# Patient Record
Sex: Female | Born: 1945 | Race: White | Hispanic: No | Smoking: Former smoker
Health system: Southern US, Community
[De-identification: ages and names within clinical notes are randomized; demographics above are authoritative.]

## PROBLEM LIST (undated history)

## (undated) DIAGNOSIS — N189 Chronic kidney disease, unspecified: Secondary | ICD-10-CM

## (undated) DIAGNOSIS — I10 Essential (primary) hypertension: Secondary | ICD-10-CM

## (undated) DIAGNOSIS — E785 Hyperlipidemia, unspecified: Secondary | ICD-10-CM

## (undated) DIAGNOSIS — F32A Depression, unspecified: Secondary | ICD-10-CM

## (undated) DIAGNOSIS — F329 Major depressive disorder, single episode, unspecified: Secondary | ICD-10-CM

## (undated) DIAGNOSIS — E119 Type 2 diabetes mellitus without complications: Secondary | ICD-10-CM

## (undated) DIAGNOSIS — I2510 Atherosclerotic heart disease of native coronary artery without angina pectoris: Secondary | ICD-10-CM

## (undated) DIAGNOSIS — M1990 Unspecified osteoarthritis, unspecified site: Secondary | ICD-10-CM

## (undated) DIAGNOSIS — I219 Acute myocardial infarction, unspecified: Secondary | ICD-10-CM

## (undated) HISTORY — PX: OVARIAN CYST REMOVAL: SHX89

## (undated) HISTORY — PX: TUBAL LIGATION: SHX77

## (undated) HISTORY — DX: Essential (primary) hypertension: I10

## (undated) HISTORY — PX: EYE SURGERY: SHX253

## (undated) HISTORY — PX: ABDOMINAL HYSTERECTOMY: SHX81

## (undated) HISTORY — PX: CHOLECYSTECTOMY: SHX55

---

## 2000-12-23 DIAGNOSIS — I219 Acute myocardial infarction, unspecified: Secondary | ICD-10-CM

## 2000-12-23 HISTORY — DX: Acute myocardial infarction, unspecified: I21.9

## 2000-12-23 HISTORY — PX: CORONARY ARTERY BYPASS GRAFT: SHX141

## 2001-05-17 ENCOUNTER — Encounter (INDEPENDENT_AMBULATORY_CARE_PROVIDER_SITE_OTHER): Payer: Self-pay | Admitting: Specialist

## 2001-05-17 ENCOUNTER — Inpatient Hospital Stay (HOSPITAL_COMMUNITY): Admission: EM | Admit: 2001-05-17 | Discharge: 2001-05-26 | Payer: Self-pay

## 2001-05-19 ENCOUNTER — Encounter: Payer: Self-pay | Admitting: Cardiothoracic Surgery

## 2001-05-20 ENCOUNTER — Encounter: Payer: Self-pay | Admitting: Cardiothoracic Surgery

## 2001-05-21 ENCOUNTER — Encounter: Payer: Self-pay | Admitting: Cardiothoracic Surgery

## 2001-05-21 HISTORY — PX: CAROTID ENDARTERECTOMY: SUR193

## 2001-05-22 ENCOUNTER — Encounter: Payer: Self-pay | Admitting: Cardiothoracic Surgery

## 2001-05-23 ENCOUNTER — Encounter: Payer: Self-pay | Admitting: Cardiothoracic Surgery

## 2001-05-24 ENCOUNTER — Encounter: Payer: Self-pay | Admitting: Cardiothoracic Surgery

## 2001-05-25 ENCOUNTER — Encounter: Payer: Self-pay | Admitting: Cardiothoracic Surgery

## 2001-06-30 ENCOUNTER — Encounter (HOSPITAL_COMMUNITY): Admission: RE | Admit: 2001-06-30 | Discharge: 2001-07-21 | Payer: Self-pay | Admitting: Interventional Cardiology

## 2001-07-22 ENCOUNTER — Encounter (HOSPITAL_COMMUNITY): Admission: RE | Admit: 2001-07-22 | Discharge: 2001-10-20 | Payer: Self-pay | Admitting: Interventional Cardiology

## 2001-10-13 ENCOUNTER — Other Ambulatory Visit: Admission: RE | Admit: 2001-10-13 | Discharge: 2001-10-13 | Payer: Self-pay | Admitting: Obstetrics and Gynecology

## 2001-12-23 HISTORY — PX: CORONARY ANGIOPLASTY WITH STENT PLACEMENT: SHX49

## 2004-03-27 ENCOUNTER — Emergency Department (HOSPITAL_COMMUNITY): Admission: AD | Admit: 2004-03-27 | Discharge: 2004-03-27 | Payer: Self-pay | Admitting: Emergency Medicine

## 2004-06-20 ENCOUNTER — Ambulatory Visit (HOSPITAL_COMMUNITY): Admission: RE | Admit: 2004-06-20 | Discharge: 2004-06-20 | Payer: Self-pay | Admitting: Gastroenterology

## 2004-07-02 ENCOUNTER — Observation Stay (HOSPITAL_COMMUNITY): Admission: EM | Admit: 2004-07-02 | Discharge: 2004-07-02 | Payer: Self-pay

## 2004-07-12 ENCOUNTER — Inpatient Hospital Stay (HOSPITAL_BASED_OUTPATIENT_CLINIC_OR_DEPARTMENT_OTHER): Admission: RE | Admit: 2004-07-12 | Discharge: 2004-07-12 | Payer: Self-pay | Admitting: Interventional Cardiology

## 2004-07-18 ENCOUNTER — Ambulatory Visit (HOSPITAL_COMMUNITY): Admission: RE | Admit: 2004-07-18 | Discharge: 2004-07-19 | Payer: Self-pay | Admitting: Interventional Cardiology

## 2008-06-23 ENCOUNTER — Emergency Department (HOSPITAL_COMMUNITY): Admission: EM | Admit: 2008-06-23 | Discharge: 2008-06-23 | Payer: Self-pay | Admitting: Emergency Medicine

## 2011-05-10 NOTE — Cardiovascular Report (Signed)
Hailey Levine, Hailey Levine                      ACCOUNT NO.:  [id number]   MEDICAL RECORD NO.:  [id number]                   PATIENT TYPE:  OIB   LOCATION:  2899                                 FACILITY:  [facility]   PHYSICIAN:  Lyn Records III, M.D.            DATE OF BIRTH:  [birth year]   DATE OF PROCEDURE:  07/18/2004  DATE OF DISCHARGE:                              CARDIAC CATHETERIZATION   INDICATIONS:  Significant midcircumflex disease, history of prior coronary  artery bypass grafting with an abnormal Cardiolite demonstrating  inferoapical ischemia in the distribution of the obstructed vessel.   PROCEDURE:  1. Left heart catheterization.  2. Cypher-drug coated stent implantation in the midcircumflex.   DESCRIPTION OF PROCEDURE:  After informed consent, a 6 French sheath was  placed in the right femoral artery using a modified Seldinger technique.  A  6 French Judkins' #3 left guide catheter was used.  We attempted to use a  CLS guide but even a CLS 3.0 was too large for the patient's aortic root.  We gave a bolus of Angiomax followed by an infusion.  The patient had been  pretreated for four days with Plavix.  We then performed percutaneous  intervention using a Asahi medium wire.  Predilatation was performed with a  9 mm long by 2.5 mm diameter Maverick balloon.  We then had difficulty  getting a 2.5 by 13 Cypher stent to properly position in the circumflex.  We  had to insert a Asahi wire (prowater) which allowed a good further entry  into the tortuous distal vessel.  With this buddy wire, we were then able to  insert the stent and deploy it to 11 atmospheres x 2 with a good final  angiographic result and maintenance of some TIMI grade III flow.   Angiomax was discontinued postprocedure.   CONCLUSIONS:  Successful stent of the midcircumflex from 75% to 0% with  maintenance of TIMI grade III flow.   PLAN:  Angiomax has been discontinued.  Plavix will be continued for a  year.  Discharge on July 19, 2004.                                               Lesleigh Noe, M.D.    HWS/MEDQ  D:  07/18/2004  T:  07/18/2004  Job:  [id number]   cc:   Molly Maduro L. Foy [place], M.D.  [address] [place]  [place] [address]  Fax: [phone]   Mikey Bussing, M.D.  [address]  [place]  [place] [address]   Meredith Staggers, M.D.  [address]  [place]  [place] [address]  Fax: [phone]

## 2011-05-10 NOTE — Discharge Summary (Signed)
[place]. [facility]  Patient:    Hailey Levine, Hailey Levine                     MRN: [id number] Adm. Date:  05/17/01 Disc. Date: 05/26/01 Attending:  Mikey Bussing, M.D. Dictator:   Lissa Merlin, P.A. CC:         Quita Skye. Hart Rochester, M.D.  Marinda Elk, M.D.  Darci Needle, M.D.   Discharge Summary  DATE OF BIRTH:  1946/10/04  ADMISSION DIAGNOSES: 1. Unstable angina, class IV. 2. Non-Q wave myocardial infarction.  DISCHARGE DIAGNOSES: 1. Unstable angina, class IV. 2. Non-Q wave myocardial infarction. 3. Severe three vessel coronary artery disease, ejection fraction at 35%. 4. Congestive heart failure. 5. 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 6. Hypotension, resolved. 7. Mild postoperative anemia. 8. Mild peripheral vascular disease per Doppler.  PAST MEDICAL HISTORY: 1. (No previous history of CAD). 2. Hypertension. 3. Hyperlipidemia. 4. Hyperglycemia. 5. 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  BRIEF HISTORY:  The patient is a 65 year old obese white female with no previous history of CAD, who originally presented with symptoms of unstable angina to a walk-in clinic on May 17, 2001.  At the clinic she was noted to have EKG changes.  She was set up to be admitted to the hospital, but before she was admitted, her symptoms became worse and she was sent to the emergency room.  HOSPITAL COURSE:  At the emergency room, she was stabilized by cardiology. She was ruled in for non-Q wave MI.  She was stabilized on Integrilin, heparin, nitroglycerin, and aspirin.  She underwent a cardiac catheterization on May 18, 2001, and this showed severe three vessel coronary artery disease. Ejection fraction was 35% with severe anterior and apical hypokinesis.  CVTS was consulted.  She had pre-CABG Dopplers which showed severe left ICA stenosis greater than 80%.  Vascular service was also alert.   Dr. Arbie Cookey initially evaluated Ms. Schlesinger.  After consulting with Dr. Donata Clay who had also reviewed the catheterization data, it was agreed to proceed with combined CABG and left carotid endarterectomy.  Prior to surgery she had a two-dimensional echocardiogram to check ventricular function and to check for any mitral regurgitation.  There was none seen.  Meanwhile Dr. Hart Rochester also reviewed carotid Duplex scan and agreed to proceed with surgery.  Risk, benefits, details, and alternatives of combined procedure were discussed with the patient by Dr. Donata Clay and Dr. Hart Rochester and it was agreed to proceed.  She underwent combined left carotid endarterectomy with Dacron patch angioplasty and CABG x 3 on May 21, 2001.  There were no complications and she was taken to SICU in stable condition.  Postoperatively she did well with routine care. She was deemed suitable for transfer to unit 2000 on postoperative day #2, however, there were no beds.  She was transferred to unit 2000 on postoperative day #4.  She had been working with cardiac rehabilitation.  She remained neurologically intact.  By postoperative day #5 on May 26, 2001, she was doing very well, walking about the room.  She had no complaints and said she felt good.  She was afebrile, vital signs were stable.  She was in normal sinus rhythm.  She was almost back to her preoperative weight.  Physical examination was satisfactory.  Wounds were healing well.  She was neurologically intact.  Bowel and bladder were functioning well.  Discharge  instructions were reviewed and she was subsequently discharged home.  PROCEDURE: 1. Cardiac catheterization on May 18, 2001. 2. Pre-CABG Dopplers on May 18, 2001, showing severe greater than 80% left    ICA stenosis and mild lower extremity peripheral vascular disease. 3. Two-dimensional echocardiogram on May 19, 2001. 4. Combined left carotid endarterectomy and CABG x 3 with saphenuos vein graft     to diagonal, saphenuos vein graft to RCA, saphenuos vein graft to OM on May 21, 2001.  DISCHARGE INSTRUCTIONS:  She was told no driving, no lifting more than 10 pounds, no strenuous activity.  Use her incentive spirometer daily.  Walk daily.  She can shower.  She is to maintain a low fat, cholesterol, low sodium, no-concentrated sweets, diabetic diet.  She is to use soap and water on her wounds and to observe daily for increasing redness, swelling, drainage, or fever.  She was to get a chest x-ray at Dr. Lonn Georgia office and to bring it with her to see Dr. Donata Clay.  DISCHARGE MEDICATIONS:  1. Lopressor 50 mg 1/2 tablet p.o. b.i.d.  2. Lasix 40 mg one p.o. q.d. x 7 days.  3. KCL 20 mEq one p.o. q.d. x 7 days.  4. Enteric-coated aspirin 325 mg one p.o. q.d.  5. Niferex 150 one p.o. q.d.  6. Folate 1 mg one p.o. q.d.  7. Darvocet-N 100 one to two p.o. q.4-6h. p.r.n. for pain.  8. Premarin 0.625 mg one p.o. q.d.  9. Lipitor 10 mg one p.o. q.d. 10. Altace 2.5 mg one p.o. q.d.  ALLERGIES:  CODEINE causes nausea.  CONDITION ON DISCHARGE:  Stable.  FOLLOW-UP: 1. Dr. Katrinka Blazing two weeks after discharge. 2. Dr. Donata Clay three weeks after discharge. 3. Dr. Hart Rochester three weeks after discharge.  Office was to call with    appointments. 4. Staples and chest tube sutures out one week after discharge. DD:  06/22/01 TD:  06/22/01 Job: [id number]

## 2011-05-10 NOTE — Op Note (Signed)
[place]. [facility]  Patient:    Hailey Levine, Hailey Levine                     MRN: [id number] Proc. Date: 05/21/01 Attending:  Quita Skye. Hart Rochester, M.D.                           Operative Report  PREOPERATIVE DIAGNOSIS:  Severe asymptomatic left internal carotid stenosis and three-vessel coronary artery disease with previous unstable angina.  POSTOPERATIVE DIAGNOSIS:  Severe asymptomatic left internal carotid stenosis and three-vessel coronary artery disease with previous unstable angina.  OPERATION PERFORMED:  Left carotid endarterectomy with Dacron patch angioplasty.  SURGEON:  Quita Skye. Hart Rochester, M.D.  ASSISTANT:  Lissa Hoard, P.A.  ANESTHESIA:  General endotracheal.  INDICATIONS FOR PROCEDURE:  This patient was admitted with unstable angina pectoris and was evaluated for coronary artery bypass grafting after stabilization.  She was also found to have an 80 to 90% left internal carotid artery stenosis which was asymptomatic and minimal bruits on the right side. She was scheduled for a combined left carotid endarterectomy and coronary artery bypass grafting procedure.  This is for the carotid portion.  DESCRIPTION OF PROCEDURE:  The patient was taken to the operating room and placed in supine position at which time satisfactory general endotracheal anesthesia was administered.  The neck, chest, abdomen and lower extremities were prepped with Betadine scrub and solution and draped in routine sterile manner.  Incision was made in the left neck along the anterior border of the sternocleidomastoid muscle and carried down through subcutaneous tissues and platysma using a Bovie.  The common facial vein and external jugular veins were ligated with 3-0 silk ties and divided, exposing the common, internal and external carotid arteries.  Care was not to enter the vagus or hypoglossal nerves, both of which were exposed.  There was calcified atherosclerotic plaque at the  carotid bifurcation extending up the internal carotid artery posteriorly about 4 to 5 cm with the distal vessel appearing normal.  A #10 shunt was prepared and the patient was heparinized.  The carotid vessels were occluded with the vascular clamps.  Longitudinal opening made in the common carotid with a 15 blade and extended up the internal carotid artery with the Potts scissors to a point distal to the disease.  The plaque was about 90% stenotic in severity and very ulcerative.  A #10 shunt was inserted without difficulty, re-establishing flow in about two minutes.  A standard endarterectomy was then performed using elevator and Potts scissors with an eversion endarterectomy of the external carotid artery.  The plaque  feathered off the distal internal carotid artery nicely not requiring any tacking sutures.  The lumen was thoroughly irrigated with heparin saline and all loose debris carefully removed.  arteriotomy was closed with patch using continuous 6-0 Prolene.  Prior to completion of the closure, the shunt was removed after about 30 minutes of shunt time and following antegrade flushing.  The closure was completed with re-establishment of flow initially up the external and up the internal branch.  Carotid occluded for less than two minutes for removal of the shunt.  Protamine was not given at this point in the procedure since Dr. Donata Clay was to then proceed with the coronary artery bypass grafting and at the conclusion of that Dr. Donata Clay closed the left neck in the usual fashion leaving a Jackson-Pratt drain in the depth of the wound. DD:  05/21/01 TD:  05/21/01 Job: [id number]

## 2011-05-10 NOTE — Op Note (Signed)
[place]. [facility]  Patient:    Hailey Levine, Hailey Levine                   MRN: [id number] Proc. Date: 05/21/01 Adm. Date:  [id number] Attending:  Dyanne Carrel CC:         Darci Needle, M.D.  CVTS office  Dyanne Carrel, M.D.   Operative Report  PREOPERATIVE DIAGNOSIS:  Postinfarction, unstable angina, severe three-vessel coronary disease, reduced left ventricular function, 80 to 90% left carotid stenosis.  POSTOPERATIVE DIAGNOSIS:  Postinfarction, unstable angina, severe three-vessel coronary disease, reduced left ventricular function, 80 to 90% left carotid stenosis.  OPERATION PERFORMED:  Coronary artery bypass grafting x 3 with simultaneous left carotid endarterectomy (saphenous vein graft to diagonal, saphenous vein graft to right coronary artery, saphenous vein graft to obtuse marginal).  SURGEON:  Mikey Bussing, M.D.  ASSISTANT: 1. Quita Skye. Hart Rochester, M.D. 2. Lissa Hoard, P.A.  ANESTHESIA:  General.  ANESTHESIOLOGIST:  Kaylyn Layer. Michelle Piper, M.D.  INDICATIONS FOR PROCEDURE:  This patient is a 65 year old obese white female who presented with symptoms of unstable angina, ruled in for myocardial infarction.  She developed signs of congestive heart failure following a non-Q-wave myocardial infarction and was treated with inotropic support following cardiac catheterization which documented severe three-vessel disease with reduced left ventricular function.  A 2-D echo was performed preoperatively which showed no significant mitral regurgitation and global hypokinesia.  A Swan-Ganz catheter was placed preoperatively and her pulmonary pressures were 40/20 with cardiac output of 5L per minute on low-dose dopamine and dobutamine.  Because of her three-vessel disease and reduced left ventricular function, she was referred for surgical coronary revascularization.  Preoperative evaluation also determined the left carotid artery to  have an 80 to 90% stenosis.  She was referred for coronary artery bypass surgery and left carotid endarterectomy.  The carotid endarterectomy procedure was performed simultaneously with the coronary bypass operation. Carotid endarterectomy operation was performed by Dr. Jerilee Field, who will dictate the procedure in a separate note.  After I assisted Dr. Hart Rochester with the carotid endarterectomy, the neck was packed and we proceeded with coronary artery bypass operation.  Prior to the operation, I examined the patient in the intensive care unit and reviewed results of the heart cath and echo with the patient and her husband. I discussed the indications and expected benefits of coronary artery bypass surgery.  I reviewed the major aspects of the operation including the choice of conduit, the location of the surgical incisions, use of general anesthesia and cardiopulmonary bypass, the expected postoperative recovery period.  I discussed the specific risks associated with this operation with the patient including the risks of myocardial infarction, cerebrovascular accident, bleeding, infection and death.  She understood that there were alternatives to surgical therapy for her coronary artery disease and left carotid stenosis and she understood the expected benefits and risks of those alternative therapies. She agreed to proceed with the operation as stated and under informed consent after reviewing these aspects of the procedure.  OPERATIVE FINDINGS:  The patients body habitus being short and obese with a very large heart made exposure of the cardiac vessels difficult. The aorta was cannulated underneath the innominate vein.  The aorta was short as well. The saphenous vein was of average quality and it was harvested from both lower legs.  The LAD was a small atretic and nongraftable vessel.  The mammary artery was exposed but not used due to  a small diameter, thready pulse, and sclerosis of  the vessel.  The patient would not be a candidate for redo bypass surgery based on the difficulty exposing the heart and the mediastinum.  DESCRIPTION OF PROCEDURE:  The patient was brought to the operating room and placed supine on the operating table where general anesthesia was induced under invasive hemodynamic monitoring.  The chest, abdomen and legs were prepped with Betadine and draped as a sterile field.  The left neck was prepped and draped for a carotid endarterectomy.  The endarterectomy procedure was then performed by Dr. Hart Rochester which will be dictated in a separate note. The neck wound was packed and left open.  A sternal incision was then made as the saphenous vein was harvested from both lower extremities below the knees. A sternal retractor was placed and heparin was administered.  The ACT was documented as being therapeutic.  Through pursestrings placed in the ascending aorta and right atrium, the patient was cannulated and placed on bypass and cooled to 32 degrees.  The coronaries were identified and the diagonal, OM, and right coronary artery were found to be adequate vessels for grafting.  The LAD was small and nongraftable.  The saphenous veins were prepared for the distal anastomoses and a cardioplegia cannula was placed in the ascending aorta.  The patient was cooled to 28 degrees and as the aortic crossclamp was applied, 650 cc of cold blood cardioplegia was delivered to the aortic root with immediate cardioplegic arrest and septal temperature dropping less than 12 degrees.  Topical iced saline slush was used to augment myocardial preservation and a pericardial insulator pad was used to protect the left phrenic nerve.  The distal coronary anastomoses were then performed.  The first distal anastomosis was to the distal right.  It was a 1.8 mm vessel with proximal 90% stenosis and a reversed saphenous vein was sewn end-to-side  with running 7-0  Prolene with good  flow through the graft.  The second distal anastomosis was to the diagonal.  This was a smaller 1.4 mm vessel with proximal 90 to 95% stenosis.  A saphenous vein was sewn end-to-side with running 7-0 Prolene with good flow through the graft.  The third distal anastomosis was to the obtuse marginal which was a 1.5 mm vessel with proximal 90% stenosis.  A reversed saphenous vein was sewn end-to-side with running 7-0 Prolene with good flow through the graft.  The patient was then rewarmed and a dose of warm blood cardioplegia was given through the vein grafts.  The crossclamp was then removed and the heart resumed a spontaneous rhythm. Using a partial occlusion clamp on the ascending aorta, three proximal vein anastomoses were performed using a 4.0 mm punch and running 6-0 Prolene.  The partial clamp was removed and the vein grafts were perfused.  Each had excellent flow and hemostasis was documented in the proximal and distal anastomoses.  The patient was rewarmed to  37 degrees and temporary pacing wires were applied.  The lungs were re-expanded and the ventilator was resumed.  The patient was weaned from cardiopulmonary bypass on low dose dopamine and milrinone with stable cardiac output and blood pressure. Protamine was administered and the cannulas were removed.  The mediastinum was irrigated with warm antibiotic irrigation.  The leg incision was irrigated and closed in a standard fashion.  The pericardium was loosely closed superiorly over the aorta and vein grafts.  Two mediastinal chest tubes were placed and brought out through separate incisions.  The sternum was reapproximated with interrupted steel wire.  The pectoralis fascia was closed with interrupted #1 Vicryl.  The subcutaneous and skin were closed with running Vicryl.  The neck incision was then  irrigated and closed over a Jackson-Pratt drain in two layers using Vicryl.  The patient then returned to the ICU in  stable condition.  Total bypass time was 120 minutes with aortic crossclamp time of 45 minutes. DD:  05/21/01 TD:  05/21/01 Job: [id number]

## 2011-05-10 NOTE — H&P (Signed)
Hailey Levine, Hailey Levine                      ACCOUNT NO.:  [id number]   MEDICAL RECORD NO.:  [id number]                   PATIENT TYPE:  OBV   LOCATION:  3737                                 FACILITY:  [facility]   PHYSICIAN:  Lyn Records, M.D.                DATE OF BIRTH:  1946/05/14   DATE OF ADMISSION:  07/02/2004  DATE OF DISCHARGE:                                HISTORY & PHYSICAL   HISTORY OF PRESENT ILLNESS:  Hailey Levine is a 65 year old white woman  who is admitted to [facility] for further evaluation of chest pain.   The patient has a history of cardiac disease which dates back to 2002.  At  that time, she presented with a non-Q-wave myocardial infarction.  She  subsequently underwent coronary artery bypass surgery, receiving a saphenous  vein graft to the diagonal, a saphenous vein graft to the right coronary  artery, and a saphenous vein graft to the obtuse marginal.  She underwent a  simultaneous left carotid endarterectomy.  She was noted to have an ischemic  cardiomyopathy with an ejection fraction of approximately 35%.   Her course has been uncomplicated since that time.  She has not experienced  episodes of congestive heart failure, nor has she experienced recurrent  chest pain.   The patient presented to the emergency room this evening after experiencing  chest discomfort which began mid-afternoon today.  This was actually  epigastric discomfort.  She experienced an epigastric burning which lasted  for most of the day.  There was also some element of burning located in the  left inframammary area.  She took a combination of Pepcid AC, aspirin, and  nitroglycerin which seemed to relieve her symptoms later this evening.  The  total duration of chest discomfort was approximately 6 hours.  She is free  of chest discomfort at this time.  There was no dyspnea, diaphoresis, or  nausea.  There were no exacerbating or ameliorating factors.  It appeared  not  to be related to position, activity, meals, or respirations.   The patient has a history of hypertension, dyslipidemia, and borderline  adult-onset diabetes mellitus.  There is no history of smoking.  There is a  strong family history of coronary artery disease; her father died of a  myocardial infarction in his 69s.   PAST MEDICAL HISTORY:  In addition to the aforementioned problems, the  patient has a history of gastroesophageal reflux and gout.   ALLERGIES:  She is not allergic to any medications.   CURRENT MEDICATIONS:  Her current medications include Metoprolol, Lipitor,  Lotrel, and aspirin.  It should be noted that the patient stopped taking her  Protonix 10 days ago.   PAST SURGICAL HISTORY:  In addition to the coronary artery bypass surgery  and carotid endarterectomy, she has also undergone hysterectomy,  cholecystectomy, multiple operations on her toes.  She underwent a  colonoscopy and endoscopy  last week; this reportedly demonstrated a small  hiatal hernia.   FAMILY HISTORY:  Family history is notable for early coronary artery disease  in her father.  Her mother is alive and well.  There are no problems in  multiple siblings.   SIGNIFICANT INJURIES:  None.   SOCIAL HISTORY:  The patient neither smokes nor drinks.  She lives with her  husband.  She does not work.   REVIEW OF SYSTEMS:  Review of systems reveals no new problems related to her  head, eyes, ears, nose, mouth, throat, lungs, gastrointestinal system,  genitourinary system, or extremities.  There is no history of neurologic or  psychiatric disorder.  There is no history of fever, chills, or weight loss.   PHYSICAL EXAMINATION:  VITAL SIGNS:  Blood pressure 157/58.  Pulse 77 and  regular.  Respirations 19.  Temperature 97.8.  GENERAL:  The patient was an obese, middle-aged white woman in no  discomfort.  She was alert, oriented, appropriate, and responsive.  HEENT:  Head, eyes, nose, and mouth were  normal.  NECK:  The neck was without thyromegaly or adenopathy.  Carotid pulses were  palpable bilaterally and without bruits.  CARDIAC:  Examination revealed a normal S1 and S2.  There was no S3, S4,  murmur, rub, or click.  Cardiac rhythm was regular.  No chest wall  tenderness was noted.  LUNGS:  The lungs were clear.  ABDOMEN:  The abdomen was soft and nontender.  There was no mass,  hepatosplenomegaly, bruit, distention, rebound, guarding, or rigidity.  Bowel sounds were normal.  BREAST, PELVIC AND RECTAL:  Examinations were not performed as they were not  pertinent to the reason for acute care hospitalization.  EXTREMITIES:  The extremities were without edema, deviation, or deformity.  Radial and dorsalis pedal pulses were palpable bilaterally.  NEUROLOGIC:  Brief screening neurologic survey was unremarkable.   LABORATORY AND ACCESSORY CLINICAL DATA:  The electrocardiogram revealed  normal sinus rhythm.  There was mild T wave inversion in leads aVL, V2 and  V3 with T wave flattening in the anterolateral leads.   The chest radiograph, according to the radiologist, demonstrated no evidence  of infiltrate or congestive heart failure; there was some subsegmental  atelectasis.   The initial set of cardiac markers revealed a myoglobin of 89.4, CK-MB 1.8,  and troponin less than 0.05.  A repeated set of biochemistry studies,  obtained because the first set was hemolyzed, revealed a potassium of 4.3,  BUN 16, and creatinine 1.1.  The remaining studies were pending at the time  of this dictation.   IMPRESSION:  1. Atypical chest pain, rule out cardiac ischemia.  The discomfort is     epigastric and inframammary in location and burning in quality.  2. Coronary artery disease.  Status post non-Q-wave myocardial infarction in     2002 resulting in coronary artery bypass surgery with a saphenous vein    graft to the diagonal, saphenous vein graft to the right coronary artery,     and  saphenous vein graft to the obtuse marginal.  3. Ischemic cardiomyopathy.  Ejection fraction 35%.  4. Status post left carotid endarterectomy at the same time as the     aforementioned coronary artery bypass surgery.  5. Hypertension.  6. Dyslipidemia.  7. Gastroesophageal reflux.  8. Gout.  9. Borderline adult-onset diabetes mellitus.   PLAN:  1. Telemetry.  2. Serial cardiac enzymes.  3. Aspirin.  4. Heparin.  5. Nitrol paste.  6. Restart Protonix.  7. Further measures per Dr. Lyn Records.      Quita Skye. Waldon Reining, MD                   Lyn Records, M.D.    MSC/MEDQ  D:  07/02/2004  T:  07/02/2004  Job:  [id number]   cc:   Lyn Records III, M.D.  [address]  [address]  [place]  [place] [address]  Fax: [phone]

## 2011-05-10 NOTE — Op Note (Signed)
Hailey Levine, Hailey Levine                      ACCOUNT NO.:  [id number]   MEDICAL RECORD NO.:  [id number]                   PATIENT TYPE:  AMB   LOCATION:  ENDO                                 FACILITY:  [facility]   PHYSICIAN:  John C. Madilyn Fireman, M.D.                 DATE OF BIRTH:  [birth year]   DATE OF PROCEDURE:  06/20/2004  DATE OF DISCHARGE:                                 OPERATIVE REPORT   PROCEDURE:  Esophagogastroduodenoscopy.   INDICATIONS FOR PROCEDURE:  Chronic reflux symptoms in a patient who is also  undergoing a screening colonoscopy today.   PROCEDURE:  The patient was placed in the left lateral decubitus position  and placed on the pulse monitor with continuous low flow oxygen delivered by  nasal cannula.  She was sedated with 50 mcg IV fentanyl and 5 mg IV Versed.  The Olympus video endoscope was advanced under direct vision into the  oropharynx and esophagus.  The esophagus was straight and of normal caliber  with the squamocolumnar line at 38 cm above a 1.5 cm sliding hiatal hernia.  There was no visible ring, stricture, or abnormality of the GE junction.  The stomach was entered, and a small amount of liquid secretions were  suctioned from the fundus.  Retroflexed view of the cardia was unremarkable.  The fundus, body, antrum, and pylorus all appeared normal.  The duodenum was  entered.  Both bulb and second portions were well inspected and appeared to  be within normal limits.  The scope was then withdrawn, and the patient was  prepared for colonoscopy.  She tolerated the procedure well, and there were  no immediate complications.   IMPRESSION:  Small hiatal hernia.   PLAN:  We will proceed with colonoscopy and continue medical treatment for  gastroesophageal reflux.                                               John C. Madilyn Fireman, M.D.    JCH/MEDQ  D:  06/20/2004  T:  06/20/2004  Job:  [id number]   cc:   Molly Maduro L. Foy Guadalajara, M.D.  [address] [place]  [place]  [address]  Fax: [phone]

## 2011-05-10 NOTE — Op Note (Signed)
Hailey Levine, GUYETTE                      ACCOUNT NO.:  [id number]   MEDICAL RECORD NO.:  [id number]                   PATIENT TYPE:  AMB   LOCATION:  ENDO                                 FACILITY:  [facility]   PHYSICIAN:  John C. Madilyn Fireman, M.D.                 DATE OF BIRTH:  [birth year]   DATE OF PROCEDURE:  06/20/2004  DATE OF DISCHARGE:                                 OPERATIVE REPORT   PROCEDURE:  Colonoscopy.   INDICATION FOR PROCEDURE:  Colon cancer screening in a 65 year old patient  with no recent screening.   PROCEDURE:  The patient was placed in the left lateral decubitus position  and placed on the pulse monitor with continuous low flow oxygen delivered by  nasal cannula.  She was sedated with 25 mcg IV fentanyl and 3 mg IV Versed  additionally with medicine given for the previous EGD.  Olympus video  colonoscope was inserted into the rectum and advanced to the cecum,  confirmed by transillumination of McBurney's point and visualization of the  ileocecal valve and appendiceal orifice.  The prep was excellent.  The  cecum, ascending, transverse, descending and sigmoid colon all appeared  normal with no masses, polyps, diverticula or other mucosal abnormalities.  The rectum likewise appeared normal and retroflexed view the anus revealed  only small internal hemorrhoids.  The scope was then withdrawn and the  patient returned to the recovery room in stable condition.  She tolerated  the procedure well and there were no immediate complications.   IMPRESSION:  Small internal hemorrhoids, otherwise normal colonoscopy.   PLAN:  Next colon screening by sigmoidoscopy in five years.                                               John C. Madilyn Fireman, M.D.    JCH/MEDQ  D:  06/20/2004  T:  06/20/2004  Job:  [id number]   cc:   Molly Maduro L. Foy Guadal[place]ara, M.D.  [address] [place]  [place] [address]  Fax: [phone]

## 2011-05-10 NOTE — H&P (Signed)
[place]. [facility]  Patient:    Hailey Levine, Hailey Levine                   MRN: [id number] Adm. Date:  [id number] Attending:  Dyanne Carrel CC:         Darci Needle, M.D.   History and Physical  DATE OF BIRTH:  1946-11-29. PRIMARY CARE PHYSICIAN:  Marinda Elk, M.D. CARDIOLOGIST:  Darci Needle, M.D.  HISTORY OF PRESENT ILLNESS:  A 65 year old married white female developed intermittent chest pain episodes beginning Tuesday, May 21.  These were left-sided, burning chest pain with radiation in the left arm and hand.  Each lasted only about five minutes and seemed to get better with Tums.  At approximately 4 p.m. on day of admission, patient had a stronger and more prolonged spell of chest pain and presented to [facility] walk-in clinic.  She had no associated shortness of breath or palpitations but did say she felt like she was breaking a sweat.  Pain resolved while at the walk-in clinic, but due to some nonspecific anterior T-wave changes, she was sent in for direct admit.  Prior to leaving walk-in clinic, she developed more pain and was therefore sent to ER for acute evaluation.  PAST MEDICAL HISTORY:  Risk factors:  No prior cardiac disease.  Has had hypertension for about 10 years.  Recent elevated glucose of about 140 fasting, with patient on diet to control this.  Cholesterol was borderline or high normal in office two months ago.  Father died of MI at 69.  Patient quit smoking December 16, 1999.  MEDICATIONS:  Ortho-Est 1.25 mg q.d., Micardis HCT 80/12.5 mg q.d., phentermine 37.5 mg q.d., took for 10 days but stopped at onset of pain.  PAST SURGICAL HISTORY:  Cholecystectomy in 1994, hysterectomy followed by tummy tuck same admission in 1982, tubal pregnancy in 1966, ovarian cystectomy in 1964.  GYNECOLOGIC HISTORY:  Gravida 5, AB 5.  Last menstrual period 1982.  SOCIAL HISTORY:  Housewife.  Smoked from age 5 to about 79.   Denies ETOH use other than rare strawberry daiquiri.  Does drink lots of diet coke.  FAMILY HISTORY:  Father was born with "leaky valve," died at 7 of MI.  Mother is living at 19 with diabetes and hypertension.  Patient has two sisters, who are alive and well.  No offspring.  REVIEW OF SYSTEMS:  Patient denies headache, ear pain, loss of hearing, blurred vision, double vision, or eye pain.  She denies any difficulty swallowing, has had some periodontal disease.  Denies cough, shortness of breath, or other pulmonary symptoms.  Denies abdominal pain, nausea, vomiting constipation, diarrhea, or GI bleed.  Has had consistently normal Hemoccult tests.  Denies dysuria.  Has intentional polydipsia and polyuria since she has been on Atkins diet.  Denies joint or muscle pains.  Denies skin problems, depression, or anxiety.  PHYSICAL EXAMINATION:  VITAL SIGNS:  Temperature 98.6, pulse 76, respirations 16, blood pressure 143/77.  GENERAL:  Patient is pleasant, comfortable, and pain-free at time of exam. Did complain of some burning in her tailbone from sitting on the uncomfortable stretcher.  HEENT:  ENT exam:  Normal.  Eyes:  EOMI, PERRL.  Lenses clear.  Fundi not well-seen.  NECK:  Without JVD, thyromegaly, or bruits.  CHEST:  No chest wall tenderness.  Lungs are clear to auscultation.  BREASTS:  No masses.  CARDIAC:  Heart rate regular.  No murmur, rub,  or gallop.  Normal S1, S2.  ABDOMEN:  Soft, nontender, with no mass.  There is a transverse lower abdominal scar around which there is some numbness.  There are laparoscopic scars from cholecystectomy.  PELVIC:  Deferred.  RECTAL:  Deferred.  PULSES:  2+ dorsalis pedis pulses.  Normal carotid and inguinal pulses.  SKIN:  Free of suspicious lesions.  LABORATORY DATA:  EKG at walk-in clinic had shown some T-wave inversion in V2 and V3.  EKG at ER showed some ST elevation in V2 and V3, but neither pattern was specific.  White  blood count 15,000, glucose 144.  Remainder of CBC and CMET normal.  Cardiac enzymes:  CPK 107, MB 5.5%, index 5.1%, troponin I at 0.63%.  ASSESSMENT: 1. Unstable angina with probable subendocardial myocardial infarction. 2. Treated hypertension. 3. Mild diabetes of recent onset. 4. Overweight condition.  PLAN:  Discussed case with Dr. Katrinka Blazing.  Integrilin, heparin, and nitroglycerin started.  Aspirin already given per EDP.  Further workup and care to be per cardiologist. DD:  05/17/01 TD:  05/18/01 Job: [id number]

## 2011-05-10 NOTE — Consult Note (Signed)
[place]. [facility]  Patient:    Hailey Levine, Hailey Levine                  MRN: [id number] Proc. Date: 05/17/01 Dictator:   Darci Needle, M.D. CC:         Marinda Elk, M.D.   Consultation Report  INDICATION FOR CONSULTATION:  Chest discomfort.  CONCLUSIONS: 1. Acute coronary syndrome with suspected anterior wall ischemia based on EKG. 2. Hypertension for greater than 10 years, currently on medical therapy. 3. Hyperglycemia, recently diagnosed.  RECOMMENDATIONS: 1. Aspirin and Plavix. 2. IV nitroglycerin. 3. Integrilin. 4. Beta-blocker therapy. 5. Serial enzymes and EKGs. 6. Coronary angiography on Monday or Tuesday depending upon hospital schedule. 7. We will take the patient on our service as this appears to be pretty much    straightforward cardiac disease.  COMMENTS:  The patient is 65 years of age and gives a six-day history of intermittent left substernal and precordial chest burning.  The episodes have occurred spontaneously.  They are associated with some numbness and burning in the left arm.  No shortness of breath.  Several episodes occurred today and the association with diaphoresis prompted a visit to the [facility] where EKG was done and she was felt to be having possible angina and was sent to the emergency room for admission.  In the [facility] she had anterior T-wave inversion.  In the emergency room with an episode of chest burning there was mild anterior ST segment elevation.  Chest discomfort resolved promptly upon sublingual nitroglycerin.  She has subsequently been started on a nitroglycerin drip, heparin and Integrilin.  She is currently pain-free.  ALLERGIES:  CODEINE causes nausea.  HABITS:  Negative EtOH.  Positive history of smoking, discontinued two years ago, smoked less than a pack per day for 35 years.  Positive caffeine.  FAMILY HISTORY:  Father died of heart disease at age 67, mother is alive  and well.  Two half sisters are alive and well.  MEDICATIONS: 1. Micardis/HCTZ 80/12.5 mg per day. 2. Ortho-Est. 3. Phentermine for diet/weight loss.  REVIEW OF SYSTEMS:  No dyspnea, neurological symptoms, burning with urination or history of parenchymal kidney disease and no GI disease or history of bleeding.  No recent surgery.  PHYSICAL EXAMINATION:  GENERAL:  On examination the patient is in no acute distress.  The skin is warm and dry, no nail bed cyanosis is noted.  VITAL SIGNS:  Blood pressure is 140/70, heart rate 88.  SKIN:  Clear.  HEENT EXAM:  Reveals pupils that are equal and reactive to light.  No xanthelasma is noted.  NECK EXAM:  Reveals no JVD, carotid bruits or thyromegaly.  CHEST:  Clear to auscultation and percussion.  CARDIAC EXAM:  Reveals no clicks, rubs, murmurs or gallops.  EXTREMITIES:  Reveal no edema.  Femoral pulses are 2+, posterior tibial pulses are 2+ bilaterally.  NEUROLOGICAL EXAM:  Reveals the patient is alert and oriented x 3, no focal cranial nerve deficits.  LABORATORY DATA:  EKG #1 reveals anterior T-wave inversions, #2 mild ST segment elevation in V1 through V3 done before the patient was totally pain-free.  Chest x-ray I have not yet seen. DD:  05/18/01 TD:  05/18/01 Job: [id number]

## 2011-05-10 NOTE — Discharge Summary (Signed)
[place]. [facility]  Patient:    Hailey Levine, Hailey Levine                   MRN: [id number] Adm. Date:  [id number] Attending:  Dyanne Carrel CC:         Marinda Elk, M.D., [facility] Medicine  CVTS   Discharge Summary  INDICATIONS FOR PROCEDURE:  Acute coronary syndrome with trace-positive cardiac enzymes and recurrent substernal chest discomfort.  PROCEDURES PERFORMED: 1. Left heart catheterization. 2. Selective coronary angiography. 3. Left ventriculography.  DESCRIPTION OF PROCEDURE:  After informed consent, a 6 French sheath was inserted into the right femoral artery using a modified Seldinger technique. A 6 French A2 multipurpose catheter was used for hemodynamic recordings, left ventriculography by hand injection, and selective  coronary angiography.  A total of 200 mcg of intracoronary nitroglycerin was administered into the left coronary. A #4 6 French right Judkins catheter was used for right coronary angiography.  The patient tolerated the procedure without significant complications.  RESULTS:  I:  HEMODYNAMIC DATA:     a. The aortic pressure was 147/84 mmHg.     b. Left ventricular pressure 145/31 mmHg.  II:  LEFT VENTRICULOGRAPHY:  The left ventricle is surprisingly severely impaired.  There is mid anterior wall, apical and inferior and apical akinesis.  The ejection fraction is estimated to be 35%.  No mitral regurgitation is appreciated.  III:  SELECTIVE CORONARY ANGIOGRAPHY:     a. Left main:  The left main coronary artery is free of any        significant obstruction.  It gives origin to a large circumflex        and a large left coronary distribution.     b. Left anterior descending coronary artery:  The left anterior        descending coronary artery is essentially totally occluded.  There        is a 99% stenosis with TIMI grade 2 flow in the LAD.  This occlusion        occurs beyond the large first diagonal and  second septal perforator.        The first diagonal is very large and bifurcates on the left lateral        wall.  It is severely diseased containing at least 80% stenosis        before the bifurcation but also diffuse disease more proximally.  The        LAD does not wrap around the left ventricular apex.     c. Circumflex artery:  The circumflex is a moderate to large artery        giving origin to two obtuse marginal branches.  Prior to the origin        of the first obtuse marginal in the mid vessel there is an eccentric        90% stenosis.  The entire circumflex system appears relatively        small and diffusely diseased.     d. Right coronary artery:  The right coronary artery is large in caliber.        There is a mild shepherds crook origin.  There is a hazy 90% mid        vessel stenosis after the first acute marginal branch.  The PDA is        large and wraps around the left ventricular apex.  Two left ventricular  branches arise distally.  The PDA contains areas of disease in the mid        vessel.  The continuation of the right contains areas of disease.        These two spots in the distal right coronary obstruct the vessel by        less than 30%.  CONCLUSIONS: 1. Severe left ventricular dysfunction.  Based upon the patients clinical    presentation, I believe that the inferior and apical wall motion    abnormality may represent stunned myocardium.  The anterior wall may be    frankly infarcted. 2. Severe coronary artery disease with 99% mid left anterior descending, 90%    first diagonal, 90% mid circumflex before the obtuse marginal branch    origins, and 90% mid right coronary artery.  The patients vessels are    small and diffusely diseased suggesting that she has had asymptomatic    diabetes for quite sometime.  RECOMMENDATION:  Consideration of coronary artery bypass grafting. Intensification of medical therapy including continuation of  Integrilin, resumption of IV nitroglycerin, resumption of IV heparin, IV hydration, watching for evidence of CHF. DD:  05/18/01 TD:  05/18/01 Job: [id number]

## 2011-05-10 NOTE — Cardiovascular Report (Signed)
NAMEJODINE, Hailey Levine                      ACCOUNT NO.:  [id number]   MEDICAL RECORD NO.:  [id number]                   PATIENT TYPE:  OIB   LOCATION:  6501                                 FACILITY:  [facility]   PHYSICIAN:  Lesleigh Noe, M.D.            DATE OF BIRTH:  [birth year]   DATE OF PROCEDURE:  07/12/2004  DATE OF DISCHARGE:                              CARDIAC CATHETERIZATION   INDICATION FOR PROCEDURE:  Recent Cardiolite study suggesting a focal area  of ischemia in the inferolateral wall of the apex.  Procedure is being done  to rule out bypass graft occlusion as the patient is status post saphenous  vein graft to the diagonal, saphenous vein graft to the PDA and saphenous  vein graft to an obtuse marginal in 2002.   PROCEDURE PERFORMED:  1. Left heart catheterization.  2. Selective coronary angiography.  3. Left ventriculography.  4. Saphenous vein graft angiography.   DESCRIPTION:  After informed consent, a 4-French sheath was placed in the  right femoral artery using modified Seldinger technique.  A 4-French A2  multipurpose catheter was used for hemodynamic recordings, left  ventriculography by hand injection and selective left and right coronary  angiography as well as saphenous vein graft angiography.  The patient  tolerated the procedure without complications.   RESULTS:   I. HEMODYNAMIC DATA:  A.  Aortic pressure 129/54.  B.  Left ventricular pressure 128/13.   II. LEFT VENTRICULOGRAPHY:  The left ventricle is normal size and  demonstrates normal overall contractility.   III. CORONARY ANGIOGRAPHY:  A.  Left main coronary:  Normal.  B.  Left anterior descending coronary:  Totally occluded after the first  septal perforator.  The large first diagonal is also totally occluded.  There is faint collateral flow to the LAD.  C.  Circumflex artery:  The circumflex coronary artery is a large vessel.  It contains a 50-80% proximal vessel stenosis.  The first  obtuse marginal is  totally occluded.  The second obtuse marginal is distal and supplies  inferoapical region.  D.  Right coronary:  Totally occluded in the mid vessel.   BYPASS ANGIOGRAPHY:  1. Saphenous vein graft to the diagonal:  Widely patent.  2. Saphenous vein graft to the obtuse marginal:  Widely patent.  3. Saphenous vein graft to the PDA:  Widely patent.   CONCLUSION:  1. Totally occluded LAD after the septal perforator.  There is also     occlusion of the first diagonal.  There is total occlusion of the first     obtuse marginal.  There is probably significant mid circumflex stenosis     that compromises flow into a large second obtuse marginal.  The right     coronary is totally occluded in the mid vessel.  2. Patent saphenous vein grafts as outlined above.  3. Normal left ventricular function.   COMMENTS:  Based on lateral and inferoapical ischemia, the  lesion in the mid  circumflex is potentially responsible for this and consideration to stenting  of the circumflex should  be given.                                               Lesleigh Noe, M.D.    HWS/MEDQ  D:  07/12/2004  T:  07/12/2004  Job:  [id number]   cc:   Mikey Bussing, M.D.  [address]  [place]  [place] [address]   Meredith Staggers, M.D.  [address]  [place]  [place] [address]  Fax: [phone]

## 2011-09-19 LAB — POCT I-STAT, CHEM 8
Creatinine, Ser: <0.2 — ABNORMAL LOW
Hemoglobin: 12.6
Sodium: 128 — ABNORMAL LOW
TCO2: 14

## 2011-09-19 LAB — CBC
Hemoglobin: 12.5
MCHC: 33.7
RDW: 14.9

## 2011-09-19 LAB — POCT CARDIAC MARKERS
CKMB, poc: 1.4
CKMB, poc: 2
Myoglobin, poc: 145
Myoglobin, poc: 163
Troponin i, poc: <0.05

## 2011-09-19 LAB — DIFFERENTIAL
Basophils Relative: 0
Eosinophils Absolute: 0
Lymphs Abs: 2
Monocytes Relative: 4
Neutro Abs: 9.9 — ABNORMAL HIGH
Neutrophils Relative %: 80 — ABNORMAL HIGH

## 2011-09-19 LAB — BASIC METABOLIC PANEL
BUN: 21
CO2: 27
GFR calc non Af Amer: 39 — ABNORMAL LOW
Glucose, Bld: 153 — ABNORMAL HIGH
Potassium: 4.8

## 2013-04-06 ENCOUNTER — Telehealth: Payer: Self-pay | Admitting: Nurse Practitioner

## 2013-04-06 NOTE — Telephone Encounter (Signed)
WE also sent in a pre-authorization form for this med in December.  She could try Estradiol 0.5 mg and take 1/2 tab daily. OK to send RX to pharmacy at 1 tab daily take as directed #90/3 RF.

## 2013-04-06 NOTE — Telephone Encounter (Signed)
Premarin/pts ins co not covering any longer/please advise/pt hasn't taken for 3 weeks/rx [facility]: [phone]/[place]

## 2013-04-06 NOTE — Telephone Encounter (Signed)
Routed to Ms.Patty- Jazz (Chart in your credenza to please advise)

## 2013-04-07 ENCOUNTER — Other Ambulatory Visit: Payer: Self-pay | Admitting: *Deleted

## 2013-04-07 MED ORDER — ESTRADIOL 0.5 MG PO TABS: 0.5000 mg | ORAL_TABLET | Freq: Every day | ORAL | Status: DC

## 2013-04-07 NOTE — Telephone Encounter (Signed)
Notified pt. of trying estradiol at 1/2 tablet daily; pt is agreeable. Estradiol #90/3rf's sent through [facility]; pt is aware. 

## 2013-04-07 NOTE — Telephone Encounter (Signed)
Notified pt. of trying estradiol at 1/2 tablet daily; pt is agreeable. Estradiol #90/3rf's sent [facility]; pt is aware.

## 2013-04-16 ENCOUNTER — Telehealth: Payer: Self-pay | Admitting: Nurse Practitioner

## 2013-04-16 NOTE — Telephone Encounter (Signed)
Pt says [facility] needs an authorization from [place] before they will send pt's premarin creme. Authorization number is [id number]

## 2013-04-19 ENCOUNTER — Telehealth: Payer: Self-pay | Admitting: *Deleted

## 2013-04-19 NOTE — Telephone Encounter (Signed)
Chart on your desk for form for [facility] prior authorization for estradiol tab 0.5mg / sue

## 2013-04-20 NOTE — Telephone Encounter (Signed)
Patient notified of forms sent to [facility] for prior authorization. Also talked to representative from [facility] with more information concerning request.

## 2013-05-07 ENCOUNTER — Telehealth: Payer: Self-pay | Admitting: *Deleted

## 2013-05-07 NOTE — Telephone Encounter (Signed)
Medication approved estradiol approved from [place]. [facility]

## 2013-05-20 ENCOUNTER — Telehealth: Payer: Self-pay | Admitting: *Deleted

## 2013-05-20 NOTE — Telephone Encounter (Signed)
Pt. Would like to be called concerning her Rx. For premarin . Insurance sent her a letter stating that the cost was changing from 125.0 for a 90 day supply. Now it has changed to 275.0 for a 90 day supply. [place] 9[place]00 or 9:30. Will return late Monday afternoon/rsb

## 2013-05-21 NOTE — Telephone Encounter (Signed)
LEFT MESSAGE ON CB# TO CALL OFFICE REGARDING MEDICATION PREMARIN/ ESTRADIOL. SEE NOTES IN CHART REVIEW OF APPROVAL FOR ESTRADIOL.

## 2013-05-27 NOTE — Telephone Encounter (Signed)
Left message of need to return call concerning medication .

## 2013-06-02 NOTE — Telephone Encounter (Deleted)
Left message on home CB# of to return call for answer of question swimming.

## 2013-06-07 NOTE — Telephone Encounter (Signed)
Spoke with pt. She will call [facility] to get the cost in order to decide if she wants to continue on  This medication.

## 2013-11-09 ENCOUNTER — Ambulatory Visit: Payer: Self-pay | Admitting: Nurse Practitioner

## 2013-12-01 ENCOUNTER — Ambulatory Visit: Payer: Self-pay | Admitting: Nurse Practitioner

## 2014-01-07 ENCOUNTER — Ambulatory Visit: Payer: Self-pay | Admitting: Nurse Practitioner

## 2014-01-18 ENCOUNTER — Ambulatory Visit: Payer: Self-pay | Admitting: Nurse Practitioner

## 2014-02-28 ENCOUNTER — Encounter: Payer: Self-pay | Admitting: Interventional Cardiology

## 2014-02-28 ENCOUNTER — Ambulatory Visit (INDEPENDENT_AMBULATORY_CARE_PROVIDER_SITE_OTHER): Payer: Medicare Other | Admitting: Interventional Cardiology

## 2014-02-28 VITALS — BP 152/74 | HR 63 | Ht <= 58 in | Wt 151.8 lb

## 2014-02-28 DIAGNOSIS — I25709 Atherosclerosis of coronary artery bypass graft(s), unspecified, with unspecified angina pectoris: Secondary | ICD-10-CM | POA: Insufficient documentation

## 2014-02-28 DIAGNOSIS — E785 Hyperlipidemia, unspecified: Secondary | ICD-10-CM

## 2014-02-28 DIAGNOSIS — I2510 Atherosclerotic heart disease of native coronary artery without angina pectoris: Secondary | ICD-10-CM | POA: Insufficient documentation

## 2014-02-28 DIAGNOSIS — I10 Essential (primary) hypertension: Secondary | ICD-10-CM | POA: Insufficient documentation

## 2014-02-28 NOTE — Patient Instructions (Signed)
Your physician recommends that you continue on your current medications as directed. Please refer to the Current Medication list given to you today.  Your physician wants you to follow-up in: 1 year. You will receive a reminder letter in the mail two months in advance. If you don't receive a letter, please call our office to schedule the follow-up appointment.  

## 2014-02-28 NOTE — Progress Notes (Signed)
Patient ID: Hailey Levine, female   DOB: 12/15/1946, 68 y.o.   MRN: [id number] Past Medical History  Coronary atherosclerotic heart disease with CABG 2002 with SVG to diagonal, SVG to RCA, SVG to OM. LAD not bypassed because of the small and atretic   Carotid disease    Hypertension   Hyperlipidemia   Diabetes      [address] [place]  [id number] Phone: [phone]) [phone] Fax:  [phone]) [phone]  Date:  02/28/2014   ID:  Hailey JonesMarilyn P Levine, DOB 01/27/1946, MRN [id number]  PCP:  Default, Provider, MD   ASSESSMENT:  1. Coronary artery disease, stable without angina 2. Hypertension, controlled 3. Hyperlipidemia  PLAN:  1. One-year followup 2. No change in therapy   SUBJECTIVE: Hailey Levine is a 68 y.o. female who is tearful because she lost her husband nearly one year ago. She has been on several transcontinental trips. She denies any cardiac complaints. She has not used nitroglycerin. There is no edema or claudication. Her husband died of pancreatic cancer   Wt Readings from Last 3 Encounters:  02/28/14 151 lb 12.8 oz (68.856 kg)     Past Medical History  Diagnosis Date  . Hypertension     Current Outpatient Prescriptions  Medication Sig Dispense Refill  . ALPRAZolam (XANAX) 0.25 MG tablet Take 0.25 mg by mouth as needed for anxiety.      Hailey Levine. amLODipine (NORVASC) 5 MG tablet Take 5 mg by mouth daily.      Hailey Levine. aspirin 81 MG tablet Take 81 mg by mouth daily.      Hailey Levine. atorvastatin (LIPITOR) 40 MG tablet Take 40 mg by mouth daily.      . carvedilol (COREG) 25 MG tablet Take 25 mg by mouth 2 (two) times daily with a meal.      . cycloSPORINE (RESTASIS) 0.05 % ophthalmic emulsion Place 1 drop into both eyes 2 (two) times daily.      . hydrochlorothiazide (HYDRODIURIL) 25 MG tablet Take 25 mg by mouth daily.      Hailey Levine. losartan (COZAAR) 100 MG tablet Take 100 mg by mouth daily.      . metFORMIN (GLUCOPHAGE-XR) 500 MG 24 hr tablet Take 500 mg by mouth 2 (two)  times daily.      . Multiple Vitamins-Minerals (CENTRUM SILVER PO) Take by mouth daily.      . Omega-3 Fatty Acids (OMEGA 3 PO) Take 2 tablets by mouth daily.      . probenecid (BENEMID) 500 MG tablet Take 500 mg by mouth 2 (two) times daily.       No current facility-administered medications for this visit.    Allergies:    Allergies  Allergen Reactions  . Metoprolol     "Throws up white foamy liquid"    Social History:  The patient  reports that she has quit smoking. She does not have any smokeless tobacco history on file. She reports that she does not drink alcohol or use illicit drugs.   ROS:  Please see the history of present illness.   Stable weight   All other systems reviewed and negative.   OBJECTIVE: VS:  BP 152/74  Pulse 63  Ht 4\' 10"  (1.473 m)  Wt 151 lb 12.8 oz (68.856 kg)  BMI 31.73 kg/m2 Well nourished, well developed, in no acute distress, mildly obese HEENT: normal Neck: JVD flat. Carotid bruit absent  Cardiac:  normal S1, S2; RRR; no murmur Lungs:  clear to auscultation  bilaterally, no wheezing, rhonchi or rales Abd: soft, nontender, no hepatomegaly Ext: Edema absent. Pulses 2+ and symmetric Skin: warm and dry Neuro:  CNs 2-12 intact, no focal abnormalities noted  EKG:  Sinus bradycardia with left axis deviation. Low voltage.       Signed, Hailey Needle III, MD 02/28/2014 11:49 AM

## 2014-03-26 ENCOUNTER — Encounter: Payer: Self-pay | Admitting: *Deleted

## 2014-03-30 ENCOUNTER — Encounter (HOSPITAL_BASED_OUTPATIENT_CLINIC_OR_DEPARTMENT_OTHER): Payer: Self-pay | Admitting: Emergency Medicine

## 2014-03-30 ENCOUNTER — Emergency Department (HOSPITAL_BASED_OUTPATIENT_CLINIC_OR_DEPARTMENT_OTHER): Payer: Medicare Other

## 2014-03-30 ENCOUNTER — Emergency Department (HOSPITAL_BASED_OUTPATIENT_CLINIC_OR_DEPARTMENT_OTHER)
Admission: EM | Admit: 2014-03-30 | Discharge: 2014-03-30 | Disposition: A | Discharge status: Home / Self Care | Payer: Medicare Other | Attending: Emergency Medicine | Admitting: Emergency Medicine

## 2014-03-30 DIAGNOSIS — I6350 Cerebral infarction due to unspecified occlusion or stenosis of unspecified cerebral artery: Secondary | ICD-10-CM | POA: Insufficient documentation

## 2014-03-30 DIAGNOSIS — Y93H2 Activity, gardening and landscaping: Secondary | ICD-10-CM | POA: Insufficient documentation

## 2014-03-30 DIAGNOSIS — S42293A Other displaced fracture of upper end of unspecified humerus, initial encounter for closed fracture: Secondary | ICD-10-CM | POA: Insufficient documentation

## 2014-03-30 DIAGNOSIS — Z9861 Coronary angioplasty status: Secondary | ICD-10-CM | POA: Insufficient documentation

## 2014-03-30 DIAGNOSIS — Z87891 Personal history of nicotine dependence: Secondary | ICD-10-CM | POA: Insufficient documentation

## 2014-03-30 DIAGNOSIS — S42292A Other displaced fracture of upper end of left humerus, initial encounter for closed fracture: Secondary | ICD-10-CM

## 2014-03-30 DIAGNOSIS — Z23 Encounter for immunization: Secondary | ICD-10-CM | POA: Insufficient documentation

## 2014-03-30 DIAGNOSIS — Z7982 Long term (current) use of aspirin: Secondary | ICD-10-CM | POA: Insufficient documentation

## 2014-03-30 DIAGNOSIS — Y9289 Other specified places as the place of occurrence of the external cause: Secondary | ICD-10-CM | POA: Insufficient documentation

## 2014-03-30 DIAGNOSIS — I10 Essential (primary) hypertension: Secondary | ICD-10-CM | POA: Insufficient documentation

## 2014-03-30 DIAGNOSIS — Z79899 Other long term (current) drug therapy: Secondary | ICD-10-CM | POA: Insufficient documentation

## 2014-03-30 MED ORDER — OXYCODONE-ACETAMINOPHEN 5-325 MG PO TABS
2.0000 | ORAL_TABLET | Freq: Once | ORAL | Status: AC
  Administered 2014-03-30: 2 via ORAL
  Filled 2014-03-30: qty 2

## 2014-03-30 MED ORDER — TETANUS-DIPHTH-ACELL PERTUSSIS 5-2.5-18.5 LF-MCG/0.5 IM SUSP
0.5000 mL | Freq: Once | INTRAMUSCULAR | Status: AC
  Administered 2014-03-30: 0.5 mL via INTRAMUSCULAR
  Filled 2014-03-30: qty 0.5

## 2014-03-30 MED ORDER — ONDANSETRON HCL 4 MG/2ML IJ SOLN
4.0000 mg | Freq: Once | INTRAMUSCULAR | Status: AC
  Administered 2014-03-30: 4 mg via INTRAVENOUS
  Filled 2014-03-30: qty 2

## 2014-03-30 MED ORDER — MORPHINE SULFATE 4 MG/ML IJ SOLN
4.0000 mg | Freq: Once | INTRAMUSCULAR | Status: AC
  Administered 2014-03-30: 4 mg via INTRAVENOUS
  Filled 2014-03-30: qty 1

## 2014-03-30 MED ORDER — OXYCODONE-ACETAMINOPHEN 5-325 MG PO TABS: 2.0000 | ORAL_TABLET | ORAL | Status: DC | PRN

## 2014-03-30 NOTE — Discharge Instructions (Signed)
Humerus Fracture, Treated with Immobilization Follow up with Dr. Despina HickAlusio this week. Return to the ED if you develop new or worsening symptoms. The humerus is the large bone in your upper arm. You have a broken (fractured) humerus. These fractures are easily diagnosed with X-rays. TREATMENT  Simple fractures which will heal without disability are treated with simple immobilization. Immobilization means you will wear a cast, splint, or sling. You have a fracture which will do well with immobilization. The fracture will heal well simply by being held in a good position until it is stable enough to begin range of motion exercises. Do not take part in activities which would further injure your arm.  HOME CARE INSTRUCTIONS   Put ice on the injured area.  Put ice in a plastic bag.  Place a towel between your skin and the bag.  Leave the ice on for 15-20 minutes, 03-04 times a day.  If you have a cast:  Do not scratch the skin under the cast using sharp or pointed objects.  Check the skin around the cast every day. You may put lotion on any red or sore areas.  Keep your cast dry and clean.  If you have a splint:  Wear the splint as directed.  Keep your splint dry and clean.  You may loosen the elastic around the splint if your fingers become numb, tingle, or turn cold or blue.  If you have a sling:  Wear the sling as directed.  Do not put pressure on any part of your cast or splint until it is fully hardened.  Your cast or splint can be protected during bathing with a plastic bag. Do not lower the cast or splint into water.  Only take over-the-counter or prescription medicines for pain, discomfort, or fever as directed by your caregiver.  Do range of motion exercises as instructed by your caregiver.  Follow up as directed by your caregiver. This is very important in order to avoid permanent injury or disability and chronic pain. SEEK IMMEDIATE MEDICAL CARE IF:   Your skin or  nails in the injured arm turn blue or gray.  Your arm feels cold or numb.  You develop severe pain in the injured arm.  You are having problems with the medicines you were given. MAKE SURE YOU:   Understand these instructions.  Will watch your condition.  Will get help right away if you are not doing well or get worse. Document Released: 03/17/2001 Document Revised: 03/02/2012 Document Reviewed: 01/23/2011 [facility] Patient Information 2014 [employer] [place].

## 2014-03-30 NOTE — ED Notes (Signed)
Pt. Ambulated around dept, tolerated well, denies dizziness.
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
## 2014-03-30 NOTE — ED Notes (Signed)
Pt remains in radiology 

## 2014-03-30 NOTE — ED Notes (Signed)
MD at bedside. 

## 2014-04-01 ENCOUNTER — Encounter (HOSPITAL_COMMUNITY): Payer: Self-pay | Admitting: Pharmacy Technician

## 2014-04-04 ENCOUNTER — Encounter (HOSPITAL_COMMUNITY): Payer: Self-pay | Admitting: *Deleted

## 2014-04-04 MED ORDER — CEFAZOLIN SODIUM-DEXTROSE 2-3 GM-% IV SOLR
2.0000 g | INTRAVENOUS | Status: AC
  Administered 2014-04-05: 2 g via INTRAVENOUS
  Filled 2014-04-04: qty 50

## 2014-04-04 MED ORDER — CHLORHEXIDINE GLUCONATE 4 % EX LIQD
60.0000 mL | Freq: Once | CUTANEOUS | Status: DC
  Filled 2014-04-04: qty 60

## 2014-04-05 ENCOUNTER — Inpatient Hospital Stay (HOSPITAL_COMMUNITY)
Admission: RE | Admit: 2014-04-05 | Discharge: 2014-04-06 | DRG: 483 | Disposition: A | Discharge status: Home / Self Care | Payer: Medicare Other | Source: Ambulatory Visit | Attending: Orthopedic Surgery | Admitting: Orthopedic Surgery

## 2014-04-05 ENCOUNTER — Encounter (HOSPITAL_COMMUNITY)
Admission: RE | Disposition: A | Discharge status: Home / Self Care | Payer: Self-pay | Source: Ambulatory Visit | Attending: Orthopedic Surgery

## 2014-04-05 ENCOUNTER — Inpatient Hospital Stay (HOSPITAL_COMMUNITY): Payer: Medicare Other | Admitting: Anesthesiology

## 2014-04-05 ENCOUNTER — Encounter (HOSPITAL_COMMUNITY): Payer: Medicare Other | Admitting: Anesthesiology

## 2014-04-05 ENCOUNTER — Encounter (HOSPITAL_COMMUNITY): Payer: Self-pay | Admitting: Anesthesiology

## 2014-04-05 DIAGNOSIS — I252 Old myocardial infarction: Secondary | ICD-10-CM

## 2014-04-05 DIAGNOSIS — Z885 Allergy status to narcotic agent status: Secondary | ICD-10-CM

## 2014-04-05 DIAGNOSIS — S42293A Other displaced fracture of upper end of unspecified humerus, initial encounter for closed fracture: Secondary | ICD-10-CM | POA: Diagnosis present

## 2014-04-05 DIAGNOSIS — S42253A Displaced fracture of greater tuberosity of unspecified humerus, initial encounter for closed fracture: Principal | ICD-10-CM | POA: Diagnosis present

## 2014-04-05 DIAGNOSIS — F329 Major depressive disorder, single episode, unspecified: Secondary | ICD-10-CM | POA: Diagnosis present

## 2014-04-05 DIAGNOSIS — Z951 Presence of aortocoronary bypass graft: Secondary | ICD-10-CM

## 2014-04-05 DIAGNOSIS — F3289 Other specified depressive episodes: Secondary | ICD-10-CM | POA: Diagnosis present

## 2014-04-05 DIAGNOSIS — Z8249 Family history of ischemic heart disease and other diseases of the circulatory system: Secondary | ICD-10-CM

## 2014-04-05 DIAGNOSIS — I10 Essential (primary) hypertension: Secondary | ICD-10-CM | POA: Diagnosis present

## 2014-04-05 DIAGNOSIS — Z7982 Long term (current) use of aspirin: Secondary | ICD-10-CM

## 2014-04-05 DIAGNOSIS — M129 Arthropathy, unspecified: Secondary | ICD-10-CM | POA: Diagnosis present

## 2014-04-05 DIAGNOSIS — E119 Type 2 diabetes mellitus without complications: Secondary | ICD-10-CM | POA: Diagnosis present

## 2014-04-05 DIAGNOSIS — Z87891 Personal history of nicotine dependence: Secondary | ICD-10-CM

## 2014-04-05 DIAGNOSIS — Z96619 Presence of unspecified artificial shoulder joint: Secondary | ICD-10-CM

## 2014-04-05 HISTORY — DX: Unspecified osteoarthritis, unspecified site: M19.90

## 2014-04-05 HISTORY — DX: Atherosclerotic heart disease of native coronary artery without angina pectoris: I25.10

## 2014-04-05 HISTORY — DX: Acute myocardial infarction, unspecified: I21.9

## 2014-04-05 HISTORY — PX: REVERSE SHOULDER ARTHROPLASTY: SHX5054

## 2014-04-05 HISTORY — DX: Depression, unspecified: F32.A

## 2014-04-05 HISTORY — DX: Major depressive disorder, single episode, unspecified: F32.9

## 2014-04-05 HISTORY — DX: Type 2 diabetes mellitus without complications: E11.9

## 2014-04-05 LAB — COMPREHENSIVE METABOLIC PANEL
ALK PHOS: 78 U/L (ref 39–117)
ALT: 12 U/L (ref 0–35)
AST: 22 U/L (ref 0–37)
Albumin: 3.3 g/dL — ABNORMAL LOW (ref 3.5–5.2)
BUN: 40 mg/dL — AB (ref 6–23)
CHLORIDE: 95 meq/L — AB (ref 96–112)
CO2: 23 mEq/L (ref 19–32)
CREATININE: 1.4 mg/dL — AB (ref 0.50–1.10)
Calcium: 9.7 mg/dL (ref 8.4–10.5)
GFR, EST AFRICAN AMERICAN: 43 mL/min — AB (ref >90)
GFR, EST NON AFRICAN AMERICAN: 37 mL/min — AB (ref >90)
GLUCOSE: 186 mg/dL — AB (ref 70–99)
POTASSIUM: 4.5 meq/L (ref 3.7–5.3)
Sodium: 136 mEq/L — ABNORMAL LOW (ref 137–147)
Total Bilirubin: 0.5 mg/dL (ref 0.3–1.2)
Total Protein: 7.5 g/dL (ref 6.0–8.3)

## 2014-04-05 LAB — CBC WITH DIFFERENTIAL/PLATELET
Basophils Absolute: 0.1 10*3/uL (ref 0.0–0.1)
Basophils Relative: 1 % (ref 0–1)
Eosinophils Absolute: 0.4 10*3/uL (ref 0.0–0.7)
Eosinophils Relative: 3 % (ref 0–5)
HEMATOCRIT: 29.1 % — AB (ref 36.0–46.0)
HEMOGLOBIN: 10 g/dL — AB (ref 12.0–15.0)
LYMPHS ABS: 2 10*3/uL (ref 0.7–4.0)
Lymphocytes Relative: 13 % (ref 12–46)
MCH: 30.9 pg (ref 26.0–34.0)
MCHC: 34.4 g/dL (ref 30.0–36.0)
MCV: 89.8 fL (ref 78.0–100.0)
MONO ABS: 1.3 10*3/uL — AB (ref 0.1–1.0)
MONOS PCT: 8 % (ref 3–12)
NEUTROS PCT: 75 % (ref 43–77)
Neutro Abs: 11.6 10*3/uL — ABNORMAL HIGH (ref 1.7–7.7)
Platelets: 269 10*3/uL (ref 150–400)
RBC: 3.24 MIL/uL — AB (ref 3.87–5.11)
RDW: 15 % (ref 11.5–15.5)
WBC: 15.4 10*3/uL — ABNORMAL HIGH (ref 4.0–10.5)

## 2014-04-05 LAB — ABO/RH: ABO/RH(D): O NEG

## 2014-04-05 LAB — GLUCOSE, CAPILLARY
Glucose-Capillary: 176 mg/dL — ABNORMAL HIGH (ref 70–99)
Glucose-Capillary: 136 mg/dL — ABNORMAL HIGH (ref 70–99)

## 2014-04-05 LAB — PROTIME-INR
INR: 1.09 (ref 0.00–1.49)
PROTHROMBIN TIME: 13.9 s (ref 11.6–15.2)

## 2014-04-05 LAB — TYPE AND SCREEN
ABO/RH(D): O NEG
Antibody Screen: NEGATIVE

## 2014-04-05 LAB — APTT: aPTT: 34 seconds (ref 24–37)

## 2014-04-05 SURGERY — ARTHROPLASTY, SHOULDER, TOTAL, REVERSE
Anesthesia: General | Site: Shoulder | Laterality: Left

## 2014-04-05 MED ORDER — CYCLOSPORINE 0.05 % OP EMUL
1.0000 [drp] | Freq: Two times a day (BID) | OPHTHALMIC | Status: DC
  Administered 2014-04-05 – 2014-04-06 (×2): 1 [drp] via OPHTHALMIC
  Filled 2014-04-05 (×3): qty 1

## 2014-04-05 MED ORDER — HYDROMORPHONE HCL PF 1 MG/ML IJ SOLN: 0.2500 mg | INTRAMUSCULAR | Status: DC | PRN

## 2014-04-05 MED ORDER — ASPIRIN 81 MG PO CHEW
81.0000 mg | CHEWABLE_TABLET | Freq: Every day | ORAL | Status: DC
  Administered 2014-04-05 – 2014-04-06 (×2): 81 mg via ORAL
  Filled 2014-04-05 (×2): qty 1

## 2014-04-05 MED ORDER — KETOROLAC TROMETHAMINE 15 MG/ML IJ SOLN
15.0000 mg | Freq: Four times a day (QID) | INTRAMUSCULAR | Status: DC
  Administered 2014-04-05 – 2014-04-06 (×3): 15 mg via INTRAVENOUS
  Filled 2014-04-05 (×7): qty 1

## 2014-04-05 MED ORDER — ONDANSETRON HCL 4 MG/2ML IJ SOLN: 4.0000 mg | Freq: Once | INTRAMUSCULAR | Status: DC | PRN

## 2014-04-05 MED ORDER — HYDROCODONE-ACETAMINOPHEN 5-325 MG PO TABS
1.0000 | ORAL_TABLET | ORAL | Status: DC | PRN
  Administered 2014-04-06: 1 via ORAL
  Filled 2014-04-05: qty 1

## 2014-04-05 MED ORDER — DOCUSATE SODIUM 100 MG PO CAPS
100.0000 mg | ORAL_CAPSULE | Freq: Two times a day (BID) | ORAL | Status: DC
  Administered 2014-04-05 – 2014-04-06 (×2): 100 mg via ORAL
  Filled 2014-04-05 (×3): qty 1

## 2014-04-05 MED ORDER — HYDROCHLOROTHIAZIDE 25 MG PO TABS
25.0000 mg | ORAL_TABLET | Freq: Every day | ORAL | Status: DC
  Administered 2014-04-05: 25 mg via ORAL
  Filled 2014-04-05 (×2): qty 1

## 2014-04-05 MED ORDER — SODIUM CHLORIDE 0.9 % IJ SOLN
INTRAMUSCULAR | Status: AC
  Filled 2014-04-05: qty 20

## 2014-04-05 MED ORDER — FENTANYL CITRATE 0.05 MG/ML IJ SOLN
INTRAMUSCULAR | Status: AC
  Administered 2014-04-05: 50 ug via INTRAVENOUS
  Filled 2014-04-05: qty 2

## 2014-04-05 MED ORDER — PHENYLEPHRINE HCL 10 MG/ML IJ SOLN
INTRAMUSCULAR | Status: DC | PRN
  Administered 2014-04-05 (×5): 80 ug via INTRAVENOUS

## 2014-04-05 MED ORDER — GLYCOPYRROLATE 0.2 MG/ML IJ SOLN
INTRAMUSCULAR | Status: AC
  Filled 2014-04-05: qty 2

## 2014-04-05 MED ORDER — MIDAZOLAM HCL 2 MG/2ML IJ SOLN
1.0000 mg | INTRAMUSCULAR | Status: DC | PRN
  Administered 2014-04-05: 2 mg via INTRAVENOUS

## 2014-04-05 MED ORDER — OXYCODONE HCL 5 MG PO TABS: 5.0000 mg | ORAL_TABLET | Freq: Once | ORAL | Status: DC | PRN

## 2014-04-05 MED ORDER — ARTIFICIAL TEARS OP OINT
TOPICAL_OINTMENT | OPHTHALMIC | Status: DC | PRN
  Administered 2014-04-05: 1 via OPHTHALMIC

## 2014-04-05 MED ORDER — FENTANYL CITRATE 0.05 MG/ML IJ SOLN
INTRAMUSCULAR | Status: DC | PRN
  Administered 2014-04-05: 100 ug via INTRAVENOUS

## 2014-04-05 MED ORDER — CEFAZOLIN SODIUM 1-5 GM-% IV SOLN
1.0000 g | Freq: Four times a day (QID) | INTRAVENOUS | Status: AC
  Administered 2014-04-05 – 2014-04-06 (×2): 1 g via INTRAVENOUS
  Filled 2014-04-05: qty 50

## 2014-04-05 MED ORDER — LACTATED RINGERS IV SOLN
INTRAVENOUS | Status: DC
  Administered 2014-04-05: 12:00:00 via INTRAVENOUS

## 2014-04-05 MED ORDER — METOCLOPRAMIDE HCL 5 MG/ML IJ SOLN: 5.0000 mg | Freq: Three times a day (TID) | INTRAMUSCULAR | Status: DC | PRN

## 2014-04-05 MED ORDER — PHENYLEPHRINE 40 MCG/ML (10ML) SYRINGE FOR IV PUSH (FOR BLOOD PRESSURE SUPPORT)
PREFILLED_SYRINGE | INTRAVENOUS | Status: AC
  Filled 2014-04-05: qty 10

## 2014-04-05 MED ORDER — FENTANYL CITRATE 0.05 MG/ML IJ SOLN
INTRAMUSCULAR | Status: AC
  Filled 2014-04-05: qty 5

## 2014-04-05 MED ORDER — OXYCODONE HCL 5 MG/5ML PO SOLN: 5.0000 mg | Freq: Once | ORAL | Status: DC | PRN

## 2014-04-05 MED ORDER — INSULIN ASPART 100 UNIT/ML ~~LOC~~ SOLN
0.0000 [IU] | Freq: Three times a day (TID) | SUBCUTANEOUS | Status: DC
  Administered 2014-04-05: 1 [IU] via SUBCUTANEOUS
  Administered 2014-04-06: 07:00:00 via SUBCUTANEOUS

## 2014-04-05 MED ORDER — PROPOFOL 10 MG/ML IV BOLUS
INTRAVENOUS | Status: DC | PRN
  Administered 2014-04-05: 100 mg via INTRAVENOUS

## 2014-04-05 MED ORDER — FENTANYL CITRATE 0.05 MG/ML IJ SOLN
50.0000 ug | INTRAMUSCULAR | Status: DC | PRN
  Administered 2014-04-05: 50 ug via INTRAVENOUS

## 2014-04-05 MED ORDER — DEXAMETHASONE SODIUM PHOSPHATE 4 MG/ML IJ SOLN
INTRAMUSCULAR | Status: AC
  Filled 2014-04-05: qty 2

## 2014-04-05 MED ORDER — AMLODIPINE BESYLATE 10 MG PO TABS
10.0000 mg | ORAL_TABLET | Freq: Every day | ORAL | Status: DC
  Filled 2014-04-05 (×4): qty 1

## 2014-04-05 MED ORDER — METOCLOPRAMIDE HCL 10 MG PO TABS: 5.0000 mg | ORAL_TABLET | Freq: Three times a day (TID) | ORAL | Status: DC | PRN

## 2014-04-05 MED ORDER — DIPHENHYDRAMINE HCL 12.5 MG/5ML PO ELIX: 12.5000 mg | ORAL_SOLUTION | ORAL | Status: DC | PRN

## 2014-04-05 MED ORDER — MEPERIDINE HCL 25 MG/ML IJ SOLN: 6.2500 mg | INTRAMUSCULAR | Status: DC | PRN

## 2014-04-05 MED ORDER — LACTATED RINGERS IV SOLN: INTRAVENOUS | Status: DC

## 2014-04-05 MED ORDER — PROBENECID 500 MG PO TABS: 500.0000 mg | ORAL_TABLET | Freq: Two times a day (BID) | ORAL | Status: DC

## 2014-04-05 MED ORDER — ACETAMINOPHEN 325 MG PO TABS: 650.0000 mg | ORAL_TABLET | Freq: Four times a day (QID) | ORAL | Status: DC | PRN

## 2014-04-05 MED ORDER — ONDANSETRON HCL 4 MG/2ML IJ SOLN
INTRAMUSCULAR | Status: AC
  Filled 2014-04-05: qty 2

## 2014-04-05 MED ORDER — GLYCOPYRROLATE 0.2 MG/ML IJ SOLN
INTRAMUSCULAR | Status: DC | PRN
  Administered 2014-04-05: .6 mg via INTRAVENOUS

## 2014-04-05 MED ORDER — ALPRAZOLAM 0.25 MG PO TABS: 0.2500 mg | ORAL_TABLET | Freq: Three times a day (TID) | ORAL | Status: DC | PRN

## 2014-04-05 MED ORDER — ONDANSETRON HCL 4 MG/2ML IJ SOLN: 4.0000 mg | Freq: Four times a day (QID) | INTRAMUSCULAR | Status: DC | PRN

## 2014-04-05 MED ORDER — LACTATED RINGERS IV SOLN
INTRAVENOUS | Status: DC | PRN
  Administered 2014-04-05 (×2): via INTRAVENOUS

## 2014-04-05 MED ORDER — ACETAMINOPHEN 650 MG RE SUPP: 650.0000 mg | Freq: Four times a day (QID) | RECTAL | Status: DC | PRN

## 2014-04-05 MED ORDER — SUCCINYLCHOLINE CHLORIDE 20 MG/ML IJ SOLN
INTRAMUSCULAR | Status: AC
Start: 2014-04-05 — End: 2014-04-05
  Filled 2014-04-05: qty 1

## 2014-04-05 MED ORDER — BUPIVACAINE-EPINEPHRINE PF 0.5-1:200000 % IJ SOLN
INTRAMUSCULAR | Status: DC | PRN
  Administered 2014-04-05: 30 mL via PERINEURAL

## 2014-04-05 MED ORDER — LOSARTAN POTASSIUM 50 MG PO TABS
100.0000 mg | ORAL_TABLET | Freq: Every day | ORAL | Status: DC
  Administered 2014-04-05: 100 mg via ORAL
  Filled 2014-04-05 (×2): qty 2

## 2014-04-05 MED ORDER — LIDOCAINE HCL (CARDIAC) 20 MG/ML IV SOLN
INTRAVENOUS | Status: DC | PRN
  Administered 2014-04-05: 100 mg via INTRAVENOUS

## 2014-04-05 MED ORDER — CARVEDILOL 25 MG PO TABS
25.0000 mg | ORAL_TABLET | Freq: Two times a day (BID) | ORAL | Status: DC
  Administered 2014-04-05 – 2014-04-06 (×2): 25 mg via ORAL
  Filled 2014-04-05 (×4): qty 1

## 2014-04-05 MED ORDER — PHENOL 1.4 % MT LIQD: 1.0000 | OROMUCOSAL | Status: DC | PRN

## 2014-04-05 MED ORDER — EPHEDRINE SULFATE 50 MG/ML IJ SOLN
INTRAMUSCULAR | Status: AC
  Filled 2014-04-05: qty 1

## 2014-04-05 MED ORDER — MIDAZOLAM HCL 2 MG/2ML IJ SOLN
INTRAMUSCULAR | Status: AC
  Administered 2014-04-05: 2 mg via INTRAVENOUS
  Filled 2014-04-05: qty 2

## 2014-04-05 MED ORDER — ARTIFICIAL TEARS OP OINT
TOPICAL_OINTMENT | OPHTHALMIC | Status: AC
  Filled 2014-04-05: qty 3.5

## 2014-04-05 MED ORDER — ATORVASTATIN CALCIUM 40 MG PO TABS
40.0000 mg | ORAL_TABLET | Freq: Every day | ORAL | Status: DC
  Administered 2014-04-05: 40 mg via ORAL
  Filled 2014-04-05 (×2): qty 1

## 2014-04-05 MED ORDER — NEOSTIGMINE METHYLSULFATE 1 MG/ML IJ SOLN
INTRAMUSCULAR | Status: DC | PRN
Start: 2014-04-05 — End: 2014-04-05
  Administered 2014-04-05: 3 mg via INTRAVENOUS

## 2014-04-05 MED ORDER — HYDROMORPHONE HCL 2 MG PO TABS: 2.0000 mg | ORAL_TABLET | ORAL | Status: DC | PRN

## 2014-04-05 MED ORDER — DIAZEPAM 5 MG PO TABS: 2.5000 mg | ORAL_TABLET | Freq: Four times a day (QID) | ORAL | Status: DC | PRN

## 2014-04-05 MED ORDER — MENTHOL 3 MG MT LOZG: 1.0000 | LOZENGE | OROMUCOSAL | Status: DC | PRN

## 2014-04-05 MED ORDER — SODIUM CHLORIDE 0.9 % IR SOLN
Status: DC | PRN
  Administered 2014-04-05: 1000 mL

## 2014-04-05 MED ORDER — PROPOFOL 10 MG/ML IV BOLUS
INTRAVENOUS | Status: AC
  Filled 2014-04-05: qty 20

## 2014-04-05 MED ORDER — NEOSTIGMINE METHYLSULFATE 1 MG/ML IJ SOLN
INTRAMUSCULAR | Status: AC
  Filled 2014-04-05: qty 10

## 2014-04-05 MED ORDER — ONDANSETRON HCL 4 MG/2ML IJ SOLN
INTRAMUSCULAR | Status: DC | PRN
  Administered 2014-04-05: 4 mg via INTRAVENOUS

## 2014-04-05 MED ORDER — ROCURONIUM BROMIDE 50 MG/5ML IV SOLN
INTRAVENOUS | Status: AC
  Filled 2014-04-05: qty 1

## 2014-04-05 MED ORDER — ONDANSETRON HCL 4 MG PO TABS: 4.0000 mg | ORAL_TABLET | Freq: Four times a day (QID) | ORAL | Status: DC | PRN

## 2014-04-05 MED ORDER — ALUM & MAG HYDROXIDE-SIMETH 200-200-20 MG/5ML PO SUSP: 30.0000 mL | ORAL | Status: DC | PRN

## 2014-04-05 MED ORDER — ROCURONIUM BROMIDE 100 MG/10ML IV SOLN
INTRAVENOUS | Status: DC | PRN
  Administered 2014-04-05: 40 mg via INTRAVENOUS

## 2014-04-05 SURGICAL SUPPLY — 74 items
BIT DRILL 170X2.5X (BIT) IMPLANT
BIT DRL 170X2.5X (BIT) ×1
BLADE SAW SGTL 83.5X18.5 (BLADE) ×2 IMPLANT
BRUSH FEMORAL CANAL (MISCELLANEOUS) IMPLANT
CAPT SHOULD DELTAXTEND CEM MOD ×1 IMPLANT
CEMENT BONE DEPUY (Cement) ×2 IMPLANT
CEMENT HV SMART SET (Cement) ×1 IMPLANT
CLSR STERI-STRIP ANTIMIC 1/2X4 (GAUZE/BANDAGES/DRESSINGS) ×1 IMPLANT
COVER SURGICAL LIGHT HANDLE (MISCELLANEOUS) ×2 IMPLANT
DRAPE INCISE IOBAN 66X45 STRL (DRAPES) ×2 IMPLANT
DRAPE SURG 17X11 SM STRL (DRAPES) ×2 IMPLANT
DRAPE U-SHAPE 47X51 STRL (DRAPES) ×2 IMPLANT
DRILL 2.5 (BIT) ×2
DRILL BIT 7/64X5 (BIT) ×1 IMPLANT
DRSG AQUACEL AG ADV 3.5X10 (GAUZE/BANDAGES/DRESSINGS) ×2 IMPLANT
DRSG MEPILEX BORDER 4X8 (GAUZE/BANDAGES/DRESSINGS) IMPLANT
DURAPREP 26ML APPLICATOR (WOUND CARE) ×4 IMPLANT
ELECT BLADE 4.0 EZ CLEAN MEGAD (MISCELLANEOUS) ×2
ELECT CAUTERY BLADE 6.4 (BLADE) ×2 IMPLANT
ELECT REM PT RETURN 9FT ADLT (ELECTROSURGICAL) ×2
ELECTRODE BLDE 4.0 EZ CLN MEGD (MISCELLANEOUS) ×1 IMPLANT
ELECTRODE REM PT RTRN 9FT ADLT (ELECTROSURGICAL) ×1 IMPLANT
FACESHIELD WRAPAROUND (MASK) ×6 IMPLANT
FACESHIELD WRAPAROUND OR TEAM (MASK) ×3 IMPLANT
GLOVE BIO SURGEON STRL SZ7.5 (GLOVE) ×2 IMPLANT
GLOVE BIO SURGEON STRL SZ8 (GLOVE) ×2 IMPLANT
GLOVE EUDERMIC 7 POWDERFREE (GLOVE) ×2 IMPLANT
GLOVE SS BIOGEL STRL SZ 7.5 (GLOVE) ×1 IMPLANT
GLOVE SUPERSENSE BIOGEL SZ 7.5 (GLOVE) ×1
GOWN STRL REUS W/ TWL LRG LVL3 (GOWN DISPOSABLE) IMPLANT
GOWN STRL REUS W/ TWL XL LVL3 (GOWN DISPOSABLE) ×2 IMPLANT
GOWN STRL REUS W/TWL LRG LVL3 (GOWN DISPOSABLE)
GOWN STRL REUS W/TWL XL LVL3 (GOWN DISPOSABLE) ×4
HANDPIECE INTERPULSE COAX TIP (DISPOSABLE)
KIT BASIN OR (CUSTOM PROCEDURE TRAY) ×2 IMPLANT
KIT ROOM TURNOVER OR (KITS) ×2 IMPLANT
MANIFOLD NEPTUNE II (INSTRUMENTS) ×2 IMPLANT
NDL HYPO 25GX1X1/2 BEV (NEEDLE) IMPLANT
NDL SUT 6 .5 CRC .975X.05 MAYO (NEEDLE) IMPLANT
NEEDLE HYPO 25GX1X1/2 BEV (NEEDLE) IMPLANT
NEEDLE MAYO TAPER (NEEDLE)
NS IRRIG 1000ML POUR BTL (IV SOLUTION) ×2 IMPLANT
PACK SHOULDER (CUSTOM PROCEDURE TRAY) ×2 IMPLANT
PAD ARMBOARD 7.5X6 YLW CONV (MISCELLANEOUS) ×4 IMPLANT
PASSER SUT SWANSON 36MM LOOP (INSTRUMENTS) IMPLANT
PIN GUIDE 1.2 (PIN) ×1 IMPLANT
PIN GUIDE GLENOPHERE 1.5MX300M (PIN) ×1 IMPLANT
PIN METAGLENE 2.5 (PIN) ×1 IMPLANT
PRESSURIZER FEMORAL UNIV (MISCELLANEOUS) IMPLANT
RESTRICTOR CEMENT PE SZ 2 (Cement) ×1 IMPLANT
SET HNDPC FAN SPRY TIP SCT (DISPOSABLE) IMPLANT
SLING ARM LRG ADULT FOAM STRAP (SOFTGOODS) IMPLANT
SLING ARM XL FOAM STRAP (SOFTGOODS) ×2 IMPLANT
SPONGE LAP 18X18 X RAY DECT (DISPOSABLE) ×3 IMPLANT
SPONGE LAP 4X18 X RAY DECT (DISPOSABLE) IMPLANT
STRIP CLOSURE SKIN 1/2X4 (GAUZE/BANDAGES/DRESSINGS) ×2 IMPLANT
SUCTION FRAZIER TIP 10 FR DISP (SUCTIONS) ×2 IMPLANT
SUT BONE WAX W31G (SUTURE) IMPLANT
SUT FIBERWIRE #2 38 T-5 BLUE (SUTURE) ×10
SUT MNCRL AB 3-0 PS2 18 (SUTURE) ×3 IMPLANT
SUT VIC AB 1 CT1 27 (SUTURE) ×2
SUT VIC AB 1 CT1 27XBRD ANBCTR (SUTURE) ×1 IMPLANT
SUT VIC AB 2-0 CT1 27 (SUTURE) ×8
SUT VIC AB 2-0 CT1 TAPERPNT 27 (SUTURE) ×1 IMPLANT
SUT VIC AB 2-0 SH 27 (SUTURE)
SUT VIC AB 2-0 SH 27X BRD (SUTURE) IMPLANT
SUTURE FIBERWR #2 38 T-5 BLUE (SUTURE) IMPLANT
SYR 30ML SLIP (SYRINGE) ×2 IMPLANT
SYR CONTROL 10ML LL (SYRINGE) IMPLANT
TOWEL OR 17X24 6PK STRL BLUE (TOWEL DISPOSABLE) ×2 IMPLANT
TOWEL OR 17X26 10 PK STRL BLUE (TOWEL DISPOSABLE) ×2 IMPLANT
TOWER CARTRIDGE SMART MIX (DISPOSABLE) ×1 IMPLANT
TRAY FOLEY CATH 16FRSI W/METER (SET/KITS/TRAYS/PACK) IMPLANT
WATER STERILE IRR 1000ML POUR (IV SOLUTION) ×2 IMPLANT

## 2014-04-05 NOTE — Op Note (Signed)
04/05/2014  3:23 PM  PATIENT:   Hailey Levine  68 y.o. female  PRE-OPERATIVE DIAGNOSIS: SEVERELY DISPLACED 4 PART  LEFT PROXIMAL HUMERUS FRACTURE  POST-OPERATIVE DIAGNOSIS:  same  PROCEDURE:  Left reverse shoulder arthroplasty  SURGEON:  Chais Fehringer, Vania ReaKevin M. M.D.  ASSISTANTS: Shuford pac   ANESTHESIA:   GET + ISB  EBL: 150  SPECIMEN:  none  Drains: none   PATIENT DISPOSITION:  PACU - hemodynamically stable.    PLAN OF CARE: Admit to inpatient   Dictation# [id number]

## 2014-04-05 NOTE — Anesthesia Postprocedure Evaluation (Signed)
Anesthesia Post Note  Patient: Rhea BeltonMarilyn P Montesano  Procedure(s) Performed: Procedure(s) (LRB): LEFT REVERSE SHOULDER ARTHROPLASTY (Left)  Anesthesia type: general  Patient location: PACU  Post pain: Pain level controlled  Post assessment: Patient's Cardiovascular Status Stable  Last Vitals:  Filed Vitals:   04/05/14 1633  BP: 157/63  Pulse: 81  Temp: 37.6 C  Resp: 15    Post vital signs: Reviewed and stable  Level of consciousness: sedated  Complications: No apparent anesthesia complications

## 2014-04-05 NOTE — Transfer of Care (Signed)
Immediate Anesthesia Transfer of Care Note  Patient: Hailey JonesMarilyn P Levine  Procedure(s) Performed: Procedure(s): LEFT REVERSE SHOULDER ARTHROPLASTY (Left)  Patient Location: PACU  Anesthesia Type:GA combined with regional for post-op pain  Level of Consciousness: awake, alert  and oriented  Airway & Oxygen Therapy: Patient Spontanous Breathing and Patient connected to nasal cannula oxygen  Post-op Assessment: Report given to PACU RN and Post -op Vital signs reviewed and stable  Post vital signs: Reviewed and stable  Complications: No apparent anesthesia complications

## 2014-04-05 NOTE — Anesthesia Preprocedure Evaluation (Signed)
Anesthesia Evaluation  Patient identified by MRN, date of birth, ID band Patient awake    Reviewed: Allergy & Precautions, H&P , NPO status , Patient's Chart, lab work & pertinent test results  Airway Mallampati: I TM Distance: >3 FB Neck ROM: Full    Dental   Pulmonary former smoker,          Cardiovascular hypertension, Pt. on medications + CAD and + CABG     Neuro/Psych Depression    GI/Hepatic   Endo/Other  diabetes, Well Controlled, Type 2, Oral Hypoglycemic Agents  Renal/GU      Musculoskeletal   Abdominal   Peds  Hematology   Anesthesia Other Findings   Reproductive/Obstetrics                           Anesthesia Physical Anesthesia Plan  ASA: III  Anesthesia Plan: General   Post-op Pain Management:    Induction: Intravenous  Airway Management Planned: Oral ETT  Additional Equipment:   Intra-op Plan:   Post-operative Plan: Extubation in OR  Informed Consent: I have reviewed the patients History and Physical, chart, labs and discussed the procedure including the risks, benefits and alternatives for the proposed anesthesia with the patient or authorized representative who has indicated his/her understanding and acceptance.     Plan Discussed with: CRNA and Surgeon  Anesthesia Plan Comments:         Anesthesia Quick Evaluation

## 2014-04-05 NOTE — Anesthesia Procedure Notes (Signed)
Anesthesia Regional Block:  Interscalene brachial plexus block  Pre-Anesthetic Checklist: ,, timeout performed, Correct Patient, Correct Site, Correct Laterality, Correct Procedure, Correct Position, site marked, Risks and benefits discussed,  Surgical consent,  Pre-op evaluation,  At surgeon's request and post-op pain management  Laterality: Left  Prep: chloraprep       Needles:  Injection technique: Single-shot  Needle Type: Echogenic Stimulator Needle     Needle Length: 5cm 5 cm Needle Gauge: 21 and 21 G    Additional Needles:  Procedures: ultrasound guided (picture in chart) and nerve stimulator Interscalene brachial plexus block  Nerve Stimulator or Paresthesia:  Response: 0.4 mA,   Additional Responses:   Narrative:  Start time: 04/05/2014 12:55 PM End time: 04/05/2014 1:10 PM Injection made incrementally with aspirations every 5 mL.  Performed by: Personally  Anesthesiologist: Arta BruceKevin Frimy Uffelman MD  Additional Notes: Monitors applied. Patient sedated. Sterile prep and drape,hand hygiene and sterile gloves were used. Relevant anatomy identified.Needle position confirmed.Local anesthetic injected incrementally after negative aspiration. Local anesthetic spread visualized around nerve(s). Vascular puncture avoided. No complications. Image printed for medical record.The patient tolerated the procedure well.

## 2014-04-05 NOTE — Discharge Instructions (Signed)
° °  Vania ReaKevin M. Supple, M.D., F.A.A.O.S. Orthopaedic Surgery Specializing in Arthroscopic and Reconstructive Surgery of the Shoulder and Knee [phone] [address] [place], [place] [address] - Fax [phone]   POST-OP TOTAL SHOULDER REPLACEMENT/SHOULDER HEMIARTHROPLASTY INSTRUCTIONS  1. Call the office at [phone] to schedule your first post-op appointment 10-14 days from the date of your surgery.  2. The bandage over your incision is waterproof. You may begin showering with this dressing on. You may leave this dressing on until first follow up appointment within 2 weeks. If you would like to remove it you may do so after the 5th day. Go slow and tug at the borders gently to break the bond the dressing has with the skin. The steri strips may come off with the dressing. At this point if there is no drainage it is okay to go without a bandage or you may cover it with a light guaze and tape. Leave the steri-strips in place over your incision. You can expect drainage that is bloody or yellow in nature that should gradually decrease from day of surgery. Change your dressing daily until drainage is completely resolved, then you may feel free to go without a bandage. You can also expect significant bruising around your shoulder that will drift down your arm and into your chest wall. This is very normal and should resolve over several days.   3. Wear your sling/immobilizer at all times except to perform the exercises below or to occasionally let your arm dangle by your side to stretch your elbow. You also need to sleep in your sling immobilizer until instructed otherwise.  4. Range of motion to your elbow, wrist, and hand are encouraged 3-5 times daily. Exercise to your hand and fingers helps to reduce swelling you may experience.  5. Utilize ice to the shoulder 3-5 times minimum a day and additionally if you are experiencing pain.  6. Prescriptions for a pain medication and a muscle  relaxant are provided for you. It is recommended that if you are experiencing pain that you pain medication alone is not controlling, add the muscle relaxant along with the pain medication which can give additional pain relief. The first 1-2 days is generally the most severe of your pain and then should gradually decrease. As your pain lessens it is recommended that you decrease your use of the pain medications to an "as needed basis'" only and to always comply with the recommended dosages of the pain medications.  7. Pain medications can produce constipation along with their use. If you experience this, the use of an over the counter stool softener or laxative daily is recommended.    8. For additional questions or concerns, please do not hesitate to call the office. If after hours there is an answering service to forward your concerns to the physician on call.  POST-OP EXERCISES   May allow arm to dangle and move elbow wrist and hand as instructed.

## 2014-04-05 NOTE — Progress Notes (Signed)
PHARMACIST - PHYSICIAN COMMUNICATION DR:   Rennis ChrisSupple  CONCERNING:  Contraindicated to use ketorolac and probenecid concomitantly  RECOMMENDATION: Probenecid has been held until ketorolac therapy is finished.  DESCRIPTION: Probenecid increases the level and effect of ketorolac which could harm the patient.  CollyerJennifer Frizzleburg, 17[address] Rainbow BoulevardPharm.D., BCPS Clinical Pharmacist Pager: [phone] 04/05/2014 4:47 PM

## 2014-04-05 NOTE — H&P (Signed)
Hailey BeltonMarilyn P Levine    Chief Complaint: LEFT PROXIMAL HUMERUS FRACTURE HPI: The patient is a 68 y.o. female with a severely displaced left 4 part proximal humerus fracture   Past Medical History  Diagnosis Date  . Hypertension   . Coronary artery disease   . Myocardial infarction 2002    MI x 2 - one prior to 2002  . Diabetes mellitus without complication   . Depression   . Arthritis     Past Surgical History  Procedure Laterality Date  . Abdominal hysterectomy    . Ovarian cyst removal    . Tubal ligation    . Coronary artery bypass graft  2002  . Coronary angioplasty with stent placement  2003  . Cholecystectomy    . Eye surgery Bilateral     cataract    Family History  Problem Relation Age of Onset  . Heart attack Father [id number]    Social History:  reports that she quit smoking about 15 years ago. She does not have any smokeless tobacco history on file. She reports that she does not drink alcohol or use illicit drugs.  Allergies:  Allergies  Allergen Reactions  . Percocet [Oxycodone-Acetaminophen] Nausea And Vomiting  . Metoprolol     "Throws up white foamy liquid"    Medications Prior to Admission  Medication Sig Dispense Refill  . ALPRAZolam (XANAX) 0.25 MG tablet Take 0.25 mg by mouth 3 (three) times daily as needed for anxiety.       Marland Kitchen. amLODipine (NORVASC) 5 MG tablet Take 10 mg by mouth daily.       Marland Kitchen. aspirin 81 MG tablet Take 81 mg by mouth daily.      Marland Kitchen. atorvastatin (LIPITOR) 40 MG tablet Take 40 mg by mouth daily.      . carvedilol (COREG) 25 MG tablet Take 25 mg by mouth 2 (two) times daily with a meal.      . cycloSPORINE (RESTASIS) 0.05 % ophthalmic emulsion Place 1 drop into both eyes 2 (two) times daily.      . hydrochlorothiazide (HYDRODIURIL) 25 MG tablet Take 25 mg by mouth daily.      Marland Kitchen. HYDROcodone-acetaminophen (NORCO/VICODIN) 5-325 MG per tablet Take 2 tablets by mouth every 4 (four) hours as needed for moderate pain.      Marland Kitchen. losartan (COZAAR) 100  MG tablet Take 100 mg by mouth daily.      . metFORMIN (GLUCOPHAGE-XR) 500 MG 24 hr tablet Take 500 mg by mouth 2 (two) times daily.      . Multiple Vitamins-Minerals (CENTRUM SILVER PO) Take 1 tablet by mouth daily.       . Omega-3 Fatty Acids (OMEGA 3 PO) Take 2 tablets by mouth daily.      . probenecid (BENEMID) 500 MG tablet Take 500 mg by mouth 2 (two) times daily.      Marland Kitchen. oxyCODONE-acetaminophen (PERCOCET/ROXICET) 5-325 MG per tablet Take 2 tablets by mouth every 4 (four) hours as needed for severe pain.  15 tablet  0     Physical Exam: left shoulder with swelling and ecchymosis as noted at recent office visit, remains grossly N/V intact  Vitals  Temp:  [98.4 F (36.9 C)] 98.4 F (36.9 C) (04/14 1153) Pulse Rate:  [70-76] 74 (04/14 1300) Resp:  [11-23] 23 (04/14 1300) BP: (107-165)/(32-72) 118/43 mmHg (04/14 1300) SpO2:  [99 %-100 %] 100 % (04/14 1300) Weight:  [68.402 kg (150 lb 12.8 oz)] 68.402 kg (150 lb 12.8 oz) (04/14 1153)  Assessment/Plan  Impression: LEFT PROXIMAL HUMERUS FRACTURE  Plan of Action: Procedure(s): LEFT REVERSE SHOULDER ARTHROPLASTY  Hailey Levine 04/05/2014, 1:12 PM

## 2014-04-06 ENCOUNTER — Encounter (HOSPITAL_COMMUNITY): Payer: Self-pay | Admitting: Orthopedic Surgery

## 2014-04-06 LAB — GLUCOSE, CAPILLARY
Glucose-Capillary: 160 mg/dL — ABNORMAL HIGH (ref 70–99)
Glucose-Capillary: 157 mg/dL — ABNORMAL HIGH (ref 70–99)

## 2014-04-06 MED ORDER — DIAZEPAM 5 MG PO TABS: 2.5000 mg | ORAL_TABLET | Freq: Four times a day (QID) | ORAL | Status: DC | PRN

## 2014-04-06 MED ORDER — HYDROCODONE-ACETAMINOPHEN 5-325 MG PO TABS: 1.0000 | ORAL_TABLET | ORAL | Status: DC | PRN
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
## 2014-04-06 NOTE — Progress Notes (Signed)
Utilization review completed.  
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
## 2014-04-08 ENCOUNTER — Encounter (HOSPITAL_COMMUNITY): Payer: Self-pay | Admitting: Emergency Medicine

## 2014-04-08 ENCOUNTER — Emergency Department (HOSPITAL_COMMUNITY)
Admission: EM | Admit: 2014-04-08 | Discharge: 2014-04-08 | Disposition: A | Discharge status: Home / Self Care | Payer: Medicare Other | Attending: Emergency Medicine | Admitting: Emergency Medicine

## 2014-04-08 DIAGNOSIS — Z9889 Other specified postprocedural states: Secondary | ICD-10-CM | POA: Insufficient documentation

## 2014-04-08 DIAGNOSIS — Z79899 Other long term (current) drug therapy: Secondary | ICD-10-CM | POA: Insufficient documentation

## 2014-04-08 DIAGNOSIS — E119 Type 2 diabetes mellitus without complications: Secondary | ICD-10-CM | POA: Insufficient documentation

## 2014-04-08 DIAGNOSIS — Y838 Other surgical procedures as the cause of abnormal reaction of the patient, or of later complication, without mention of misadventure at the time of the procedure: Secondary | ICD-10-CM | POA: Insufficient documentation

## 2014-04-08 DIAGNOSIS — I252 Old myocardial infarction: Secondary | ICD-10-CM | POA: Insufficient documentation

## 2014-04-08 DIAGNOSIS — I2510 Atherosclerotic heart disease of native coronary artery without angina pectoris: Secondary | ICD-10-CM | POA: Insufficient documentation

## 2014-04-08 DIAGNOSIS — IMO0002 Reserved for concepts with insufficient information to code with codable children: Secondary | ICD-10-CM | POA: Insufficient documentation

## 2014-04-08 DIAGNOSIS — F329 Major depressive disorder, single episode, unspecified: Secondary | ICD-10-CM | POA: Insufficient documentation

## 2014-04-08 DIAGNOSIS — M7989 Other specified soft tissue disorders: Secondary | ICD-10-CM

## 2014-04-08 DIAGNOSIS — I10 Essential (primary) hypertension: Secondary | ICD-10-CM | POA: Insufficient documentation

## 2014-04-08 DIAGNOSIS — Z9861 Coronary angioplasty status: Secondary | ICD-10-CM | POA: Insufficient documentation

## 2014-04-08 DIAGNOSIS — Z87891 Personal history of nicotine dependence: Secondary | ICD-10-CM | POA: Insufficient documentation

## 2014-04-08 DIAGNOSIS — F3289 Other specified depressive episodes: Secondary | ICD-10-CM | POA: Insufficient documentation

## 2014-04-08 DIAGNOSIS — Z951 Presence of aortocoronary bypass graft: Secondary | ICD-10-CM | POA: Insufficient documentation

## 2014-04-08 DIAGNOSIS — Z8781 Personal history of (healed) traumatic fracture: Secondary | ICD-10-CM | POA: Insufficient documentation

## 2014-04-08 DIAGNOSIS — Z7982 Long term (current) use of aspirin: Secondary | ICD-10-CM | POA: Insufficient documentation

## 2014-04-08 DIAGNOSIS — M129 Arthropathy, unspecified: Secondary | ICD-10-CM | POA: Insufficient documentation

## 2014-04-08 NOTE — ED Notes (Signed)
Per pt, had shoulder surgery on the 14 th-left arm/hand swelling-states she was worried she might have blot clot

## 2014-04-08 NOTE — Discharge Instructions (Signed)
Please read and follow all provided instructions.  Your diagnoses today include:  1. Left arm swelling     Tests performed today include:  Vital signs. See below for your results today.   Medications prescribed:   None  Home care instructions:  Follow any educational materials contained in this packet.  Follow-up instructions: Please follow-up with your primary care provider as needed for further evaluation of your symptoms.  If you do not have a primary care doctor -- see below for referral information.   Return instructions:   Please return to the Emergency Department if you experience worsening symptoms.   Return with chest pain or shortness of breath.   Please return if you have any other emergent concerns.  Additional Information:  Your vital signs today were: BP 156/46   Pulse 84   Temp(Src) 98.3 F (36.8 C) (Oral)   Resp 16   SpO2 96% If your blood pressure (BP) was elevated above 135/85 this visit, please have this repeated by your doctor within one month. ---------------

## 2014-04-08 NOTE — ED Provider Notes (Signed)
Medical screening examination/treatment/procedure(s) were conducted as a shared visit with non-physician practitioner(s) and myself.  I personally evaluated the patient during the encounter.   EKG Interpretation None      Patient presenting with worsening left arm swelling 3 days after a humerus fracture repair. She has nonpitting edema of the arm with normal sensation and pulse. Low suspicion for DVT at this time and feel this is most likely expected edema after the surgery she underwent.  Will have patient set up for duplex in the morning and follow up with Dr. supple for worsening symptoms.  Gwyneth SproutWhitney Flynn Lininger, MD 04/08/14 2138

## 2014-04-08 NOTE — ED Provider Notes (Signed)
CSN: [id number]     Arrival date & time 04/08/14  1841 History   First MD Initiated Contact with Patient 04/08/14 1958     Chief Complaint  Patient presents with  . Post-op Problem     (Consider location/radiation/quality/duration/timing/severity/associated sxs/prior Treatment) HPI Comments: Patient 3 day s/p left humerus surgery after a complex fracture following a fall. Patient noted that after she had her symptoms this afternoon, she had increasing swelling in her left forearm and hand. She was concerned that she was developing a blood clot. She denies numbness or tingling. She denies worsening pain or new injury. Patient does not have history of blood clots. Onset of symptoms gradual. Course is constant. Nothing makes symptoms better or worse.  The history is provided by the patient and medical records.    Past Medical History  Diagnosis Date  . Hypertension   . Coronary artery disease   . Myocardial infarction 2002    MI x 2 - one prior to 2002  . Diabetes mellitus without complication   . Depression   . Arthritis    Past Surgical History  Procedure Laterality Date  . Abdominal hysterectomy    . Ovarian cyst removal    . Tubal ligation    . Coronary artery bypass graft  2002  . Coronary angioplasty with stent placement  2003  . Cholecystectomy    . Eye surgery Bilateral     cataract  . Reverse shoulder arthroplasty Left 04/05/2014    Procedure: LEFT REVERSE SHOULDER ARTHROPLASTY;  Surgeon: Senaida LangeKevin M Supple, MD;  Location: [facility] OR;  Service: Orthopedics;  Laterality: Left;   Family History  Problem Relation Age of Onset  . Heart attack Father 4745   History  Substance Use Topics  . Smoking status: Former Smoker -- 36 years    Quit date: 12/16/1998  . Smokeless tobacco: Not on file  . Alcohol Use: No   OB History   Grav Para Term Preterm Abortions TAB SAB Ect Mult Living                 Review of Systems  Constitutional: Positive for activity change.  Respiratory:  Negative for shortness of breath.   Cardiovascular: Negative for chest pain.  Musculoskeletal: Positive for arthralgias and myalgias. Negative for back pain, joint swelling and neck pain.  Skin: Negative for wound.  Neurological: Negative for weakness and numbness.      Allergies  Percocet and Metoprolol  Home Medications   Prior to Admission medications   Medication Sig Start Date End Date Taking? Authorizing Provider  ALPRAZolam (XANAX) 0.25 MG tablet Take 0.25 mg by mouth 3 (three) times daily as needed for anxiety.   Yes Historical Provider, MD  amLODipine (NORVASC) 5 MG tablet Take 5 mg by mouth daily.    Yes Historical Provider, MD  aspirin 81 MG tablet Take 81 mg by mouth daily.   Yes Historical Provider, MD  atorvastatin (LIPITOR) 40 MG tablet Take 40 mg by mouth daily.   Yes Historical Provider, MD  carvedilol (COREG) 25 MG tablet Take 25 mg by mouth 2 (two) times daily with a meal.   Yes Historical Provider, MD  cycloSPORINE (RESTASIS) 0.05 % ophthalmic emulsion Place 1 drop into both eyes 2 (two) times daily.   Yes Historical Provider, MD  hydrochlorothiazide (HYDRODIURIL) 25 MG tablet Take 25 mg by mouth daily.   Yes Historical Provider, MD  HYDROcodone-acetaminophen (NORCO) 5-325 MG per tablet Take 1-2 tablets by mouth every 4 (four)  hours as needed. 04/06/14  Yes Tracy Shuford, PA-C  losartan (COZAAR) 100 MG tablet Take 100 mg by mouth daily.   Yes Historical Provider, MD  metFORMIN (GLUCOPHAGE-XR) 500 MG 24 hr tablet Take 1,000 mg by mouth daily.    Yes Historical Provider, MD  methocarbamol (ROBAXIN) 500 MG tablet Take 500 mg by mouth every 6 (six) hours as needed for muscle spasms.   Yes Historical Provider, MD  Multiple Vitamins-Minerals (CENTRUM SILVER PO) Take 1 tablet by mouth daily.    Yes Historical Provider, MD  Omega-3 Fatty Acids (OMEGA 3 PO) Take 2 tablets by mouth daily.   Yes Historical Provider, MD  probenecid (BENEMID) 500 MG tablet Take 500 mg by mouth 2  (two) times daily.   Yes Historical Provider, MD   BP 156/46  Pulse 84  Temp(Src) 98.3 F (36.8 C) (Oral)  Resp 16  SpO2 96% Physical Exam  Nursing note and vitals reviewed. Constitutional: She appears well-developed and well-nourished.  HENT:  Head: Normocephalic and atraumatic.  Eyes: Pupils are equal, round, and reactive to light.  Neck: Normal range of motion. Neck supple.  Cardiovascular: Exam reveals no decreased pulses.   Pulses:      Radial pulses are 2+ on the right side, and 2+ on the left side.  Patient has synthetic nails but fingers appear well perfused.   Musculoskeletal: She exhibits tenderness. She exhibits no edema.  Generalized swelling without redness of left upper arm, forearm, and hand.   Neurological: She is alert. No sensory deficit.  Motor, sensation, and vascular distal to the injury is fully intact.   Skin: Skin is warm and dry.  Psychiatric: She has a normal mood and affect.    ED Course  Procedures (including critical care time) Labs Review Labs Reviewed - No data to display  Imaging Review No results found.   EKG Interpretation None      8:15 PM Patient seen and examined. Patient discussed with Dr. Anitra LauthPlunkett who will see.    Vital signs reviewed and are as follows: Filed Vitals:   04/08/14 1910  BP: 156/46  Pulse: 84  Temp: 98.3 F (36.8 C)  Resp: 16   8:32 PM Seen with Dr. Anitra LauthPlunkett. Will d/c to home. Outpatient doppler ordered for tomorrow morning to definitively r/o DVT. At this point very low suspicion for clot. Do not feel benefit of lovenox outweights the risks at this point. Pt informed. Patient urged to return with worsening symptoms or other concerns. Patient verbalized understanding and agrees with plan.    MDM   Final diagnoses:  Left arm swelling   Left arm swelling 3 days post-op. Normal sensation, radial pulses. Feel swelling is likely related to surgery. Very low suspicion for DVT. Outpatient doppler ordered for  definitive rule-out.     Renne CriglerJoshua Kasee Hantz, PA-C 04/08/14 2035

## 2014-04-08 NOTE — ED Notes (Signed)
PA at bedside at this time.  

## 2014-04-09 ENCOUNTER — Ambulatory Visit (HOSPITAL_COMMUNITY): Payer: Medicare Other | Attending: Emergency Medicine

## 2014-04-15 ENCOUNTER — Ambulatory Visit: Payer: Self-pay | Admitting: Nurse Practitioner

## 2014-07-15 IMAGING — CT CT HEAD W/O CM
1 series · 15 of 30 positions shown, 19 images · non-contrast
Comparison: Prior CT from 03/27/2004

CLINICAL DATA: Injury

EXAM:
CT HEAD WITHOUT CONTRAST
TECHNIQUE: Contiguous axial images were obtained from the base of the skull
through the vertex without intravenous contrast.

[Series 2: head 4.8 h37s · axial · 0.44mm/px · z∈[+972,+1131]mm · 15 of 35 slices shown, 19 images]
[im 2/35  brain]
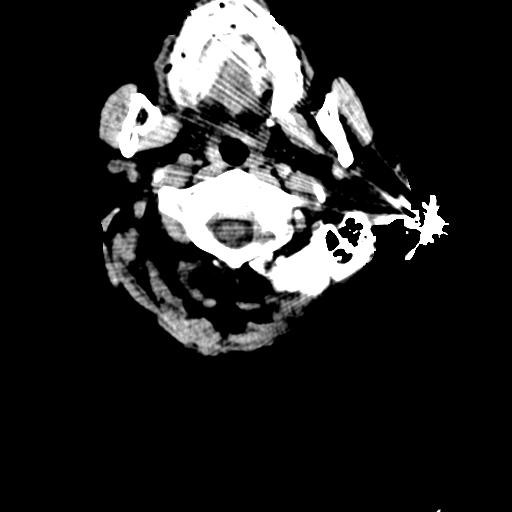
[im 2/35  bone]
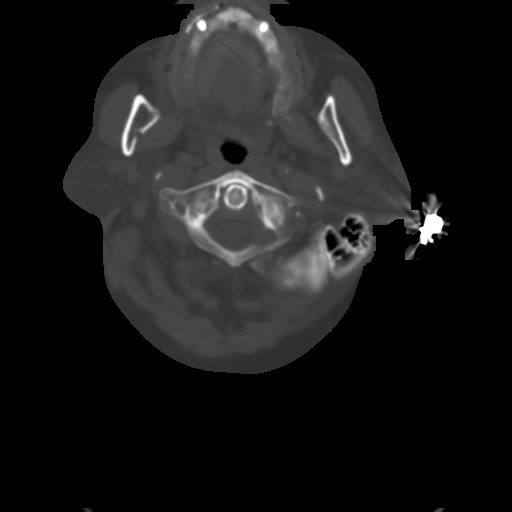
[im 4/35  brain]
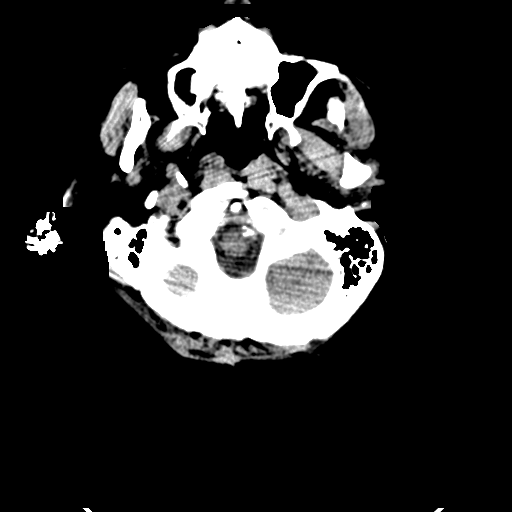
[im 6/35  brain]
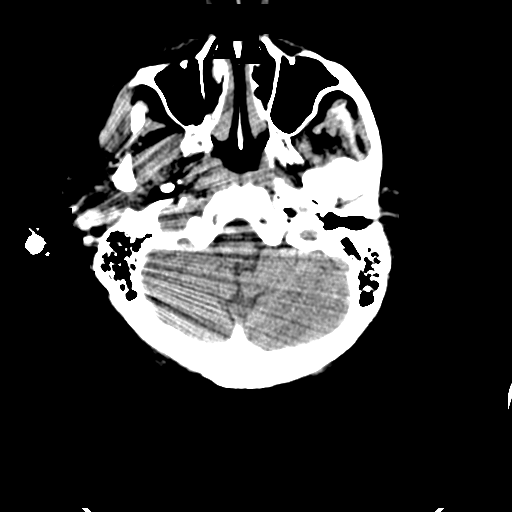
[im 9/35  brain]
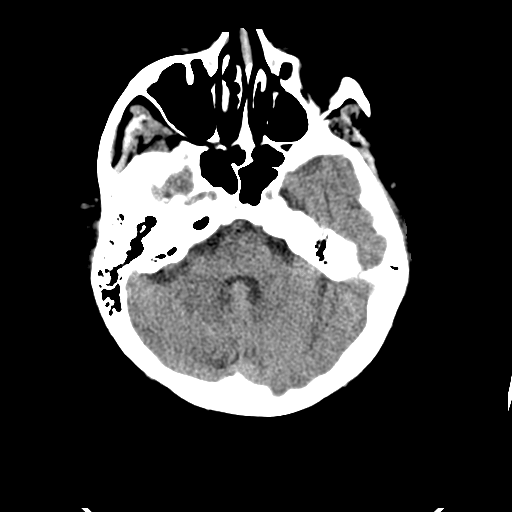
[im 11/35  brain]
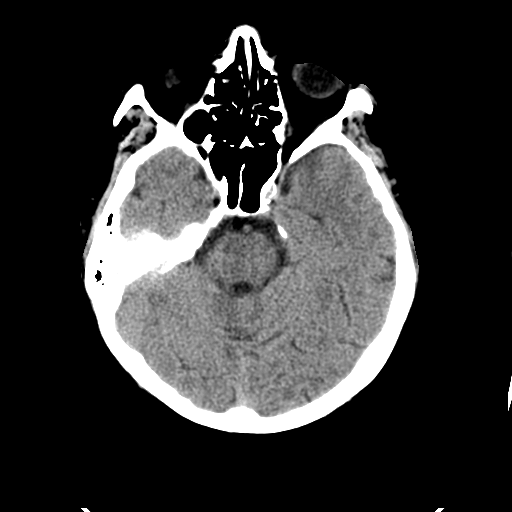
[im 11/35  bone]
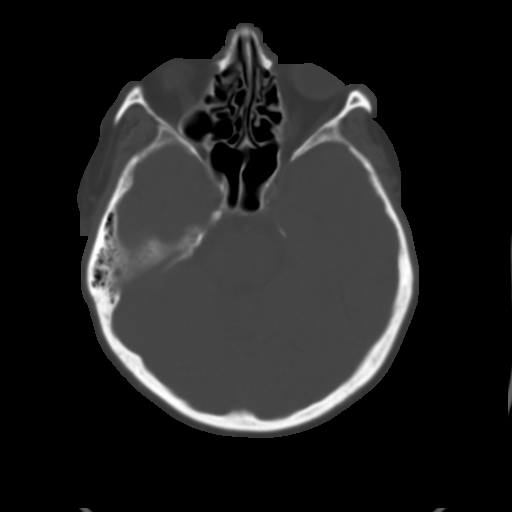
[im 13/35  brain]
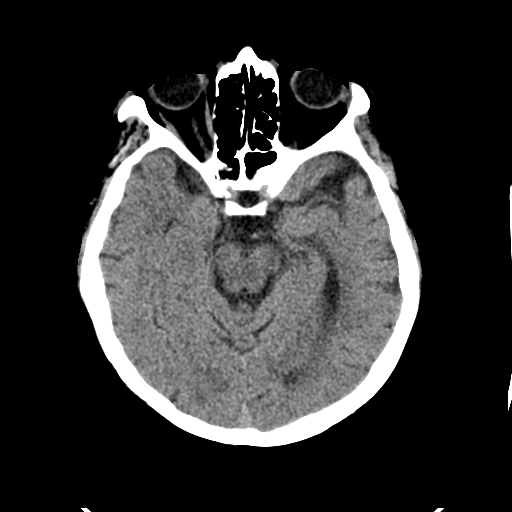
[im 16/35  brain]
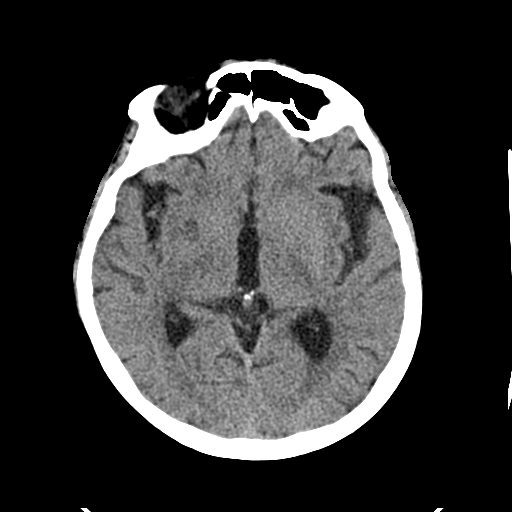
[im 18/35  brain]
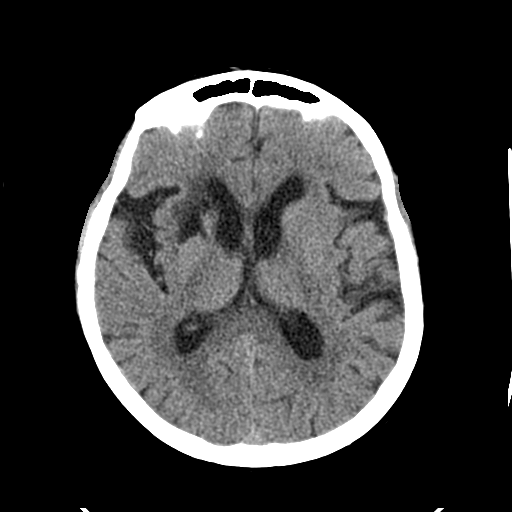
[im 19/35  brain]
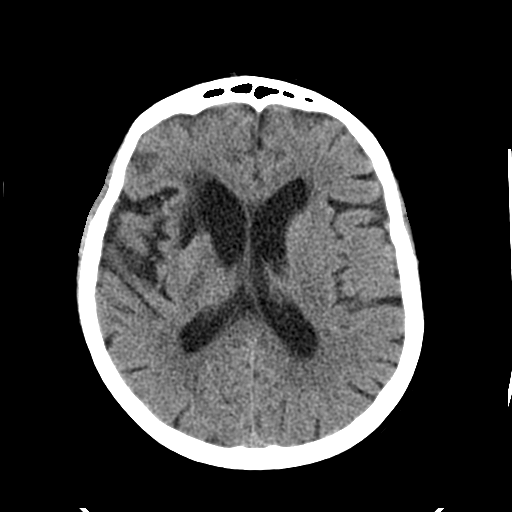
[im 19/35  bone]
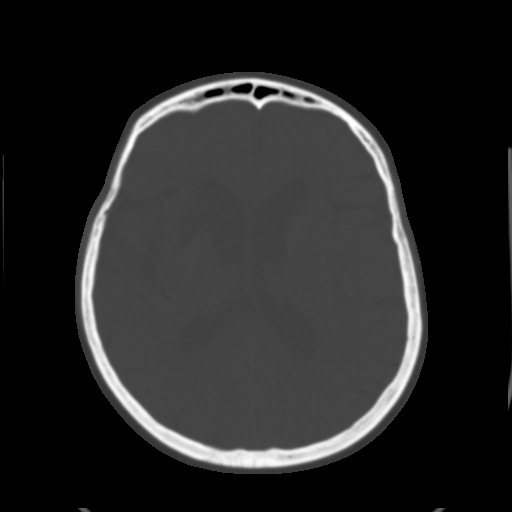
[im 22/35  brain]
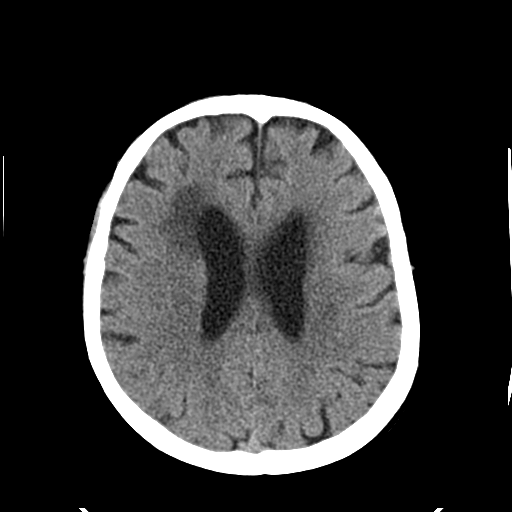
[im 24/35  brain]
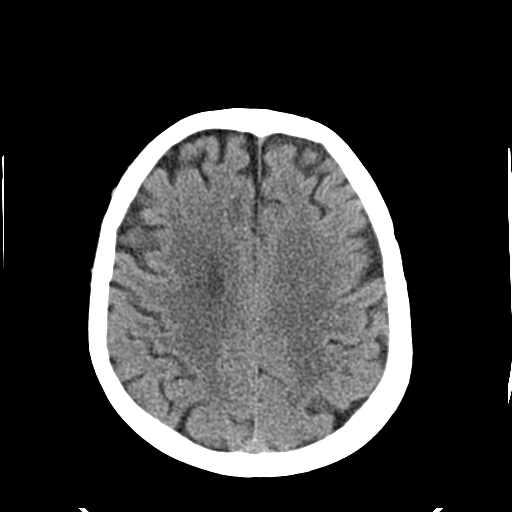
[im 26/35  brain]
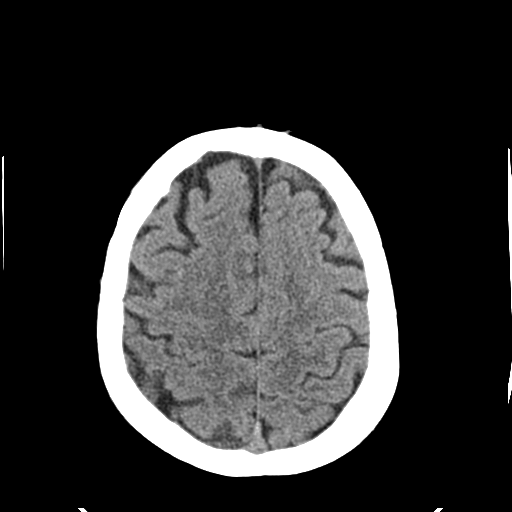
[im 29/35  brain]
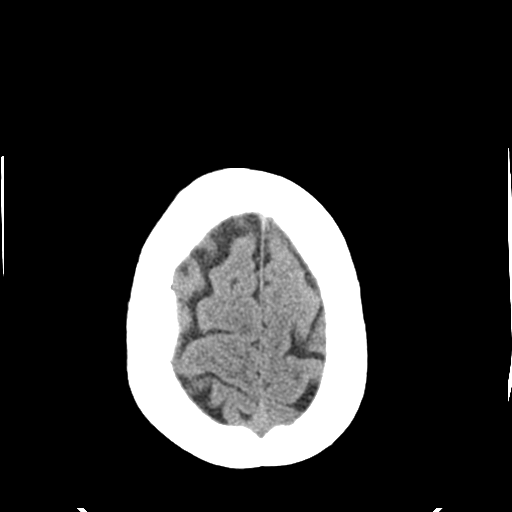
[im 29/35  bone]
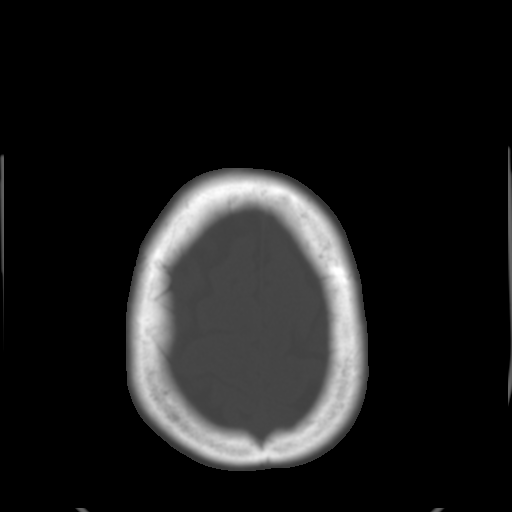
[im 31/35  brain]
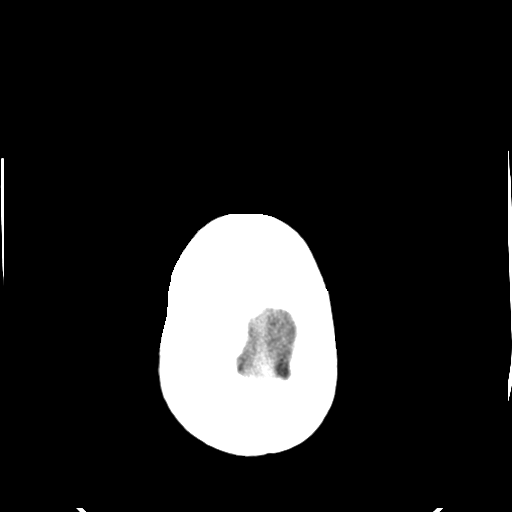
[im 33/35  brain]
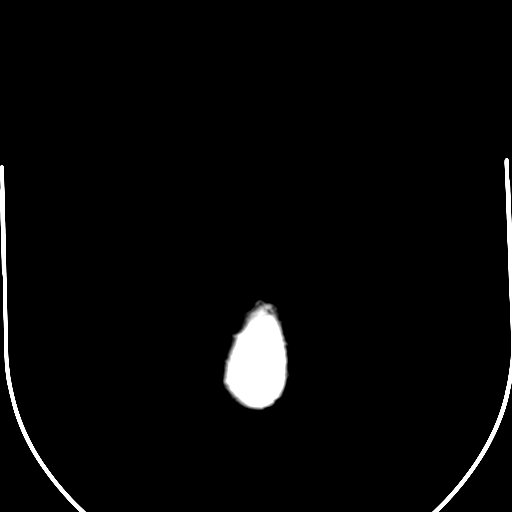

[15 of 30 positions shown; findings below may reference images not displayed]

FINDINGS: Remote right basal ganglia infarct again noted, unchanged. There is
associated ex vacuo dilatation of the frontal horn of the right
lateral ventricle. Scattered and confluent hypodensity within the
periventricular white matter is most compatible with mild chronic
microvascular ischemic changes.

There is no acute intracranial hemorrhage or infarct. No mass lesion
or midline shift. Gray-white matter differentiation is well
maintained. Ventricles are normal in size without evidence of
hydrocephalus. CSF containing spaces are within normal limits. No
extra-axial fluid collection.

The calvarium is intact.

Orbital soft tissues are within normal limits. Bilateral lens
implants noted.

The paranasal sinuses and mastoid air cells are well pneumatized and
free of fluid.

Scalp soft tissues are unremarkable.
IMPRESSION: 1. No acute intracranial process.
2. Remote right basal ganglia infarct, unchanged.
3. Mild chronic microvascular ischemic disease involving the
supratentorial white matter.

## 2015-04-25 ENCOUNTER — Ambulatory Visit (INDEPENDENT_AMBULATORY_CARE_PROVIDER_SITE_OTHER): Payer: Medicare Other | Admitting: Interventional Cardiology

## 2015-04-25 ENCOUNTER — Encounter: Payer: Self-pay | Admitting: Interventional Cardiology

## 2015-04-25 VITALS — BP 144/78 | HR 66 | Ht 58.5 in | Wt 156.4 lb

## 2015-04-25 DIAGNOSIS — E785 Hyperlipidemia, unspecified: Secondary | ICD-10-CM | POA: Diagnosis not present

## 2015-04-25 DIAGNOSIS — I10 Essential (primary) hypertension: Secondary | ICD-10-CM

## 2015-04-25 DIAGNOSIS — I2510 Atherosclerotic heart disease of native coronary artery without angina pectoris: Secondary | ICD-10-CM | POA: Diagnosis not present

## 2015-04-25 DIAGNOSIS — I2583 Coronary atherosclerosis due to lipid rich plaque: Principal | ICD-10-CM
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
## 2015-04-25 NOTE — Patient Instructions (Signed)

## 2016-02-13 DIAGNOSIS — I129 Hypertensive chronic kidney disease with stage 1 through stage 4 chronic kidney disease, or unspecified chronic kidney disease: Secondary | ICD-10-CM

## 2016-02-13 DIAGNOSIS — N183 Chronic kidney disease, stage 3 unspecified: Secondary | ICD-10-CM | POA: Insufficient documentation

## 2016-02-13 DIAGNOSIS — Z9862 Peripheral vascular angioplasty status: Secondary | ICD-10-CM | POA: Insufficient documentation

## 2016-02-13 DIAGNOSIS — Z951 Presence of aortocoronary bypass graft: Secondary | ICD-10-CM | POA: Insufficient documentation

## 2016-02-13 DIAGNOSIS — M109 Gout, unspecified: Secondary | ICD-10-CM | POA: Insufficient documentation

## 2016-02-13 DIAGNOSIS — E1122 Type 2 diabetes mellitus with diabetic chronic kidney disease: Secondary | ICD-10-CM | POA: Insufficient documentation

## 2016-04-29 ENCOUNTER — Encounter: Payer: Self-pay | Admitting: Interventional Cardiology

## 2016-04-29 ENCOUNTER — Ambulatory Visit (INDEPENDENT_AMBULATORY_CARE_PROVIDER_SITE_OTHER): Payer: Medicare Other | Admitting: Interventional Cardiology

## 2016-04-29 VITALS — BP 166/72 | HR 64 | Ht <= 58 in | Wt 160.0 lb

## 2016-04-29 DIAGNOSIS — I10 Essential (primary) hypertension: Secondary | ICD-10-CM | POA: Diagnosis not present

## 2016-04-29 DIAGNOSIS — I2510 Atherosclerotic heart disease of native coronary artery without angina pectoris: Secondary | ICD-10-CM | POA: Diagnosis not present

## 2016-04-29 DIAGNOSIS — I2583 Coronary atherosclerosis due to lipid rich plaque: Principal | ICD-10-CM

## 2016-04-29 DIAGNOSIS — E785 Hyperlipidemia, unspecified: Secondary | ICD-10-CM

## 2016-04-29 MED ORDER — FUROSEMIDE 40 MG PO TABS: 40.0000 mg | ORAL_TABLET | Freq: Every day | ORAL | Status: DC

## 2016-04-29 NOTE — Progress Notes (Signed)
Cardiology Office Note   Date:  04/29/2016   ID:  Hailey Levine, DOB [birth year], MRN [id number]  PCP:  Hailey Boys, MD  Cardiologist:  Lesleigh Noe, MD   Chief Complaint  Patient presents with  . Coronary Artery Disease      History of Present Illness: Hailey Levine is a 70 y.o. female who presents for Hypertension, hyperlipidemia, coronary artery disease with prior coronary stent, and diabetes mellitus.  Hailey Levine has no cardiopulmonary complaints of the lower extremity swelling and some dyspnea on exertion. She denies angina. She has not needed to use nitroglycerin. No episodes of syncope or prolonged palpitations have occurred.    Past Medical History  Diagnosis Date  . Hypertension   . Coronary artery disease   . Myocardial infarction (HCC) 2002    MI x 2 - one prior to 2002  . Diabetes mellitus without complication (HCC)   . Depression   . Arthritis     Past Surgical History  Procedure Laterality Date  . Abdominal hysterectomy    . Ovarian cyst removal    . Tubal ligation    . Coronary artery bypass graft  2002  . Coronary angioplasty with stent placement  2003  . Cholecystectomy    . Eye surgery Bilateral     cataract  . Reverse shoulder arthroplasty Left 04/05/2014    Procedure: LEFT REVERSE SHOULDER ARTHROPLASTY;  Surgeon: Senaida Lange, MD;  Location: [facility] OR;  Service: Orthopedics;  Laterality: Left;     Current Outpatient Prescriptions  Medication Sig Dispense Refill  . ALPRAZolam (XANAX) 0.25 MG tablet Take 0.25 mg by mouth 3 (three) times daily as needed for anxiety.    Marland Kitchen amLODipine (NORVASC) 10 MG tablet Take 5 mg by mouth daily.    Marland Kitchen aspirin 81 MG tablet Take 81 mg by mouth daily.    Marland Kitchen atorvastatin (LIPITOR) 40 MG tablet Take 40 mg by mouth daily.    . canagliflozin (INVOKANA) 100 MG TABS tablet Take 100 mg by mouth daily before breakfast.    . carvedilol (COREG) 25 MG tablet Take 25 mg by mouth 2 (two) times daily with a meal.     . cycloSPORINE (RESTASIS) 0.05 % ophthalmic emulsion Place 1 drop into both eyes 2 (two) times daily.    . hydrALAZINE (APRESOLINE) 25 MG tablet Take 25 mg by mouth 3 (three) times daily.    . hydrochlorothiazide (HYDRODIURIL) 25 MG tablet Take 25 mg by mouth daily.    Marland Kitchen losartan (COZAAR) 100 MG tablet Take 100 mg by mouth daily.    . metFORMIN (GLUCOPHAGE-XR) 500 MG 24 hr tablet Take 1,000 mg by mouth daily.     . nitroGLYCERIN (NITROSTAT) 0.4 MG SL tablet Place 0.4 mg under the tongue every 5 (five) minutes x 3 doses as needed. (chest pain)    . Omega-3 Fatty Acids (OMEGA 3 PO) Take 2 tablets by mouth daily.    . probenecid (BENEMID) 500 MG tablet Take 500 mg by mouth 2 (two) times daily.     No current facility-administered medications for this visit.    Allergies:   Amlodipine besy-benazepril hcl; Metoprolol; and Percocet    Social History:  The patient  reports that she quit smoking about 17 years ago. She has never used smokeless tobacco. She reports that she does not drink alcohol or use illicit drugs.   Family History:  The patient's family history includes Heart attack (age of onset: 45) in her father.  ROS:  Please see the history of present illness.   Otherwise, review of systems are positive for Increasing weight, easy bruisability, joint swelling, leg swelling, and unexplained weight gain..   All other systems are reviewed and negative.    PHYSICAL EXAM: VS:  BP 166/72 mmHg  Pulse 64  Ht 4\' 10"  (1.473 m)  Wt 160 lb (72.576 kg)  BMI 33.45 kg/m2 , BMI Body mass index is 33.45 kg/(m^2). GEN: Well nourished, well developed, in no acute distress HEENT: normal Neck: no JVD, carotid bruits, or masses Cardiac: R RR.  There is no murmur, rub, or gallop. There is 2+ bilateral ankle edema. Respiratory:  clear to auscultation bilaterally, normal work of breathing. GI: soft, nontender, nondistended, + BS MS: no deformity or atrophy Skin: warm and dry, no rash Neuro:  Strength  and sensation are intact Psych: euthymic mood, full affect   EKG:  EKG is ordered today. The ekg reveals normal sinus rhythm lateral T wave inversion, poor R-wave progression, and with compared to prior tracings, no change has occurred.   Recent Labs: No results found for requested labs within last 365 days.    Lipid Panel No results found for: CHOL, TRIG, HDL, CHOLHDL, VLDL, LDLCALC, LDLDIRECT    Wt Readings from Last 3 Encounters:  04/29/16 160 lb (72.576 kg)  04/25/15 156 lb 6.4 oz (70.943 kg)  04/05/14 150 lb 12.8 oz (68.402 kg)      Other studies Reviewed: Additional studies/ records that were reviewed today include: Recent laboratory data is not available in the [facility] she is followed at [facility] [place].. The findings include no data available.    ASSESSMENT AND PLAN:  1. Coronary artery disease due to lipid rich plaque Asymptomatic  2. Essential hypertension Poor control with evidence of volume overload. Hydralazine will be a terrible medicine for her over time since she already has arthritis. We will intensify her diuretic regimen and hopefully this better controls her blood pressure. Hydrochlorothiazide will be discontinued and furosemide started. I also agree with decrease in the amlodipine dose to help with ankle edema, from 10 mg to 5 mg daily.  3. Hyperlipidemia Followed by primary care  4. Chronic diastolic left ventricular dysfunction/failure Volume overload is present    Current medicines are reviewed at length with the patient today.  The patient has the following concerns regarding medicines: .  The following changes/actions have been instituted:    Discontinue hydralazine  Discontinue hydrochlorothiazide  *Furosemide 40 mg daily  Basic metabolic panel in one to 3 weeks  Labs/ tests ordered today include:  No orders of the defined types were placed in this encounter.     Disposition:   FU with HS in 6  weeks  Signed, Lesleigh NoeSMITH III,HENRY W, MD  04/29/2016 10:43 AM    [facility] [address], [place], [place]  [address] Phone: [phone]) [phone]; Fax: [phone]

## 2016-04-29 NOTE — Patient Instructions (Addendum)
Medication Instructions:  Your physician has recommended you make the following change in your medication:  1) START Lasix 40mg  daily. An Rx has been sent to your pharmacy 2) STOP Hydralazine 3) STOP HCTZ  Labwork: Your physician recommends that you return for lab work on 05/15/16 ( [facility])   Testing/Procedures: Lyanne CoNonne ordered  Follow-Up: You have a follow up appointment scheduled on 06/12/16 @ 12pm  Any Other Special Instructions Will Be Listed Below (If Applicable).     If you need a refill on your cardiac medications before your next appointment, please call your pharmacy.

## 2016-05-15 ENCOUNTER — Other Ambulatory Visit: Payer: Medicare Other

## 2016-05-17 ENCOUNTER — Other Ambulatory Visit (INDEPENDENT_AMBULATORY_CARE_PROVIDER_SITE_OTHER): Payer: Medicare Other | Admitting: *Deleted

## 2016-05-17 DIAGNOSIS — I10 Essential (primary) hypertension: Secondary | ICD-10-CM

## 2016-05-17 LAB — BASIC METABOLIC PANEL
BUN: 32 mg/dL — AB (ref 7–25)
CALCIUM: 9.6 mg/dL (ref 8.6–10.4)
CHLORIDE: 98 mmol/L (ref 98–110)
CO2: 24 mmol/L (ref 20–31)
CREATININE: 1.65 mg/dL — AB (ref 0.60–0.93)
GLUCOSE: 170 mg/dL — AB (ref 65–99)
Potassium: 4.7 mmol/L (ref 3.5–5.3)
Sodium: 140 mmol/L (ref 135–146)

## 2016-05-24 ENCOUNTER — Telehealth: Payer: Self-pay | Admitting: Interventional Cardiology

## 2016-05-24 NOTE — Telephone Encounter (Signed)
Patient expressed concern because Dr. Katrinka BlazingSmith told her that her lab work was stable, and her PCP told her that Invokana needed to be stopped due to "drastic kidney drop." Explained to patient that her stable lab work has been historically a little elevated, but it is being monitored and will be rechecked at next visit.  Reiterated to her that Dr. Katrinka BlazingSmith was not worried about kidney function as no medications were changed.  Informed her that her PCP is being cautious with medications and stopping Invokana and staying hydrated may normalize her kidney function, but it is no where near worrisome values currently (the patient was concerned about dialysis). Patient feels much better about lab work and st she is no longer confused.  The patient st Dr. Katrinka BlazingSmith wanted to increase amlodipine but could not due to [facility]. She would like to know if he would like to increase the medication now that Invokana has been discontinued. BP 5/30: 133/46 BP 5/31: 138/61 HR 73 BP 6/1:   138/69 HR 70  To Dr. Katrinka BlazingSmith for medication recommendations.

## 2016-05-24 NOTE — Telephone Encounter (Signed)
New message   Pt c/o medication issue: 1. Name of Medication: canagliflozin (INVOKANA) 100 MG TABS tablet  4. What is your medication issue? Pt was called and [facility] Pt states that she was then called by her PCP and was informed that the results were completely different from what Dr. Lonn GeorgiaSmiths office results. So the pt is fairly confused and request a call back.  Pt states that Dr. Lonn GeorgiaSmiths office reports that her kidney function was stable and then [facility] [place] stated that her kidney function had dropped drastically and that she was to stop taking canagliflozin (INVOKANA) 100 MG TABS tablet. Pt is requesting a call back to discuss medications.

## 2016-05-25 NOTE — Telephone Encounter (Signed)
BP is stable. No change required.

## 2016-05-27 NOTE — Telephone Encounter (Signed)
Pt aware of Dr.Smith's response. BP is stable. No change required Pt voiced appreciation for the call and verbalized understanding.

## 2016-06-11 NOTE — Progress Notes (Signed)
Cardiology Office Note    Date:  06/12/2016   ID:  Hailey Levine, DOB 25-Feb-1946, MRN [id number]  PCP:  Jerral Ralph, MD  Cardiologist: Lesleigh Noe, MD   Chief Complaint  Patient presents with  . Coronary Artery Disease    History of Present Illness:  Hailey Levine is a 70 y.o. female CAD, hypertension, lower extremity swelling, and dyspnea on exertion.  Medication adjustments were made when she was last seen here in May. The diuretic therapy was intensified. Amlodipine was decreased in intensity. Edema has significantly improved. Dyspnea has also improved. She is concerned that her blood pressures running in the 130s.  Past Medical History  Diagnosis Date  . Hypertension   . Coronary artery disease   . Myocardial infarction (HCC) 2002    MI x 2 - one prior to 2002  . Diabetes mellitus without complication (HCC)   . Depression   . Arthritis     Past Surgical History  Procedure Laterality Date  . Abdominal hysterectomy    . Ovarian cyst removal    . Tubal ligation    . Coronary artery bypass graft  2002  . Coronary angioplasty with stent placement  2003  . Cholecystectomy    . Eye surgery Bilateral     cataract  . Reverse shoulder arthroplasty Left 04/05/2014    Procedure: LEFT REVERSE SHOULDER ARTHROPLASTY;  Surgeon: Senaida Lange, MD;  Location: [facility] OR;  Service: Orthopedics;  Laterality: Left;    Current Medications: Outpatient Prescriptions Prior to Visit  Medication Sig Dispense Refill  . ALPRAZolam (XANAX) 0.25 MG tablet Take 0.25 mg by mouth 3 (three) times daily as needed for anxiety.    Marland Kitchen amLODipine (NORVASC) 10 MG tablet Take 5 mg by mouth daily.    Marland Kitchen aspirin 81 MG tablet Take 81 mg by mouth daily.    Marland Kitchen atorvastatin (LIPITOR) 40 MG tablet Take 40 mg by mouth daily.    . carvedilol (COREG) 25 MG tablet Take 25 mg by mouth 2 (two) times daily with a meal.    . cycloSPORINE (RESTASIS) 0.05 % ophthalmic emulsion Place 1 drop into both  eyes 2 (two) times daily.    . furosemide (LASIX) 40 MG tablet Take 1 tablet (40 mg total) by mouth daily. 30 tablet 5  . losartan (COZAAR) 100 MG tablet Take 100 mg by mouth daily.    . metFORMIN (GLUCOPHAGE-XR) 500 MG 24 hr tablet Take 1,000 mg by mouth daily.     . nitroGLYCERIN (NITROSTAT) 0.4 MG SL tablet Place 0.4 mg under the tongue every 5 (five) minutes x 3 doses as needed. (chest pain)    . Omega-3 Fatty Acids (OMEGA 3 PO) Take 2 tablets by mouth daily.    . probenecid (BENEMID) 500 MG tablet Take 500 mg by mouth 2 (two) times daily.    . canagliflozin (INVOKANA) 100 MG TABS tablet Take 100 mg by mouth daily before breakfast. Reported on 06/12/2016     No facility-administered medications prior to visit.     Allergies:   Amlodipine besy-benazepril hcl; Metoprolol; and Percocet   Social History   Social History  . Marital Status: Widowed    Spouse Name: N/A  . Number of Children: N/A  . Years of Education: N/A   Social History Main Topics  . Smoking status: Former Smoker -- 36 years    Quit date: 12/16/1998  . Smokeless tobacco: Never Used  . Alcohol Use: No  .  Drug Use: No  . Sexual Activity: Not Asked   Other Topics Concern  . None   Social History Narrative     Family History:  The patient's family history includes Heart attack (age of onset: 7445) in her father.   ROS:   Please see the history of present illness.    All other systems reviewed and are negative.   PHYSICAL EXAM:   VS:  BP 136/82 mmHg  Pulse 60  Ht 4\' 10"  (1.473 m)  Wt 159 lb 12.8 oz (72.485 kg)  BMI 33.41 kg/m2   GEN: Well nourished, well developed, in no acute distress HEENT: normal Neck: no JVD, carotid bruits, or masses Cardiac: RRR; no murmurs, rubs, or gallops,no edema  Respiratory:  clear to auscultation bilaterally, normal work of breathing GI: soft, nontender, nondistended, + BS MS: no deformity or atrophy Skin: warm and dry, no rash Neuro:  Alert and Oriented x 3, Strength and  sensation are intact Psych: euthymic mood, full affect  Wt Readings from Last 3 Encounters:  06/12/16 159 lb 12.8 oz (72.485 kg)  04/29/16 160 lb (72.576 kg)  04/25/15 156 lb 6.4 oz (70.943 kg)      Studies/Labs Reviewed:   EKG:  EKG Is not repeated on this visit.  Recent Labs: 05/17/2016: BUN 32*; Creat 1.65*; Potassium 4.7; Sodium 140   Lipid Panel No results found for: CHOL, TRIG, HDL, CHOLHDL, VLDL, LDLCALC, LDLDIRECT  Additional studies/ records that were reviewed today include:  No new data is available. Laboratory data performed after increasing intensity of diuretic therapy was in good range.    ASSESSMENT:    1. Coronary artery disease due to lipid rich plaque   2. Essential hypertension   3. Hyperlipidemia      PLAN:  In order of problems listed above:  1. No change in current therapy for coronary disease. Report any new symptoms. 2. Blood pressure is adequately controlled. I have discharged her with obligation of checking her pressure at least twice per month and notifying us if she is consistently greater than 140/90 mmHg. Low salt diet and weight loss are recommended.   Medication Adjustments/Labs and Tests Ordered: Current medicines are reviewed at length with the patient today.  Concerns regarding medicines are outlined above.  Medication changes, Labs and Tests ordered today are listed in the Patient Instructions below. There are no Patient Instructions on file for this visit.   Signed, Lesleigh NoeHenry W Quamel Fitzmaurice III, MD  06/12/2016 12:16 PM    [facility] [address], [place], [place]  [address] Phone: [phone](336) [phone]; Fax: [phone](336) [phone]

## 2016-06-12 ENCOUNTER — Ambulatory Visit (INDEPENDENT_AMBULATORY_CARE_PROVIDER_SITE_OTHER): Payer: Medicare Other | Admitting: Interventional Cardiology

## 2016-06-12 ENCOUNTER — Encounter: Payer: Self-pay | Admitting: Interventional Cardiology

## 2016-06-12 VITALS — BP 136/82 | HR 60 | Ht <= 58 in | Wt 159.8 lb

## 2016-06-12 DIAGNOSIS — I2510 Atherosclerotic heart disease of native coronary artery without angina pectoris: Secondary | ICD-10-CM | POA: Diagnosis not present

## 2016-06-12 DIAGNOSIS — E785 Hyperlipidemia, unspecified: Secondary | ICD-10-CM

## 2016-06-12 DIAGNOSIS — I10 Essential (primary) hypertension: Secondary | ICD-10-CM | POA: Diagnosis not present

## 2016-06-12 DIAGNOSIS — I2583 Coronary atherosclerosis due to lipid rich plaque: Principal | ICD-10-CM

## 2016-06-12 MED ORDER — FUROSEMIDE 40 MG PO TABS: 40.0000 mg | ORAL_TABLET | Freq: Every day | ORAL | Status: DC

## 2016-06-12 NOTE — Patient Instructions (Signed)
Your physician wants you to follow-up in: 9 MONTHS WITH DR Marlou StarksSMITH You will receive a reminder letter in the mail two months in advance. If you don't receive a letter, please call our office to schedule the follow-up appointment.

## 2017-01-29 ENCOUNTER — Emergency Department (HOSPITAL_COMMUNITY): Payer: Medicare Other

## 2017-01-29 ENCOUNTER — Encounter (HOSPITAL_COMMUNITY): Payer: Self-pay | Admitting: Emergency Medicine

## 2017-01-29 ENCOUNTER — Observation Stay (HOSPITAL_COMMUNITY)
Admission: EM | Admit: 2017-01-29 | Discharge: 2017-01-31 | Disposition: A | Discharge status: Home / Self Care | Payer: Medicare Other | Attending: Internal Medicine | Admitting: Internal Medicine

## 2017-01-29 DIAGNOSIS — Z951 Presence of aortocoronary bypass graft: Secondary | ICD-10-CM | POA: Insufficient documentation

## 2017-01-29 DIAGNOSIS — E1169 Type 2 diabetes mellitus with other specified complication: Secondary | ICD-10-CM | POA: Diagnosis not present

## 2017-01-29 DIAGNOSIS — Z7982 Long term (current) use of aspirin: Secondary | ICD-10-CM | POA: Insufficient documentation

## 2017-01-29 DIAGNOSIS — R079 Chest pain, unspecified: Principal | ICD-10-CM | POA: Diagnosis present

## 2017-01-29 DIAGNOSIS — Z6832 Body mass index (BMI) 32.0-32.9, adult: Secondary | ICD-10-CM | POA: Insufficient documentation

## 2017-01-29 DIAGNOSIS — I10 Essential (primary) hypertension: Secondary | ICD-10-CM

## 2017-01-29 DIAGNOSIS — Z87891 Personal history of nicotine dependence: Secondary | ICD-10-CM | POA: Insufficient documentation

## 2017-01-29 DIAGNOSIS — R61 Generalized hyperhidrosis: Secondary | ICD-10-CM

## 2017-01-29 DIAGNOSIS — R7989 Other specified abnormal findings of blood chemistry: Secondary | ICD-10-CM

## 2017-01-29 DIAGNOSIS — E785 Hyperlipidemia, unspecified: Secondary | ICD-10-CM | POA: Diagnosis not present

## 2017-01-29 DIAGNOSIS — R42 Dizziness and giddiness: Secondary | ICD-10-CM | POA: Diagnosis present

## 2017-01-29 DIAGNOSIS — N183 Chronic kidney disease, stage 3 (moderate): Secondary | ICD-10-CM | POA: Insufficient documentation

## 2017-01-29 DIAGNOSIS — I252 Old myocardial infarction: Secondary | ICD-10-CM | POA: Insufficient documentation

## 2017-01-29 DIAGNOSIS — Z955 Presence of coronary angioplasty implant and graft: Secondary | ICD-10-CM | POA: Insufficient documentation

## 2017-01-29 DIAGNOSIS — Z79899 Other long term (current) drug therapy: Secondary | ICD-10-CM | POA: Diagnosis not present

## 2017-01-29 DIAGNOSIS — E1122 Type 2 diabetes mellitus with diabetic chronic kidney disease: Secondary | ICD-10-CM | POA: Diagnosis present

## 2017-01-29 DIAGNOSIS — I129 Hypertensive chronic kidney disease with stage 1 through stage 4 chronic kidney disease, or unspecified chronic kidney disease: Secondary | ICD-10-CM | POA: Insufficient documentation

## 2017-01-29 DIAGNOSIS — I2510 Atherosclerotic heart disease of native coronary artery without angina pectoris: Secondary | ICD-10-CM | POA: Diagnosis not present

## 2017-01-29 DIAGNOSIS — R778 Other specified abnormalities of plasma proteins: Secondary | ICD-10-CM

## 2017-01-29 DIAGNOSIS — Z7984 Long term (current) use of oral hypoglycemic drugs: Secondary | ICD-10-CM | POA: Diagnosis not present

## 2017-01-29 DIAGNOSIS — I25709 Atherosclerosis of coronary artery bypass graft(s), unspecified, with unspecified angina pectoris: Secondary | ICD-10-CM | POA: Diagnosis present

## 2017-01-29 DIAGNOSIS — E669 Obesity, unspecified: Secondary | ICD-10-CM | POA: Diagnosis not present

## 2017-01-29 HISTORY — DX: Chronic kidney disease, unspecified: N18.9

## 2017-01-29 HISTORY — DX: Hyperlipidemia, unspecified: E78.5

## 2017-01-29 LAB — URINALYSIS, ROUTINE W REFLEX MICROSCOPIC
Bacteria, UA: NONE SEEN
Bilirubin Urine: NEGATIVE
Glucose, UA: NEGATIVE mg/dL
Ketones, ur: NEGATIVE mg/dL
Leukocytes, UA: NEGATIVE
Nitrite: NEGATIVE
Protein, ur: 100 mg/dL — AB
Specific Gravity, Urine: 1.004 — ABNORMAL LOW (ref 1.005–1.030)
Squamous Epithelial / LPF: NONE SEEN
pH: 6 (ref 5.0–8.0)

## 2017-01-29 LAB — BASIC METABOLIC PANEL
Anion gap: 14 (ref 5–15)
BUN: 26 mg/dL — ABNORMAL HIGH (ref 6–20)
CALCIUM: 9.1 mg/dL (ref 8.9–10.3)
CO2: 23 mmol/L (ref 22–32)
CREATININE: 1.3 mg/dL — AB (ref 0.44–1.00)
Chloride: 95 mmol/L — ABNORMAL LOW (ref 101–111)
GFR, EST AFRICAN AMERICAN: 46 mL/min — AB (ref >60)
GFR, EST NON AFRICAN AMERICAN: 40 mL/min — AB (ref >60)
GLUCOSE: 81 mg/dL (ref 65–99)
Potassium: 4.1 mmol/L (ref 3.5–5.1)
Sodium: 132 mmol/L — ABNORMAL LOW (ref 135–145)

## 2017-01-29 LAB — CBC
HCT: 36.9 % (ref 36.0–46.0)
Hemoglobin: 12.3 g/dL (ref 12.0–15.0)
MCH: 30 pg (ref 26.0–34.0)
MCHC: 33.3 g/dL (ref 30.0–36.0)
MCV: 90 fL (ref 78.0–100.0)
PLATELETS: 209 10*3/uL (ref 150–400)
RBC: 4.1 MIL/uL (ref 3.87–5.11)
RDW: 14.8 % (ref 11.5–15.5)
WBC: 14.7 10*3/uL — ABNORMAL HIGH (ref 4.0–10.5)

## 2017-01-29 LAB — I-STAT TROPONIN, ED: TROPONIN I, POC: 0 ng/mL (ref 0.00–0.08)

## 2017-01-29 NOTE — H&P (Signed)
History and Physical    Hailey Levine ZOX:[id number] DOB: 26-Oct-1946 DOA: 01/29/2017  PCP: James Ivanoff, NP Consultants:  Katrinka Blazing - cardiology; Elenore Rota - nephrology (stopped seeing) Patient coming from: home - lives alone; NOK: Wendie Agreste, brother-in-law, [phone]  Chief Complaint: dizziness and diaphoresis  HPI: Hailey Levine is a 71 y.o. female with medical history significant of CAD with MI resulting in 3v CABG in 2002; HTN; HLD; DM; and CKD presenting with symptoms similar to prior index symptoms.  "I thought I was having a heart attack.  But I wasn't sure."  BP went way up, very diaphoretic, dizzy.  She had started to cook her second dinner of the night (burned the first round because she was playing on the computer).  Symptoms lasted until EMS arrived.  ASA/NTG taken before EMS arrived.  No recurrence of symptoms.  Dizziness was similar to with prior MI, has never had CP.  Prior MI was 05/21/2001.  Last had nuclear stress test after surgery, 3v CABG.  No cath since MI.     ED Course: Per PA- Law: Asian with significant cardiac history. Heart score 5. CBC shows WBC 14.7. BMP shows sodium 132, chloride 95, BUN 26, creatinine 1.30. Initial troponin negative. UA shows small hematuria, 100 protein. CXR shows Extremely shallow inspiration with linear atelectasis in the lower lungs. Nodular prominence of the right hilum may be due to artifact but hilar mass/ lymphadenopathy is not excluded. Consider repeat chest x-ray with improved inspiration versus CT for further evaluation. CT chest shows "opacity on radiograph in the right hilum was likely a vascular confluence due to low lung volumes and elevated right hemidiaphragm; no hilar mass or adenopathy identified; no acute process; severe coronary artery calcification, status post CABG; severe aortic calcific atherosclerosis." Due to patient's cardiac history and similar symptoms to past MI, myself and Dr. Rush Landmark feel that patient should be  admitted for observation to have cardiac rule out. I spoke with [facility], Dr. Ophelia Charter, who will admit the patient for observation and further evaluation and treatment.   Review of Systems: As per HPI; otherwise 10 point review of systems reviewed and negative.   Ambulatory Status:  Ambulates without assistance  Past Medical History:  Diagnosis Date  . Arthritis   . CKD (chronic kidney disease)   . Coronary artery disease   . Depression   . Diabetes mellitus without complication (HCC)   . Hyperlipidemia   . Hypertension   . Myocardial infarction 2002   MI x 2 - one prior to 2002    Past Surgical History:  Procedure Laterality Date  . ABDOMINAL HYSTERECTOMY    . CAROTID ENDARTERECTOMY Left 05/21/2001  . CHOLECYSTECTOMY    . CORONARY ANGIOPLASTY WITH STENT PLACEMENT  2003  . CORONARY ARTERY BYPASS GRAFT  2002  . EYE SURGERY Bilateral    cataract  . OVARIAN CYST REMOVAL    . REVERSE SHOULDER ARTHROPLASTY Left 04/05/2014   Procedure: LEFT REVERSE SHOULDER ARTHROPLASTY;  Surgeon: Senaida Lange, MD;  Location: [facility] OR;  Service: Orthopedics;  Laterality: Left;  . TUBAL LIGATION      Social History   Social History  . Marital status: Widowed    Spouse name: N/A  . Number of children: N/A  . Years of education: N/A   Occupational History  . retired    Social History Main Topics  . Smoking status: Former Smoker    Years: 36.00    Quit date: 12/16/1998  . Smokeless tobacco: Never  Used  . Alcohol use 0.0 oz/week     Comment: strawberry dacquiri once a year  . Drug use: No  . Sexual activity: Not on file   Other Topics Concern  . Not on file   Social History Narrative  . No narrative on file    Allergies  Allergen Reactions  . Amlodipine Besy-Benazepril Hcl Cough  . Metoprolol Nausea And Vomiting    "Throws up white foamy liquid"  . Percocet [Oxycodone-Acetaminophen] Nausea And Vomiting    Family History  Problem Relation Age of Onset  . Heart attack  Father 76    Prior to Admission medications   Medication Sig Start Date End Date Taking? Authorizing Provider  ALPRAZolam (XANAX) 0.25 MG tablet Take 0.25 mg by mouth 3 (three) times daily as needed for anxiety.   Yes Historical Provider, MD  amLODipine (NORVASC) 10 MG tablet Take 5 mg by mouth daily.   Yes Historical Provider, MD  aspirin 81 MG tablet Take 81 mg by mouth daily.   Yes Historical Provider, MD  atorvastatin (LIPITOR) 40 MG tablet Take 40 mg by mouth daily.   Yes Historical Provider, MD  carvedilol (COREG) 25 MG tablet Take 25 mg by mouth 2 (two) times daily with a meal.   Yes Historical Provider, MD  cycloSPORINE (RESTASIS) 0.05 % ophthalmic emulsion Place 1 drop into both eyes 2 (two) times daily.   Yes Historical Provider, MD  furosemide (LASIX) 40 MG tablet Take 1 tablet (40 mg total) by mouth daily. 06/12/16  Yes Lyn Records, MD  losartan (COZAAR) 100 MG tablet Take 100 mg by mouth daily.   Yes Historical Provider, MD  metFORMIN (GLUCOPHAGE-XR) 500 MG 24 hr tablet Take 1,000 mg by mouth daily.    Yes Historical Provider, MD  nitroGLYCERIN (NITROSTAT) 0.4 MG SL tablet Place 0.4 mg under the tongue every 5 (five) minutes x 3 doses as needed. (chest pain) 01/08/16  Yes Historical Provider, MD  Omega-3 Fatty Acids (OMEGA 3 PO) Take 2 tablets by mouth daily.   Yes Historical Provider, MD  probenecid (BENEMID) 500 MG tablet Take 500 mg by mouth 2 (two) times daily.   Yes Historical Provider, MD    Physical Exam: Vitals:   01/29/17 2027 01/29/17 2038 01/29/17 2100  BP:  (!) 196/153 171/88  Pulse:  71 72  Resp:  18 14  Temp:  98.3 F (36.8 C)   TempSrc:  Oral   SpO2: 97% 97% 99%  Weight:  68 kg (150 lb)   Height:  4\' 10"  (1.473 m)      General: Appears calm and comfortable and is NAD Eyes:  PERRL, EOMI, normal lids, iris ENT:  grossly normal hearing, lips & tongue, mmm Neck:  no LAD, masses or thyromegaly Cardiovascular:  RRR, no m/r/g. No LE edema.  Respiratory:   CTA bilaterally, no w/r/r. Normal respiratory effort. Abdomen:  soft, ntnd, NABS Skin:  no rash or induration seen on limited exam Musculoskeletal:  grossly normal tone BUE/BLE, good ROM, no bony abnormality Psychiatric:  grossly normal mood and affect with mild anxiety/emotional lability, speech fluent and appropriate, AOx3 Neurologic:  CN 2-12 grossly intact, moves all extremities in coordinated fashion, sensation intact  Labs on Admission: I have personally reviewed following labs and imaging studies  CBC:  Recent Labs Lab 01/29/17 2051  WBC 14.7*  HGB 12.3  HCT 36.9  MCV 90.0  PLT 209   Basic Metabolic Panel:  Recent Labs Lab 01/29/17 2051  NA 132*  K 4.1  CL 95*  CO2 23  GLUCOSE 81  BUN 26*  CREATININE 1.30*  CALCIUM 9.1   GFR: Estimated Creatinine Clearance: 31.9 mL/min (by C-G formula based on SCr of 1.3 mg/dL (H)). Liver Function Tests: No results for input(s): AST, ALT, ALKPHOS, BILITOT, PROT, ALBUMIN in the last 168 hours. No results for input(s): LIPASE, AMYLASE in the last 168 hours. No results for input(s): AMMONIA in the last 168 hours. Coagulation Profile: No results for input(s): INR, PROTIME in the last 168 hours. Cardiac Enzymes: No results for input(s): CKTOTAL, CKMB, CKMBINDEX, TROPONINI in the last 168 hours. BNP (last 3 results) No results for input(s): PROBNP in the last 8760 hours. HbA1C: No results for input(s): HGBA1C in the last 72 hours. CBG: No results for input(s): GLUCAP in the last 168 hours. Lipid Profile: No results for input(s): CHOL, HDL, LDLCALC, TRIG, CHOLHDL, LDLDIRECT in the last 72 hours. Thyroid Function Tests: No results for input(s): TSH, T4TOTAL, FREET4, T3FREE, THYROIDAB in the last 72 hours. Anemia Panel: No results for input(s): VITAMINB12, FOLATE, FERRITIN, TIBC, IRON, RETICCTPCT in the last 72 hours. Urine analysis:    Component Value Date/Time   COLORURINE COLORLESS (A) 01/29/2017 2323   APPEARANCEUR CLEAR  01/29/2017 2323   LABSPEC 1.004 (L) 01/29/2017 2323   PHURINE 6.0 01/29/2017 2323   GLUCOSEU NEGATIVE 01/29/2017 2323   HGBUR SMALL (A) 01/29/2017 2323   BILIRUBINUR NEGATIVE 01/29/2017 2323   KETONESUR NEGATIVE 01/29/2017 2323   PROTEINUR 100 (A) 01/29/2017 2323   NITRITE NEGATIVE 01/29/2017 2323   LEUKOCYTESUR NEGATIVE 01/29/2017 2323    Creatinine Clearance: Estimated Creatinine Clearance: 31.9 mL/min (by C-G formula based on SCr of 1.3 mg/dL (H)).  Sepsis Labs: @LABRCNTIP (procalcitonin:4,lacticidven:4) )No results found for this or any previous visit (from the past 240 hour(s)).   Radiological Exams on Admission: Dg Chest 2 View  Result Date: 01/29/2017 CLINICAL DATA:  Dizziness, chest pain, and diaphoresis tonight. History of diabetes and hypertension. EXAM: CHEST  2 VIEW COMPARISON:  03/30/2014 FINDINGS: Postoperative changes in the mediastinum. Previous left shoulder arthroplasty. Surgical clips in the right upper quadrant. Extremely shallow inspiration with probable linear atelectasis in the mid lungs and lung bases. No focal consolidation. No blunting of costophrenic angles. No pneumothorax. There is nodular prominence of the right hilum measuring 2.7 cm. While this may be due to vascular crowding with shallow inspiration, a hilar mass or lymphadenopathy are not excluded. Consider repeat chest x-ray with improved inspiration versus CT for further evaluation. Calcification of the aorta. Degenerative changes in the spine. IMPRESSION: Extremely shallow inspiration with linear atelectasis in the lower lungs. Nodular prominence of the right hilum may be due to artifact but hilar mass/ lymphadenopathy is not excluded. Consider repeat chest x-ray with improved inspiration versus CT for further evaluation. Electronically Signed   By: Burman Nieves M.D.   On: 01/29/2017 21:28   Ct Chest Wo Contrast  Result Date: 01/29/2017 CLINICAL DATA:  71 y/o F; abnormal chest radiograph. History of  CABG. EXAM: CT CHEST WITHOUT CONTRAST TECHNIQUE: Multidetector CT imaging of the chest was performed following the standard protocol without IV contrast. COMPARISON:  01/29/2017 chest radiograph. FINDINGS: Cardiovascular: Normal heart size. Severe coronary artery calcification. Aortic and mitral annular calcification. Normal caliber thoracic aorta with severe calcific atherosclerosis. Normal caliber main pulmonary artery. Status post CABG. Mediastinum/Nodes: No enlarged mediastinal or axillary lymph nodes. Thyroid gland, trachea, and esophagus demonstrate no significant findings. Opacity on radiograph was likely a vascular confluence due to low lung  volumes. Elevated right hemidiaphragm. Lungs/Pleura: Lungs are clear. No pleural effusion or pneumothorax. Upper Abdomen: Status post cholecystectomy.  Aortic atherosclerosis. Musculoskeletal: Moderate multilevel degenerative changes of the thoracic spine. No acute osseous abnormality is identified. Healed median sternotomy. Partially visualized left shoulder reverse arthroplasty. IMPRESSION: 1. Opacity on radiograph in the right hilum was likely a vascular confluence due to low lung volumes and elevated right hemidiaphragm. No hilar mass or adenopathy identified. 2. No acute process. 3. Severe coronary artery calcification.  Status post CABG. 4. Severe aortic calcific atherosclerosis. Electronically Signed   By: Mitzi HansenLance  Furusawa-Stratton M.D.   On: 01/29/2017 23:01    EKG: Independently reviewed.  NSR with rate 72; nonspecific ST changes with no evidence of acute ischemia  Assessment/Plan Principal Problem:   Chest pain Active Problems:   CAD (coronary artery disease)   Essential hypertension   Hyperlipidemia   Diabetes mellitus type 2 in obese (HCC)   CKD stage 3 due to type 2 diabetes mellitus (HCC)   Chest pain -Patient with acute onset of dizziness and diaphoresis with exertion which was similar to prior index symptoms in 2002 when she had 3v  CABG. -Does not appear to have had further stress testing or cath since then.  -She did not actually have chest pain on either occasion. -Symptoms appear to have been exertional and appear to have improved with rest/NTG. -2/3 typical symptoms suggestive of atypical cardiac chest pain.  -CXR unremarkable.   -Initial cardiac troponin negative. -EKG concerning for ischemia, but no STEMI. -CT chest shows severe coronary artery calcification and severe aortic calcific atherosclerosis. -I am concerned that tonight's symptoms could have been indicative of unstable angina, but her symptoms were atypical and so this is not certain. -Her symptoms have resolved and her troponin is negative. -Will plan to place in observation status on telemetry to rule out ACS by overnight observation.  -cycle troponin q6h x 3 and repeat EKG in AM -Continue ASA 81 mg daily -morphine given -statin continued -Risk factor stratification with HgbA1c and FLP; will also check TSH -Cardiology consultation by Dr. Katrinka BlazingSmith in the AM - NPO for possible stress test (message sent via inbox message to [employer]) -Will plan to start Heparin drip if her enzymes are positive and/or symptoms recur  HTN -Takes Norvasc, Coreg, and Cozaar at home -Patient with suboptimal control while in the ER -This may be related to anxiety and the current situation -Will add prn hydralazine  HLD -Continue Lipitor -Check lipids  DM -Glucose 81 -Hold Glucophage -Will cover with SSI for now  CKD Creatinine 1.30, stable/improved  DVT prophylaxis: Lovenox  Code Status: DNR - confirmed with patient/family Family Communication: Brother-in-law present throughout evaluation  Disposition Plan:  Home once clinically improved Consults called: Cardiology via inbox message to [employer]  Admission status: It is my clinical opinion that referral for OBSERVATION is reasonable and necessary in this patient based on the above information provided. The  aforementioned taken together are felt to place the patient at high risk for further clinical deterioration. However it is anticipated that the patient may be medically stable for discharge from the hospital within 24 to 48 hours.    Jonah BlueJennifer Andon Villard MD [facility]  If 7PM-7AM, please contact night-coverage [email or web address] Password [email or web address]  01/30/2017, 12:52 AM

## 2017-01-29 NOTE — ED Provider Notes (Signed)
[facility]-EMERGENCY DEPT Provider Note   CSN: [id number] Arrival date & time: 01/29/17  2026     History   Chief Complaint Chief Complaint  Patient presents with  . Dizziness    Pt states she had similar sx when she had a previous MI    HPI Hailey Levine is a 71 y.o. female with history of CAD and MI, hypertension, diabetes who presents with hypertension and an episode of lightheadedness and diaphoresis that is now resolved. Patient's episode began around 6 PM. Patient denies any chest pain or shortness of breath, then or now. Patient was given one nitroglycerin and aspirin en route. Patient is asymptomatic now. Patient states that she has taken her hypertension medications this morning, but has not taken her nightly medications. Patient denies any chest pain, shortness of breath, abdominal pain, nausea, vomiting, urinary symptoms, headache, lightheadedness, dizziness. Patient has not taken her PM medications PTA.  HPI  Past Medical History:  Diagnosis Date  . Arthritis   . CKD (chronic kidney disease)   . Coronary artery disease   . Depression   . Diabetes mellitus without complication (HCC)   . Hyperlipidemia   . Hypertension   . Myocardial infarction 2002   MI x 2 - one prior to 2002    Patient Active Problem List   Diagnosis Date Noted  . Chest pain 01/29/2017  . 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 CAD (coronary artery disease) 02/28/2014  . Essential hypertension 02/28/2014  . Hyperlipidemia 02/28/2014    Past Surgical History:  Procedure Laterality Date  . ABDOMINAL HYSTERECTOMY    . CAROTID ENDARTERECTOMY Left 05/21/2001  . CHOLECYSTECTOMY    . CORONARY ANGIOPLASTY WITH STENT PLACEMENT  2003  . CORONARY ARTERY BYPASS GRAFT  2002  . EYE SURGERY Bilateral    cataract  . OVARIAN CYST REMOVAL    . REVERSE SHOULDER ARTHROPLASTY Left 04/05/2014   Procedure: LEFT REVERSE SHOULDER ARTHROPLASTY;  Surgeon: Senaida LangeKevin M Supple, MD;  Location: [facility] OR;  Service:  Orthopedics;  Laterality: Left;  . TUBAL LIGATION      OB History    No data available       Home Medications    Prior to Admission medications   Medication Sig Start Date End Date Taking? Authorizing Provider  ALPRAZolam (XANAX) 0.25 MG tablet Take 0.25 mg by mouth 3 (three) times daily as needed for anxiety.   Yes Historical Provider, MD  amLODipine (NORVASC) 10 MG tablet Take 5 mg by mouth daily.   Yes Historical Provider, MD  aspirin 81 MG tablet Take 81 mg by mouth daily.   Yes Historical Provider, MD  atorvastatin (LIPITOR) 40 MG tablet Take 40 mg by mouth daily.   Yes Historical Provider, MD  carvedilol (COREG) 25 MG tablet Take 25 mg by mouth 2 (two) times daily with a meal.   Yes Historical Provider, MD  cycloSPORINE (RESTASIS) 0.05 % ophthalmic emulsion Place 1 drop into both eyes 2 (two) times daily.   Yes Historical Provider, MD  furosemide (LASIX) 40 MG tablet Take 1 tablet (40 mg total) by mouth daily. 06/12/16  Yes Lyn RecordsHenry W Smith, MD  losartan (COZAAR) 100 MG tablet Take 100 mg by mouth daily.   Yes Historical Provider, MD  metFORMIN (GLUCOPHAGE-XR) 500 MG 24 hr tablet Take 1,000 mg by mouth daily.    Yes Historical Provider, MD  nitroGLYCERIN (NITROSTAT) 0.4 MG SL tablet Place 0.4 mg under the tongue every 5 (five) minutes x 3 doses as needed. (chest  pain) 01/08/16  Yes Historical Provider, MD  Omega-3 Fatty Acids (OMEGA 3 PO) Take 2 tablets by mouth daily.   Yes Historical Provider, MD  probenecid (BENEMID) 500 MG tablet Take 500 mg by mouth 2 (two) times daily.   Yes Historical Provider, MD    Family History Family History  Problem Relation Age of Onset  . Heart attack Father 15    Social History Social History  Substance Use Topics  . Smoking status: Former Smoker    Years: 36.00    Quit date: 12/16/1998  . Smokeless tobacco: Never Used  . Alcohol use 0.0 oz/week     Comment: strawberry dacquiri once a year     Allergies   Amlodipine besy-benazepril hcl;  Metoprolol; and Percocet [oxycodone-acetaminophen]   Review of Systems Review of Systems  Constitutional: Positive for diaphoresis (resolved). Negative for chills and fever.  HENT: Negative for facial swelling and sore throat.   Respiratory: Negative for cough and shortness of breath.   Cardiovascular: Negative for chest pain.  Gastrointestinal: Negative for abdominal pain, nausea and vomiting.  Genitourinary: Negative for dysuria.  Musculoskeletal: Negative for back pain.  Skin: Negative for rash and wound.  Neurological: Positive for dizziness (resolved). Negative for headaches.  Psychiatric/Behavioral: The patient is not nervous/anxious.      Physical Exam Updated Vital Signs BP 171/88   Pulse 72   Temp 98.3 F (36.8 C) (Oral)   Resp 14   Ht 4\' 10"  (1.473 m)   Wt 68 kg   SpO2 99%   BMI 31.35 kg/m   Physical Exam  Constitutional: She appears well-developed and well-nourished. No distress.  HENT:  Head: Normocephalic and atraumatic.  Mouth/Throat: Oropharynx is clear and moist. No oropharyngeal exudate.  Eyes: Conjunctivae and EOM are normal. Pupils are equal, round, and reactive to light. Right eye exhibits no discharge. Left eye exhibits no discharge. No scleral icterus.  Neck: Normal range of motion. Neck supple. No thyromegaly present.  Cardiovascular: Normal rate, regular rhythm, normal heart sounds and intact distal pulses.  Exam reveals no gallop and no friction rub.   No murmur heard. Pulmonary/Chest: Effort normal and breath sounds normal. No stridor. No respiratory distress. She has no wheezes. She has no rales.  Abdominal: Soft. Bowel sounds are normal. She exhibits no distension. There is no tenderness. There is no rebound and no guarding.  Musculoskeletal: She exhibits no edema.  Lymphadenopathy:    She has no cervical adenopathy.  Neurological: She is alert. Coordination normal.  CN 3-12 intact; normal sensation throughout; 5/5 strength in all 4  extremities; equal bilateral grip strength; no ataxia on finger to nose  Skin: Skin is warm and dry. No rash noted. She is not diaphoretic. No pallor.  Psychiatric: She has a normal mood and affect.  Nursing note and vitals reviewed.    ED Treatments / Results  Labs (all labs ordered are listed, but only abnormal results are displayed) Labs Reviewed  BASIC METABOLIC PANEL - Abnormal; Notable for the following:       Result Value   Sodium 132 (*)    Chloride 95 (*)    BUN 26 (*)    Creatinine, Ser 1.30 (*)    GFR calc non Af Amer 40 (*)    GFR calc Af Amer 46 (*)    All other components within normal limits  CBC - Abnormal; Notable for the following:    WBC 14.7 (*)    All other components within normal limits  URINALYSIS, ROUTINE W REFLEX MICROSCOPIC - Abnormal; Notable for the following:    Color, Urine COLORLESS (*)    Specific Gravity, Urine 1.004 (*)    Hgb urine dipstick SMALL (*)    Protein, ur 100 (*)    All other components within normal limits  I-STAT TROPOININ, ED    EKG  EKG Interpretation None       Radiology Dg Chest 2 View  Result Date: 01/29/2017 CLINICAL DATA:  Dizziness, chest pain, and diaphoresis tonight. History of diabetes and hypertension. EXAM: CHEST  2 VIEW COMPARISON:  03/30/2014 FINDINGS: Postoperative changes in the mediastinum. Previous left shoulder arthroplasty. Surgical clips in the right upper quadrant. Extremely shallow inspiration with probable linear atelectasis in the mid lungs and lung bases. No focal consolidation. No blunting of costophrenic angles. No pneumothorax. There is nodular prominence of the right hilum measuring 2.7 cm. While this may be due to vascular crowding with shallow inspiration, a hilar mass or lymphadenopathy are not excluded. Consider repeat chest x-ray with improved inspiration versus CT for further evaluation. Calcification of the aorta. Degenerative changes in the spine. IMPRESSION: Extremely shallow inspiration  with linear atelectasis in the lower lungs. Nodular prominence of the right hilum may be due to artifact but hilar mass/ lymphadenopathy is not excluded. Consider repeat chest x-ray with improved inspiration versus CT for further evaluation. Electronically Signed   By: Burman Nieves M.D.   On: 01/29/2017 21:28   Ct Chest Wo Contrast  Result Date: 01/29/2017 CLINICAL DATA:  71 y/o F; abnormal chest radiograph. History of CABG. EXAM: CT CHEST WITHOUT CONTRAST TECHNIQUE: Multidetector CT imaging of the chest was performed following the standard protocol without IV contrast. COMPARISON:  01/29/2017 chest radiograph. FINDINGS: Cardiovascular: Normal heart size. Severe coronary artery calcification. Aortic and mitral annular calcification. Normal caliber thoracic aorta with severe calcific atherosclerosis. Normal caliber main pulmonary artery. Status post CABG. Mediastinum/Nodes: No enlarged mediastinal or axillary lymph nodes. Thyroid gland, trachea, and esophagus demonstrate no significant findings. Opacity on radiograph was likely a vascular confluence due to low lung volumes. Elevated right hemidiaphragm. Lungs/Pleura: Lungs are clear. No pleural effusion or pneumothorax. Upper Abdomen: Status post cholecystectomy.  Aortic atherosclerosis. Musculoskeletal: Moderate multilevel degenerative changes of the thoracic spine. No acute osseous abnormality is identified. Healed median sternotomy. Partially visualized left shoulder reverse arthroplasty. IMPRESSION: 1. Opacity on radiograph in the right hilum was likely a vascular confluence due to low lung volumes and elevated right hemidiaphragm. No hilar mass or adenopathy identified. 2. No acute process. 3. Severe coronary artery calcification.  Status post CABG. 4. Severe aortic calcific atherosclerosis. Electronically Signed   By: Mitzi Hansen M.D.   On: 01/29/2017 23:01    Procedures Procedures (including critical care time)  Medications Ordered in  ED Medications - No data to display   Initial Impression / Assessment and Plan / ED Course  I have reviewed the triage vital signs and the nursing notes.  Pertinent labs & imaging results that were available during my care of the patient were reviewed by me and considered in my medical decision making (see chart for details).     Asian with significant cardiac history. Heart score 5. CBC shows WBC 14.7. BMP shows sodium 132, chloride 95, BUN 26, creatinine 1.30. Initial troponin negative. UA shows small hematuria, 100 protein. CXR shows Extremely shallow inspiration with linear atelectasis in the lower lungs. Nodular prominence of the right hilum may be due to artifact but hilar mass/ lymphadenopathy is not excluded. Consider  repeat chest x-ray with improved inspiration versus CT for further evaluation. CT chest shows "opacity on radiograph in the right hilum was likely a vascular confluence due to low lung volumes and elevated right hemidiaphragm; no hilar mass or adenopathy identified; no acute process; severe coronary artery calcification, status post CABG; severe aortic calcific atherosclerosis." Due to patient's cardiac history and similar symptoms to past MI, myself and Dr. Rush Landmark feel that patient should be admitted for observation to have cardiac rule out. I spoke with [facility], Dr. Ophelia Charter, who will admit the patient for observation and further evaluation and treatment.  Final Clinical Impressions(s) / ED Diagnoses   Final diagnoses:  Dizziness  Diaphoresis    New Prescriptions New Prescriptions   No medications on file     Emi Holes, Cordelia Poche 01/30/17 0006    Canary Brim Tegeler, MD 01/31/17 [phone]

## 2017-01-29 NOTE — ED Triage Notes (Signed)
Pt arrived from home via EMS with reports of dizziness, and diaphoresis. Per Pt she had similar symptoms when she had an MI. She took 3234 ASA and 1 nitro prior to EMS arrival and denies any chest pain at this time.

## 2017-01-30 DIAGNOSIS — E669 Obesity, unspecified: Secondary | ICD-10-CM | POA: Diagnosis not present

## 2017-01-30 DIAGNOSIS — E1169 Type 2 diabetes mellitus with other specified complication: Secondary | ICD-10-CM | POA: Diagnosis not present

## 2017-01-30 DIAGNOSIS — I2583 Coronary atherosclerosis due to lipid rich plaque: Secondary | ICD-10-CM | POA: Diagnosis not present

## 2017-01-30 DIAGNOSIS — I2510 Atherosclerotic heart disease of native coronary artery without angina pectoris: Secondary | ICD-10-CM | POA: Diagnosis not present

## 2017-01-30 DIAGNOSIS — N183 Chronic kidney disease, stage 3 unspecified: Secondary | ICD-10-CM | POA: Diagnosis present

## 2017-01-30 DIAGNOSIS — R079 Chest pain, unspecified: Secondary | ICD-10-CM | POA: Diagnosis not present

## 2017-01-30 DIAGNOSIS — E1122 Type 2 diabetes mellitus with diabetic chronic kidney disease: Secondary | ICD-10-CM | POA: Diagnosis not present

## 2017-01-30 LAB — LIPID PANEL
CHOLESTEROL: 167 mg/dL (ref 0–200)
HDL: 42 mg/dL (ref >40)
LDL CALC: 108 mg/dL — AB (ref 0–99)
Total CHOL/HDL Ratio: 4 RATIO
Triglycerides: 85 mg/dL (ref <150)
VLDL: 17 mg/dL (ref 0–40)

## 2017-01-30 LAB — TROPONIN I
TROPONIN I: 0.1 ng/mL — AB (ref <0.03)
Troponin I: 0.09 ng/mL (ref <0.03)

## 2017-01-30 LAB — GLUCOSE, CAPILLARY
GLUCOSE-CAPILLARY: 191 mg/dL — AB (ref 65–99)
Glucose-Capillary: 133 mg/dL — ABNORMAL HIGH (ref 65–99)

## 2017-01-30 LAB — TSH: TSH: 2.955 u[IU]/mL (ref 0.350–4.500)

## 2017-01-30 LAB — CBG MONITORING, ED: Glucose-Capillary: 129 mg/dL — ABNORMAL HIGH (ref 65–99)

## 2017-01-30 MED ORDER — ONDANSETRON HCL 4 MG/2ML IJ SOLN: 4.0000 mg | Freq: Four times a day (QID) | INTRAMUSCULAR | Status: DC | PRN

## 2017-01-30 MED ORDER — CARVEDILOL 25 MG PO TABS
25.0000 mg | ORAL_TABLET | Freq: Two times a day (BID) | ORAL | Status: DC
  Administered 2017-01-30 – 2017-01-31 (×3): 25 mg via ORAL
  Filled 2017-01-30 (×2): qty 1

## 2017-01-30 MED ORDER — HYDRALAZINE HCL 20 MG/ML IJ SOLN
10.0000 mg | INTRAMUSCULAR | Status: DC | PRN
  Administered 2017-01-31: 10 mg via INTRAVENOUS

## 2017-01-30 MED ORDER — MORPHINE SULFATE (PF) 4 MG/ML IV SOLN: 2.0000 mg | INTRAVENOUS | Status: DC | PRN

## 2017-01-30 MED ORDER — AMLODIPINE BESYLATE 5 MG PO TABS
5.0000 mg | ORAL_TABLET | Freq: Every day | ORAL | Status: DC
  Administered 2017-01-30: 5 mg via ORAL
  Filled 2017-01-30: qty 1

## 2017-01-30 MED ORDER — FUROSEMIDE 10 MG/ML IJ SOLN
40.0000 mg | Freq: Once | INTRAMUSCULAR | Status: AC
  Administered 2017-01-30: 40 mg via INTRAVENOUS
  Filled 2017-01-30: qty 4

## 2017-01-30 MED ORDER — ASPIRIN 81 MG PO CHEW
81.0000 mg | CHEWABLE_TABLET | Freq: Every day | ORAL | Status: DC
  Administered 2017-01-30 – 2017-01-31 (×2): 81 mg via ORAL
  Filled 2017-01-30 (×2): qty 1

## 2017-01-30 MED ORDER — INSULIN ASPART 100 UNIT/ML ~~LOC~~ SOLN
0.0000 [IU] | Freq: Three times a day (TID) | SUBCUTANEOUS | Status: DC
  Administered 2017-01-30: 3 [IU] via SUBCUTANEOUS
  Administered 2017-01-30: 2 [IU] via SUBCUTANEOUS
  Administered 2017-01-31: 3 [IU] via SUBCUTANEOUS
  Filled 2017-01-30: qty 1

## 2017-01-30 MED ORDER — ENOXAPARIN SODIUM 40 MG/0.4ML ~~LOC~~ SOLN
40.0000 mg | SUBCUTANEOUS | Status: DC
  Administered 2017-01-30 – 2017-01-31 (×2): 40 mg via SUBCUTANEOUS
  Filled 2017-01-30 (×3): qty 0.4

## 2017-01-30 MED ORDER — AMLODIPINE BESYLATE 10 MG PO TABS
10.0000 mg | ORAL_TABLET | Freq: Every day | ORAL | Status: DC
  Administered 2017-01-31: 10 mg via ORAL
  Filled 2017-01-30: qty 1

## 2017-01-30 MED ORDER — ALPRAZOLAM 0.25 MG PO TABS: 0.2500 mg | ORAL_TABLET | Freq: Three times a day (TID) | ORAL | Status: DC | PRN

## 2017-01-30 MED ORDER — CYCLOSPORINE 0.05 % OP EMUL
1.0000 [drp] | Freq: Two times a day (BID) | OPHTHALMIC | Status: DC
  Administered 2017-01-30 – 2017-01-31 (×3): 1 [drp] via OPHTHALMIC
  Filled 2017-01-30 (×5): qty 1

## 2017-01-30 MED ORDER — LOSARTAN POTASSIUM 50 MG PO TABS
100.0000 mg | ORAL_TABLET | Freq: Every day | ORAL | Status: DC
  Administered 2017-01-30 – 2017-01-31 (×2): 100 mg via ORAL
  Filled 2017-01-30 (×3): qty 2

## 2017-01-30 MED ORDER — ACETAMINOPHEN 325 MG PO TABS: 650.0000 mg | ORAL_TABLET | ORAL | Status: DC | PRN

## 2017-01-30 MED ORDER — FUROSEMIDE 40 MG PO TABS
40.0000 mg | ORAL_TABLET | Freq: Every day | ORAL | Status: DC
  Administered 2017-01-30: 40 mg via ORAL
  Filled 2017-01-30: qty 2

## 2017-01-30 MED ORDER — ATORVASTATIN CALCIUM 40 MG PO TABS
40.0000 mg | ORAL_TABLET | Freq: Every day | ORAL | Status: DC
  Administered 2017-01-30: 40 mg via ORAL
  Filled 2017-01-30: qty 1

## 2017-01-30 MED ORDER — HYDRALAZINE HCL 20 MG/ML IJ SOLN
5.0000 mg | INTRAMUSCULAR | Status: DC | PRN
  Administered 2017-01-30 (×2): 5 mg via INTRAVENOUS
  Filled 2017-01-30 (×2): qty 1

## 2017-01-30 MED ORDER — GI COCKTAIL ~~LOC~~: 30.0000 mL | Freq: Four times a day (QID) | ORAL | Status: DC | PRN

## 2017-01-30 NOTE — Progress Notes (Signed)
PROGRESS NOTE    Hailey Levine  ZOX:[id number] DOB: 08-03-1946 DOA: 01/29/2017 PCP: Jerral Ralph, MD  Brief Narrative: Hailey Levine is a 71 y.o. female with medical history significant of CAD with MI resulting in 3v CABG in 2002; HTN; HLD; DM; and CKD presenting with symptoms similar to prior index symptoms.  "I thought I was having a heart attack.  But I wasn't sure."  BP went way up, very diaphoretic, dizzy.  She had started to cook her second dinner of the night (burned the first round because she was playing on the computer).  Symptoms lasted until EMS arrived.  ASA/NTG taken before EMS arrived.  No recurrence of symptoms.  Dizziness was similar to with prior MI, has never had CP.  Prior MI was 05/21/2001.  Last had nuclear stress test after surgery, 3v CABG  Assessment & Plan:   Dizziness /Diaphoresis  -with some EKG changes, with flat t waves in lat leads compared to prior -troponin negative -Cards consulted, h/o CABG in 2002, no cardiac workup since -Hypertensive urgency/acute diastolic CHF is also a possibility, she has edema and uncontrolled HTN, will give IV lasix x1 -Continue ASA 81, coreg, statin  HTN -uncontrolled with edema, lasix IV x1 -continue Norvasc increase dose, Coreg, and Cozaar at home -Will add prn hydralazine  HLD -Continue Lipitor  DM -Hold Glucophage, SSI  CKD Creatinine 1.30, stable/improved  DVT prophylaxis: Lovenox  Code Status: Full code, refuses to be DNR now, after admission Family Communication: none at bedside Disposition Plan:  Home pending cards workup  Consultants:   Cards   Subjective: Feels ok now, acute dizziness and diaphoresis last pm  Objective: Vitals:   01/30/17 0815 01/30/17 0900 01/30/17 0957 01/30/17 1031  BP: (!) 212/99 (!) 206/72 (!) 168/112 (!) 202/71  Pulse: 79 74 72 80  Resp: 21 18 15 18   Temp:    98.4 F (36.9 C)  TempSrc:    Oral  SpO2: 97% 96% 97% 99%  Weight:    71 kg (156 lb 9.6 oz)    Height:    4\' 10"  (1.473 m)   No intake or output data in the 24 hours ending 01/30/17 1212 Filed Weights   01/29/17 2038 01/30/17 1031  Weight: 68 kg (150 lb) 71 kg (156 lb 9.6 oz)    Examination:  General exam: AAOx3, no distress Respiratory system: Clear to auscultation. Respiratory effort normal. Cardiovascular system: S1 & S2 heard, RRR. No JVD, murmurs Gastrointestinal system: Abdomen is nondistended, soft and nontender. Normal bowel sounds heard. Central nervous system: Alert and oriented. No focal neurological deficits. Extremities: 1plus edema Skin: No rashes, lesions or ulcers Psychiatry: Judgement and insight appear normal. Mood & affect appropriate.     Data Reviewed: I have personally reviewed following labs and imaging studies  CBC:  Recent Labs Lab 01/29/17 2051  WBC 14.7*  HGB 12.3  HCT 36.9  MCV 90.0  PLT 209   Basic Metabolic Panel:  Recent Labs Lab 01/29/17 2051  NA 132*  K 4.1  CL 95*  CO2 23  GLUCOSE 81  BUN 26*  CREATININE 1.30*  CALCIUM 9.1   GFR: Estimated Creatinine Clearance: 32.7 mL/min (by C-G formula based on SCr of 1.3 mg/dL (H)). Liver Function Tests: No results for input(s): AST, ALT, ALKPHOS, BILITOT, PROT, ALBUMIN in the last 168 hours. No results for input(s): LIPASE, AMYLASE in the last 168 hours. No results for input(s): AMMONIA in the last 168 hours. Coagulation Profile: No results  for input(s): INR, PROTIME in the last 168 hours. Cardiac Enzymes:  Recent Labs Lab 01/30/17 0116 01/30/17 0644  TROPONINI <0.03 <0.03   BNP (last 3 results) No results for input(s): PROBNP in the last 8760 hours. HbA1C: No results for input(s): HGBA1C in the last 72 hours. CBG:  Recent Labs Lab 01/30/17 0847  GLUCAP 129*   Lipid Profile:  Recent Labs  01/30/17 0116  CHOL 167  HDL 42  LDLCALC 108*  TRIG 85  CHOLHDL 4.0   Thyroid Function Tests:  Recent Labs  01/30/17 0116  TSH 2.955   Anemia Panel: No  results for input(s): VITAMINB12, FOLATE, FERRITIN, TIBC, IRON, RETICCTPCT in the last 72 hours. Urine analysis:    Component Value Date/Time   COLORURINE COLORLESS (A) 01/29/2017 2323   APPEARANCEUR CLEAR 01/29/2017 2323   LABSPEC 1.004 (L) 01/29/2017 2323   PHURINE 6.0 01/29/2017 2323   GLUCOSEU NEGATIVE 01/29/2017 2323   HGBUR SMALL (A) 01/29/2017 2323   BILIRUBINUR NEGATIVE 01/29/2017 2323   KETONESUR NEGATIVE 01/29/2017 2323   PROTEINUR 100 (A) 01/29/2017 2323   NITRITE NEGATIVE 01/29/2017 2323   LEUKOCYTESUR NEGATIVE 01/29/2017 2323   Sepsis Labs: @LABRCNTIP (procalcitonin:4,lacticidven:4)  )No results found for this or any previous visit (from the past 240 hour(s)).       Radiology Studies: Dg Chest 2 View  Result Date: 01/29/2017 CLINICAL DATA:  Dizziness, chest pain, and diaphoresis tonight. History of diabetes and hypertension. EXAM: CHEST  2 VIEW COMPARISON:  03/30/2014 FINDINGS: Postoperative changes in the mediastinum. Previous left shoulder arthroplasty. Surgical clips in the right upper quadrant. Extremely shallow inspiration with probable linear atelectasis in the mid lungs and lung bases. No focal consolidation. No blunting of costophrenic angles. No pneumothorax. There is nodular prominence of the right hilum measuring 2.7 cm. While this may be due to vascular crowding with shallow inspiration, a hilar mass or lymphadenopathy are not excluded. Consider repeat chest x-ray with improved inspiration versus CT for further evaluation. Calcification of the aorta. Degenerative changes in the spine. IMPRESSION: Extremely shallow inspiration with linear atelectasis in the lower lungs. Nodular prominence of the right hilum may be due to artifact but hilar mass/ lymphadenopathy is not excluded. Consider repeat chest x-ray with improved inspiration versus CT for further evaluation. Electronically Signed   By: Burman Nieves M.D.   On: 01/29/2017 21:28   Ct Chest Wo  Contrast  Result Date: 01/29/2017 CLINICAL DATA:  71 y/o F; abnormal chest radiograph. History of CABG. EXAM: CT CHEST WITHOUT CONTRAST TECHNIQUE: Multidetector CT imaging of the chest was performed following the standard protocol without IV contrast. COMPARISON:  01/29/2017 chest radiograph. FINDINGS: Cardiovascular: Normal heart size. Severe coronary artery calcification. Aortic and mitral annular calcification. Normal caliber thoracic aorta with severe calcific atherosclerosis. Normal caliber main pulmonary artery. Status post CABG. Mediastinum/Nodes: No enlarged mediastinal or axillary lymph nodes. Thyroid gland, trachea, and esophagus demonstrate no significant findings. Opacity on radiograph was likely a vascular confluence due to low lung volumes. Elevated right hemidiaphragm. Lungs/Pleura: Lungs are clear. No pleural effusion or pneumothorax. Upper Abdomen: Status post cholecystectomy.  Aortic atherosclerosis. Musculoskeletal: Moderate multilevel degenerative changes of the thoracic spine. No acute osseous abnormality is identified. Healed median sternotomy. Partially visualized left shoulder reverse arthroplasty. IMPRESSION: 1. Opacity on radiograph in the right hilum was likely a vascular confluence due to low lung volumes and elevated right hemidiaphragm. No hilar mass or adenopathy identified. 2. No acute process. 3. Severe coronary artery calcification.  Status post CABG. 4. Severe aortic  calcific atherosclerosis. Electronically Signed   By: Mitzi HansenLance  Furusawa-Stratton M.D.   On: 01/29/2017 23:01        Scheduled Meds: . amLODipine  5 mg Oral Daily  . aspirin  81 mg Oral Daily  . atorvastatin  40 mg Oral q1800  . carvedilol  25 mg Oral BID WC  . cycloSPORINE  1 drop Both Eyes BID  . enoxaparin (LOVENOX) injection  40 mg Subcutaneous Q24H  . furosemide  40 mg Oral Daily  . insulin aspart  0-15 Units Subcutaneous TID WC  . losartan  100 mg Oral Daily   Continuous Infusions:   LOS: 0 days     Time spent: 35min    Zannie CovePreetha Haddon Fyfe, MD [facility] Pager [phone]  If 7PM-7AM, please contact night-coverage [email or web address] Password [email or web address] 01/30/2017, 12:12 PM

## 2017-01-30 NOTE — Progress Notes (Signed)
Lab called this RN for critical troponin of 0.09. MD notified. Per MD, awaiting cardiology input.  Verbal order given to allow patient to eat. Will place order.

## 2017-01-30 NOTE — Progress Notes (Signed)
Patient admitted to 3E from the ED. Patient is alert and oriented. Telemetry placed. Vitals and weight obtained. BP still elevated (202/71). Morning medications given. Hydralazine IV given in ED at 0951.   There is an order for DNR, but patient states she wants CPR, intubation, medications, and defibrillation. MD notified for order change.

## 2017-01-30 NOTE — Consult Note (Signed)
Cardiology Consult    Patient ID: Hailey Levine MRN: [id number], DOB/AGE: 1946-05-22   Admit date: 01/29/2017 Date of Consult: 01/30/2017  Primary Physician: Jerral Ralph, MD Primary Cardiologist: Dr. Katrinka Blazing Requesting Provider: Dr. Ophelia Charter  Patient Profile    71 y/o woman with a history of CAD s/p MI and 3v CABG in 2002 with subsequent stenting 2003, HTN, HLD, DM, and CKD stage 3 who developed dizziness and diaphoresis that felt similar to her past MI and so came to the ED for evaluation.  Past Medical History   Past Medical History:  Diagnosis Date  . Arthritis   . CKD (chronic kidney disease)   . Coronary artery disease   . Depression   . Diabetes mellitus without complication (HCC)   . Hyperlipidemia   . Hypertension   . Myocardial infarction 2002   MI x 2 - one prior to 2002    Past Surgical History:  Procedure Laterality Date  . ABDOMINAL HYSTERECTOMY    . CAROTID ENDARTERECTOMY Left 05/21/2001  . CHOLECYSTECTOMY    . CORONARY ANGIOPLASTY WITH STENT PLACEMENT  2003  . CORONARY ARTERY BYPASS GRAFT  2002  . EYE SURGERY Bilateral    cataract  . OVARIAN CYST REMOVAL    . REVERSE SHOULDER ARTHROPLASTY Left 04/05/2014   Procedure: LEFT REVERSE SHOULDER ARTHROPLASTY;  Surgeon: Senaida Lange, MD;  Location: [facility] OR;  Service: Orthopedics;  Laterality: Left;  . TUBAL LIGATION       Allergies  Allergies  Allergen Reactions  . Metoprolol Nausea And Vomiting    "Throws up white foamy liquid"  . Percocet [Oxycodone-Acetaminophen] Nausea And Vomiting  . Benazepril Cough    History of Present Illness    Hailey Levine is a71 y/o woman with a history of CAD s/p MI and 3v CABG in 2002 by Dr. Donata Clay with subsequent stenting 2003, HTN, HLD, DM, and CKD stage 3. She was cooking dinner at home around [facility] Wednesday night when she suddenly felt lightheaded. She tried to eat thinking this might be hypoglycemia but became diaphoretic. She called EMS for fear this was  another heart attack due to similarities with her MI in 2002. She did not have chest pain at any time but does not recall any at her previous MI either. She took aspirin and a sublingual nitroglycerin but symptoms persisted until EMS had arrived. After arrival in the ED she was asymptomatic. She was noted to be quite hypertensive >200 SBP which is not usually not severely uncontrolled. CXR was poor due to inadequate inspiration with equivocal findings so chest CT obtained with no acute problems identified. Troponin initially negative with increase to 0.09 on 3rd value. EKG was in a sinus rhythm with possible T wave flattening compared to previous studies and no ST segment elevation or depression.  She has not had any significant cardiac workup since 2003 and has felt well. She does have edema on lasix that is mildly worse than usual at this time.  Inpatient Medications    . [START ON 01/31/2017] amLODipine  10 mg Oral Daily  . aspirin  81 mg Oral Daily  . atorvastatin  40 mg Oral q1800  . carvedilol  25 mg Oral BID WC  . cycloSPORINE  1 drop Both Eyes BID  . enoxaparin (LOVENOX) injection  40 mg Subcutaneous Q24H  . insulin aspart  0-15 Units Subcutaneous TID WC  . losartan  100 mg Oral Daily     Outpatient Medications  Prior to Admission medications   Medication Sig Start Date End Date Taking? Authorizing Provider  ALPRAZolam (XANAX) 0.25 MG tablet Take 0.25 mg by mouth 3 (three) times daily as needed for anxiety.   Yes Historical Provider, MD  amLODipine (NORVASC) 10 MG tablet Take 5 mg by mouth daily.   Yes Historical Provider, MD  aspirin 81 MG tablet Take 81 mg by mouth daily.   Yes Historical Provider, MD  atorvastatin (LIPITOR) 40 MG tablet Take 40 mg by mouth daily.   Yes Historical Provider, MD  carvedilol (COREG) 25 MG tablet Take 25 mg by mouth 2 (two) times daily with a meal.   Yes Historical Provider, MD  cycloSPORINE (RESTASIS) 0.05 % ophthalmic emulsion Place 1 drop into both  eyes 2 (two) times daily.   Yes Historical Provider, MD  furosemide (LASIX) 40 MG tablet Take 1 tablet (40 mg total) by mouth daily. 06/12/16  Yes Lyn Records, MD  losartan (COZAAR) 100 MG tablet Take 100 mg by mouth daily.   Yes Historical Provider, MD  metFORMIN (GLUCOPHAGE-XR) 500 MG 24 hr tablet Take 1,000 mg by mouth daily.    Yes Historical Provider, MD  nitroGLYCERIN (NITROSTAT) 0.4 MG SL tablet Place 0.4 mg under the tongue every 5 (five) minutes x 3 doses as needed. (chest pain) 01/08/16  Yes Historical Provider, MD  Omega-3 Fatty Acids (OMEGA 3 PO) Take 2 tablets by mouth daily.   Yes Historical Provider, MD  probenecid (BENEMID) 500 MG tablet Take 500 mg by mouth 2 (two) times daily.   Yes Historical Provider, MD     Family History    Family History  Problem Relation Age of Onset  . Heart attack Father 64    Social History    Social History   Social History  . Marital status: Widowed    Spouse name: N/A  . Number of children: N/A  . Years of education: N/A   Occupational History  . retired    Social History Main Topics  . Smoking status: Former Smoker    Years: 36.00    Quit date: 12/16/1998  . Smokeless tobacco: Never Used  . Alcohol use 0.0 oz/week     Comment: strawberry dacquiri once a year  . Drug use: No  . Sexual activity: Not on file   Other Topics Concern  . Not on file   Social History Narrative  . No narrative on file     Review of Systems    General:  No chills, fever, night sweats or weight changes.  Cardiovascular:  No chest pain, dyspnea on exertion, edema, orthopnea, palpitations, paroxysmal nocturnal dyspnea. Dermatological: No rash, lesions/masses Respiratory: No cough, dyspnea Urologic: No hematuria, dysuria Abdominal:   No nausea, vomiting, diarrhea, bright red blood per rectum, melena, or hematemesis Neurologic:  No visual changes, wkns, changes in mental status. All other systems reviewed and are otherwise negative except as  noted above.  Physical Exam    Blood pressure (!) 175/57, pulse 75, temperature 98.4 F (36.9 C), temperature source Oral, resp. rate 18, height 4\' 10"  (1.473 m), weight 156 lb 9.6 oz (71 kg), SpO2 99 %.  General: Pleasant, NAD Psych: Normal affect. Neuro: Alert and oriented X 3. Moves all extremities spontaneously. HEENT: Normal  Neck: Supple without bruits or JVD. Lungs:  Resp regular and unlabored, CTA. Heart: RRR no s3, s4, or murmurs. Abdomen: Soft, non-tender, non-distended, BS + x 4.  Extremities: No clubbing, cyanosis or edema. DP/PT/Radials 2+ and equal  bilaterally.  Labs    Troponin [facility](Point of Care Test)  Recent Labs  01/29/17 2117  TROPIPOC 0.00    Recent Labs  01/30/17 0116 01/30/17 0644 01/30/17 1300  TROPONINI <0.03 <0.03 0.09*   Lab Results  Component Value Date   WBC 14.7 (H) 01/29/2017   HGB 12.3 01/29/2017   HCT 36.9 01/29/2017   MCV 90.0 01/29/2017   PLT 209 01/29/2017     Recent Labs Lab 01/29/17 2051  NA 132*  K 4.1  CL 95*  CO2 23  BUN 26*  CREATININE 1.30*  CALCIUM 9.1  GLUCOSE 81   Lab Results  Component Value Date   CHOL 167 01/30/2017   HDL 42 01/30/2017   LDLCALC 108 (H) 01/30/2017   TRIG 85 01/30/2017   No results found for: [facility]   Radiology Studies    Dg Chest 2 View  Result Date: 01/29/2017 CLINICAL DATA:  Dizziness, chest pain, and diaphoresis tonight. History of diabetes and hypertension.  EXAM: CHEST  2 VIEW COMPARISON:  03/30/2014  IMPRESSION: Extremely shallow inspiration with linear atelectasis in the lower lungs. Nodular prominence of the right hilum may be due to artifact but hilar mass/ lymphadenopathy is not excluded. Consider repeat chest x-ray with improved inspiration versus CT for further evaluation.  Ct Chest Wo Contrast  Result Date: 01/29/2017 CLINICAL DATA:  71 y/o F; abnormal chest radiograph. History of CABG.  EXAM: CT CHEST WITHOUT CONTRAST TECHNIQUE: Multidetector CT imaging of the chest was  performed following the standard protocol without IV contrast. COMPARISON:  01/29/2017 chest radiograph.  IMPRESSION: 1. Opacity on radiograph in the right hilum was likely a vascular confluence due to low lung volumes and elevated right hemidiaphragm. No hilar mass or adenopathy identified. 2. No acute process. 3. Severe coronary artery calcification.  Status post CABG. 4. Severe aortic calcific atherosclerosis. Electronically Signed   By: Mitzi HansenLance  Furusawa-Stratton M.D.   On: 01/29/2017 23:01    ECG & Cardiac Imaging    2/8 @ 1407 ECG: Sinus rhythm, possible T wave flattening versus low lead voltage   Assessment & Plan    1. Chest pain: Her symptoms are atypical although similar to past MI event. Questionable electrographic changes and a very slight enzyme elevation. She certainly is at risk of new vessel blockage 16 years out from her CABG. She has had no recent stress testing or cardiac imaging. Troponin is very mildly elevated at 0.09 in a patient with CKD3. -Will continue trending troponin with increase this afternoon -ASA 81mg  -With borderline troponin and EKG we will plan for NM stress study in AM unless worsening enzyme/EKG changes/symptoms  2. Hypertension: Quite elevated with SBP ranging 141-212 over past 24hrs. She is possibly experiencing edema and increased strain on heart from this high systemic resistance. -Continue lasix at least 40mg  daily PTA dose after getting IV today -Coreg 25mg  BID -Amlodipine 10mg  daily -losartan 100mg  daily -Hydralazine PRN fine for supplementing  3. HLD: On lipitor 40mg  already  4. CKD stage 3: She appears to be near baseline renal function SCr 1.30. Has 1+ edema not obviously volume overloaded  5. T2DM: Looks fairly well controlled. Management per primary team    Fuller Planhristopher W Rice, MD PGY-II Internal Medicine Resident Pager# [phone] 01/30/2017, 4:12 PM  Attending Note:   The patient was seen and examined.  Agree with assessment and  plan as noted above.  Changes made to the above note as needed.  Patient seen and independently examined with Thayer Ohmhris Rice,PGY-2.  We discussed all aspects of the encounter. I agree with the assessment and plan as stated above.  1. CAD :  Symptoms are very similar to her presenting symptoms years ago prior to her bypass. She did not have any pain then either.  Will get a [employer] . Continue current meds    I have spent a total of 40 minutes with patient reviewing hospital  notes , telemetry, EKGs, labs and examining patient as well as establishing an assessment and plan that was discussed with the patient. > 50% of time was spent in direct patient care.    Vesta Mixer, Montez Hageman., MD, [facility] 01/30/2017, 4:47 PM [address]  [address] Office - [phone] Pager [phone]     .

## 2017-01-30 NOTE — ED Notes (Signed)
Paged admitting about NPO

## 2017-01-31 ENCOUNTER — Observation Stay (HOSPITAL_BASED_OUTPATIENT_CLINIC_OR_DEPARTMENT_OTHER): Payer: Medicare Other

## 2017-01-31 DIAGNOSIS — R079 Chest pain, unspecified: Secondary | ICD-10-CM | POA: Diagnosis not present

## 2017-01-31 DIAGNOSIS — N183 Chronic kidney disease, stage 3 (moderate): Secondary | ICD-10-CM | POA: Diagnosis not present

## 2017-01-31 DIAGNOSIS — E1122 Type 2 diabetes mellitus with diabetic chronic kidney disease: Secondary | ICD-10-CM

## 2017-01-31 DIAGNOSIS — E1169 Type 2 diabetes mellitus with other specified complication: Secondary | ICD-10-CM | POA: Diagnosis not present

## 2017-01-31 DIAGNOSIS — R748 Abnormal levels of other serum enzymes: Secondary | ICD-10-CM

## 2017-01-31 DIAGNOSIS — I2510 Atherosclerotic heart disease of native coronary artery without angina pectoris: Secondary | ICD-10-CM | POA: Diagnosis not present

## 2017-01-31 LAB — NM MYOCAR MULTI W/SPECT W/WALL MOTION / EF
CHL CUP MPHR: 148 {beats}/min
CHL CUP NUCLEAR SDS: 4
CSEPED: 5 min
Estimated workload: 1 METS
LVDIAVOL: 45 mL (ref 46–106)
LVSYSVOL: 9 mL
Peak HR: 102 {beats}/min
Percent HR: 68 %
RATE: 0
Rest HR: 68 {beats}/min
SRS: 18
SSS: 22
TID: 0.93

## 2017-01-31 LAB — GLUCOSE, CAPILLARY
Glucose-Capillary: 151 mg/dL — ABNORMAL HIGH (ref 65–99)
Glucose-Capillary: 167 mg/dL — ABNORMAL HIGH (ref 65–99)

## 2017-01-31 LAB — HEMOGLOBIN A1C
HEMOGLOBIN A1C: 6.1 % — AB (ref 4.8–5.6)
MEAN PLASMA GLUCOSE: 128 mg/dL

## 2017-01-31 LAB — TROPONIN I
TROPONIN I: 0.08 ng/mL — AB (ref <0.03)
TROPONIN I: 0.06 ng/mL — AB (ref <0.03)

## 2017-01-31 MED ORDER — TECHNETIUM TC 99M TETROFOSMIN IV KIT
30.0000 | PACK | Freq: Once | INTRAVENOUS | Status: AC | PRN
  Administered 2017-01-31: 30 via INTRAVENOUS

## 2017-01-31 MED ORDER — TECHNETIUM TC 99M TETROFOSMIN IV KIT
10.0000 | PACK | Freq: Once | INTRAVENOUS | Status: AC | PRN
  Administered 2017-01-31: 10 via INTRAVENOUS

## 2017-01-31 MED ORDER — REGADENOSON 0.4 MG/5ML IV SOLN
INTRAVENOUS | Status: AC
  Administered 2017-01-31: 0.4 mg via INTRAVENOUS
  Filled 2017-01-31: qty 5

## 2017-01-31 MED ORDER — AMLODIPINE BESYLATE 10 MG PO TABS: 10.0000 mg | ORAL_TABLET | Freq: Every day | ORAL | 0 refills | Status: DC

## 2017-01-31 MED ORDER — REGADENOSON 0.4 MG/5ML IV SOLN
0.4000 mg | Freq: Once | INTRAVENOUS | Status: AC
  Administered 2017-01-31: 0.4 mg via INTRAVENOUS

## 2017-01-31 MED ORDER — TECHNETIUM TC 99M TETROFOSMIN IV KIT: 10.0000 | PACK | Freq: Once | INTRAVENOUS | Status: DC | PRN

## 2017-01-31 MED ORDER — HYDRALAZINE HCL 20 MG/ML IJ SOLN
INTRAMUSCULAR | Status: AC
  Filled 2017-01-31: qty 1

## 2017-01-31 NOTE — Progress Notes (Signed)
Patient current EKG results: ST elevation consider inferior injury or acute infarct. Acute MI.Patient asymptomatic.  MD notified.

## 2017-01-31 NOTE — Discharge Summary (Signed)
Physician Discharge Summary  Hailey Levine:[id number] DOB: December 16, 1946 DOA: 01/29/2017  PCP: Jerral Ralph, MD  Admit date: 01/29/2017 Discharge date: 01/31/2017  Time spent: 35 minutes  Recommendations for Outpatient Follow-up:  1. PCP in 1 week   Discharge Diagnoses:  Principal Problem:   Chest pain Active Problems:   CAD (coronary artery disease)   Essential hypertension   Hyperlipidemia   Diabetes mellitus type 2 in obese [facility])   CKD stage 3 due to type 2 diabetes mellitus [facility])   Discharge Condition:stable  Diet recommendation: Heart healthy  Filed Weights   01/29/17 2038 01/30/17 1031 01/31/17 0524  Weight: 68 kg (150 lb) 71 kg (156 lb 9.6 oz) 70 kg (154 lb 4.8 oz)    History of present illness:  71 y/o woman with a history of CAD s/p MI and 3v CABG in 2002 with subsequent stenting 2003, HTN, HLD, DM, and CKD stage 3 who developed dizziness and diaphoresis that felt similar to her past MI and so came to the ED for evaluation  Hospital Course:  1. Chest pain: Her symptoms are atypical although similar to past MI event. Questionable electrographic changes and a very slight enzyme elevation. She certainly is at risk of new vessel blockage 16 years out from her CABG.  - Troponin is very mildly elevated at 0.09 in a patient with CKD3. - continue ASA 81mg , seen by Cardiology, underwent Myoview which was low risk, discussed with cardiology felt to be stable for discharge home and FU with Dr.Smith  2. Hypertension: Quite elevated with SBP ranging 141-212 over past 24hrs. She is possibly experiencing edema and increased strain on heart from this high systemic resistance. -Continue lasix at least 40mg  daily , Coreg 25mg  BID -Amlodipine increased to 10mg  -losartan 100mg  daily -quick FU with PCP, consider increasing lasix or losartan at FU if BP remains high  3. HLD: On lipitor 40mg  already  4. CKD stage 3: She appears to be near baseline renal function SCr 1.30.  Has 1+ edema not obviously volume overloaded -continued on PO lasix 40mg  daily  5. T2DM:  Procedures:  Myoview: low risk  Consultations:  Cardiology  Discharge Exam: Vitals:   01/31/17 1023 01/31/17 1249  BP: (!) 119/31 (!) 177/46  Pulse:  76  Resp: 20 18  Temp:  98.4 F (36.9 C)    General: AAOx3 Cardiovascular: S1S2/RRR Respiratory: CTAB  Discharge Instructions   Discharge Instructions    Diet - low sodium heart healthy    Complete by:  As directed    Diet - low sodium heart healthy    Complete by:  As directed    Diet Carb Modified    Complete by:  As directed    Increase activity slowly    Complete by:  As directed    Increase activity slowly    Complete by:  As directed      Current Discharge Medication List    CONTINUE these medications which have CHANGED   Details  amLODipine (NORVASC) 10 MG tablet Take 1 tablet (10 mg total) by mouth daily. Refills: 0      CONTINUE these medications which have NOT CHANGED   Details  ALPRAZolam (XANAX) 0.25 MG tablet Take 0.25 mg by mouth 3 (three) times daily as needed for anxiety.    aspirin 81 MG tablet Take 81 mg by mouth daily.    atorvastatin (LIPITOR) 40 MG tablet Take 40 mg by mouth daily.    carvedilol (COREG) 25 MG tablet  Take 25 mg by mouth 2 (two) times daily with a meal.    cycloSPORINE (RESTASIS) 0.05 % ophthalmic emulsion Place 1 drop into both eyes 2 (two) times daily.    furosemide (LASIX) 40 MG tablet Take 1 tablet (40 mg total) by mouth daily. Qty: 90 tablet, Refills: 3    losartan (COZAAR) 100 MG tablet Take 100 mg by mouth daily.    metFORMIN (GLUCOPHAGE-XR) 500 MG 24 hr tablet Take 1,000 mg by mouth daily.     nitroGLYCERIN (NITROSTAT) 0.4 MG SL tablet Place 0.4 mg under the tongue every 5 (five) minutes x 3 doses as needed. (chest pain)    Omega-3 Fatty Acids (OMEGA 3 PO) Take 2 tablets by mouth daily.    probenecid (BENEMID) 500 MG tablet Take 500 mg by mouth 2 (two) times  daily.       Allergies  Allergen Reactions  . Metoprolol Nausea And Vomiting    "Throws up white foamy liquid"  . Percocet [Oxycodone-Acetaminophen] Nausea And Vomiting  . Benazepril Cough   Follow-up Information    Jerral Ralph, MD. Schedule an appointment as soon as possible for a visit in 1 week(s).   Specialty:  Internal Medicine Contact information: [address] [place] [address] [phone]        Verdis Prime. Schedule an appointment as soon as possible for a visit in 2 week(s).            The results of significant diagnostics from this hospitalization (including imaging, microbiology, ancillary and laboratory) are listed below for reference.    Significant Diagnostic Studies: Dg Chest 2 View  Result Date: 01/29/2017 CLINICAL DATA:  Dizziness, chest pain, and diaphoresis tonight. History of diabetes and hypertension. EXAM: CHEST  2 VIEW COMPARISON:  03/30/2014 FINDINGS: Postoperative changes in the mediastinum. Previous left shoulder arthroplasty. Surgical clips in the right upper quadrant. Extremely shallow inspiration with probable linear atelectasis in the mid lungs and lung bases. No focal consolidation. No blunting of costophrenic angles. No pneumothorax. There is nodular prominence of the right hilum measuring 2.7 cm. While this may be due to vascular crowding with shallow inspiration, a hilar mass or lymphadenopathy are not excluded. Consider repeat chest x-ray with improved inspiration versus CT for further evaluation. Calcification of the aorta. Degenerative changes in the spine. IMPRESSION: Extremely shallow inspiration with linear atelectasis in the lower lungs. Nodular prominence of the right hilum may be due to artifact but hilar mass/ lymphadenopathy is not excluded. Consider repeat chest x-ray with improved inspiration versus CT for further evaluation. Electronically Signed   By: Burman Nieves M.D.   On: 01/29/2017 21:28   Ct Chest Wo  Contrast  Result Date: 01/29/2017 CLINICAL DATA:  71 y/o F; abnormal chest radiograph. History of CABG. EXAM: CT CHEST WITHOUT CONTRAST TECHNIQUE: Multidetector CT imaging of the chest was performed following the standard protocol without IV contrast. COMPARISON:  01/29/2017 chest radiograph. FINDINGS: Cardiovascular: Normal heart size. Severe coronary artery calcification. Aortic and mitral annular calcification. Normal caliber thoracic aorta with severe calcific atherosclerosis. Normal caliber main pulmonary artery. Status post CABG. Mediastinum/Nodes: No enlarged mediastinal or axillary lymph nodes. Thyroid gland, trachea, and esophagus demonstrate no significant findings. Opacity on radiograph was likely a vascular confluence due to low lung volumes. Elevated right hemidiaphragm. Lungs/Pleura: Lungs are clear. No pleural effusion or pneumothorax. Upper Abdomen: Status post cholecystectomy.  Aortic atherosclerosis. Musculoskeletal: Moderate multilevel degenerative changes of the thoracic spine. No acute osseous abnormality is identified. Healed median  sternotomy. Partially visualized left shoulder reverse arthroplasty. IMPRESSION: 1. Opacity on radiograph in the right hilum was likely a vascular confluence due to low lung volumes and elevated right hemidiaphragm. No hilar mass or adenopathy identified. 2. No acute process. 3. Severe coronary artery calcification.  Status post CABG. 4. Severe aortic calcific atherosclerosis. Electronically Signed   By: Mitzi HansenLance  Furusawa-Stratton M.D.   On: 01/29/2017 23:01   Nm Myocar Multi W/spect W/wall Motion / Ef  Result Date: 01/31/2017  This is a low risk study.  Defect in the anterior(mid/distal), anteroseptal (distal) and apical wall consistent with probable normal perfusion and soft tissue attenuation (breast, subcutaneous fat). No significant ischemia or significant scar.  Defect 1: There is a small defect of moderate severity present in the mid anterior, apical  anterior, apical septal and apex location.     Microbiology: No results found for this or any previous visit (from the past 240 hour(s)).   Labs: Basic Metabolic Panel:  Recent Labs Lab 01/29/17 2051  NA 132*  K 4.1  CL 95*  CO2 23  GLUCOSE 81  BUN 26*  CREATININE 1.30*  CALCIUM 9.1   Liver Function Tests: No results for input(s): AST, ALT, ALKPHOS, BILITOT, PROT, ALBUMIN in the last 168 hours. No results for input(s): LIPASE, AMYLASE in the last 168 hours. No results for input(s): AMMONIA in the last 168 hours. CBC:  Recent Labs Lab 01/29/17 2051  WBC 14.7*  HGB 12.3  HCT 36.9  MCV 90.0  PLT 209   Cardiac Enzymes:  Recent Labs Lab 01/30/17 0644 01/30/17 1300 01/30/17 2026 01/31/17 0306 01/31/17 0829  TROPONINI <0.03 0.09* 0.10* 0.08* 0.06*   BNP: BNP (last 3 results) No results for input(s): BNP in the last 8760 hours.  ProBNP (last 3 results) No results for input(s): PROBNP in the last 8760 hours.  CBG:  Recent Labs Lab 01/30/17 0847 01/30/17 1643 01/30/17 2115 01/31/17 0739 01/31/17 1210  GLUCAP 129* 191* 133* 151* 167*       SignedZannie Cove:  Marquie Aderhold MD.  [facility] 01/31/2017, 3:26 PM

## 2017-01-31 NOTE — Progress Notes (Signed)
Pt has orders to be discharged. Discharge instructions given and pt has no additional questions at this time. Medication regimen reviewed and pt educated. Pt verbalized understanding and has no additional questions. Telemetry box removed. IV removed and site in good condition. Pt stable and waiting for transportation. 

## 2017-01-31 NOTE — Progress Notes (Signed)
Progress Note  Patient Name: Hailey Levine Date of Encounter: 01/31/2017  Primary Cardiologist: Dr. Katrinka Blazing   Subjective   No chest pain or dyspnea at present. She had mild nausea during stress test that resolved by test completion.   Inpatient Medications    Scheduled Meds: . hydrALAZINE      . amLODipine  10 mg Oral Daily  . aspirin  81 mg Oral Daily  . atorvastatin  40 mg Oral q1800  . carvedilol  25 mg Oral BID WC  . cycloSPORINE  1 drop Both Eyes BID  . enoxaparin (LOVENOX) injection  40 mg Subcutaneous Q24H  . insulin aspart  0-15 Units Subcutaneous TID WC  . losartan  100 mg Oral Daily   Continuous Infusions:  PRN Meds: acetaminophen, ALPRAZolam, gi cocktail, hydrALAZINE, morphine injection, ondansetron (ZOFRAN) IV   Vital Signs    Vitals:   01/31/17 1017 01/31/17 1019 01/31/17 1021 01/31/17 1023  BP: (!) 189/64 (!) 157/55 (!) 154/55 (!) 119/31  Pulse:      Resp: 18 (!) 24 20 20   Temp:      TempSrc:      SpO2:      Weight:      Height:        Intake/Output Summary (Last 24 hours) at 01/31/17 1053 Last data filed at 01/31/17 0840  Gross per 24 hour  Intake              240 ml  Output             2250 ml  Net            -2010 ml   Filed Weights   01/29/17 2038 01/30/17 1031 01/31/17 0524  Weight: 150 lb (68 kg) 156 lb 9.6 oz (71 kg) 154 lb 4.8 oz (70 kg)    Telemetry    NSR - Personally Reviewed    Physical Exam   GEN: No acute distress.   Neck: No JVD Cardiac: RRR, no murmurs, rubs, or gallops.  Respiratory: Clear to auscultation bilaterally. GI: Soft, nontender, non-distended  MS: No edema; No deformity. Neuro:  Nonfocal  Psych: Normal affect   Labs    Chemistry Recent Labs Lab 01/29/17 2051  NA 132*  K 4.1  CL 95*  CO2 23  GLUCOSE 81  BUN 26*  CREATININE 1.30*  CALCIUM 9.1  GFRNONAA 40*  GFRAA 46*  ANIONGAP 14     Hematology Recent Labs Lab 01/29/17 2051  WBC 14.7*  RBC 4.10  HGB 12.3  HCT 36.9  MCV 90.0   MCH 30.0  MCHC 33.3  RDW 14.8  PLT 209    Cardiac Enzymes Recent Labs Lab 01/30/17 1300 01/30/17 2026 01/31/17 0306 01/31/17 0829  TROPONINI 0.09* 0.10* 0.08* 0.06*    Recent Labs Lab 01/29/17 2117  TROPIPOC 0.00     BNPNo results for input(s): BNP, PROBNP in the last 168 hours.   DDimer No results for input(s): DDIMER in the last 168 hours.   Radiology    Dg Chest 2 View  Result Date: 01/29/2017 CLINICAL DATA:  Dizziness, chest pain, and diaphoresis tonight. History of diabetes and hypertension. EXAM: CHEST  2 VIEW COMPARISON:  03/30/2014 FINDINGS: Postoperative changes in the mediastinum. Previous left shoulder arthroplasty. Surgical clips in the right upper quadrant. Extremely shallow inspiration with probable linear atelectasis in the mid lungs and lung bases. No focal consolidation. No blunting of costophrenic angles. No pneumothorax. There is nodular prominence of the right  hilum measuring 2.7 cm. While this may be due to vascular crowding with shallow inspiration, a hilar mass or lymphadenopathy are not excluded. Consider repeat chest x-ray with improved inspiration versus CT for further evaluation. Calcification of the aorta. Degenerative changes in the spine. IMPRESSION: Extremely shallow inspiration with linear atelectasis in the lower lungs. Nodular prominence of the right hilum may be due to artifact but hilar mass/ lymphadenopathy is not excluded. Consider repeat chest x-ray with improved inspiration versus CT for further evaluation. Electronically Signed   By: Burman NievesWilliam  Stevens M.D.   On: 01/29/2017 21:28   Ct Chest Wo Contrast  Result Date: 01/29/2017 CLINICAL DATA:  71 y/o F; abnormal chest radiograph. History of CABG. EXAM: CT CHEST WITHOUT CONTRAST TECHNIQUE: Multidetector CT imaging of the chest was performed following the standard protocol without IV contrast. COMPARISON:  01/29/2017 chest radiograph. FINDINGS: Cardiovascular: Normal heart size. Severe coronary  artery calcification. Aortic and mitral annular calcification. Normal caliber thoracic aorta with severe calcific atherosclerosis. Normal caliber main pulmonary artery. Status post CABG. Mediastinum/Nodes: No enlarged mediastinal or axillary lymph nodes. Thyroid gland, trachea, and esophagus demonstrate no significant findings. Opacity on radiograph was likely a vascular confluence due to low lung volumes. Elevated right hemidiaphragm. Lungs/Pleura: Lungs are clear. No pleural effusion or pneumothorax. Upper Abdomen: Status post cholecystectomy.  Aortic atherosclerosis. Musculoskeletal: Moderate multilevel degenerative changes of the thoracic spine. No acute osseous abnormality is identified. Healed median sternotomy. Partially visualized left shoulder reverse arthroplasty. IMPRESSION: 1. Opacity on radiograph in the right hilum was likely a vascular confluence due to low lung volumes and elevated right hemidiaphragm. No hilar mass or adenopathy identified. 2. No acute process. 3. Severe coronary artery calcification.  Status post CABG. 4. Severe aortic calcific atherosclerosis. Electronically Signed   By: Mitzi HansenLance  Furusawa-Stratton M.D.   On: 01/29/2017 23:01    Cardiac Studies   NST- results pending   Patient Profile     71 y/o woman with a history of CAD s/p MI and 3v CABG in 2002 with subsequent stenting 2003, HTN, HLD, DM, and CKD stage 3 who developed dizziness and diaphoresis that felt similar to her past MI and so came to the ED for evaluation.    Assessment & Plan    1. Chest pain: Her symptoms are atypical although similar to past MI event. Questionable electrographic changes and a very slight enzyme elevation. She certainly is at risk of new vessel blockage 16 years out from her CABG. Troponin is very mildly elevated at 0.06>>0.08>>0.10 in a patient with CKD3. Ischemic w/u in process. NST completed and results are pending. If high risk stress test, she will need a definitive cath.  - continue  medical therapy for CAD  2. Hypertension: Remains elevated at times. Continue to monitor closely -Continue lasix at least 40mg   -Coreg 25mg  BID -Amlodipine 10mg  daily -losartan 100mg  daily -Hydralazine PRN   3. HLD: On lipitor 40mg  already  4. CKD stage 3: She appears to be near baseline renal function SCr 1.30. Has 1+ edema not obviously volume overloaded  5. T2DM: Management per primary team  Signed, Robbie LisBrittainy Simmons, PA-C  01/31/2017, 10:53 AM     Attending Note:   The patient was seen and examined.  Agree with assessment and plan as noted above.  Changes made to the above note as needed.  Patient seen and independently examined with Cathlean MarseillesBritainy Simmons, PA .   We discussed all aspects of the encounter. I agree with the assessment and plan as stated above.  1. Chest pain :  myoview is pending  No further episodes of CP     I have spent a total of 40 minutes with patient reviewing hospital  notes , telemetry, EKGs, labs and examining patient as well as establishing an assessment and plan that was discussed with the patient. > 50% of time was spent in direct patient care.    Vesta Mixer, Montez Hageman., MD, [facility] 01/31/2017, 2:47 PM [address]  [address] Office [phone] Pager [phone]

## 2017-03-20 ENCOUNTER — Encounter: Payer: Self-pay | Admitting: Interventional Cardiology

## 2017-03-24 ENCOUNTER — Other Ambulatory Visit: Payer: Self-pay | Admitting: Interventional Cardiology

## 2017-04-01 NOTE — Progress Notes (Signed)
Cardiology Office Note    Date:  04/02/2017   ID:  Hailey Levine, DOB [birth year], MRN [id number]  PCP:  Jerral Ralph, MD  Cardiologist: Lesleigh Noe, MD   Chief Complaint  Patient presents with  . Coronary Artery Disease    History of Present Illness:  Hailey Levine is a 71 y.o. female with a history of CAD s/p MI and 3v CABG in 2002 with subsequent stenting 2003, HTN, HLD, DM, and CKD stage 3.  Hailey Levine was admitted to the hospital in the second week of February with sudden onset dizziness and diaphoresis. No findings to suggest myocardial infarction were noted. A nuclear study was felt to be low risk. EKGs were nonischemic. The entire episode lasted 30 minutes. Since that time she has had no difficulty. She has been active. She has not needed nitroglycerin. She denies shortness of breath.  Past Medical History:  Diagnosis Date  . Arthritis   . CKD (chronic kidney disease)   . Coronary artery disease   . Depression   . Diabetes mellitus without complication (HCC)   . Hyperlipidemia   . Hypertension   . Myocardial infarction 2002   MI x 2 - one prior to 2002    Past Surgical History:  Procedure Laterality Date  . ABDOMINAL HYSTERECTOMY    . CAROTID ENDARTERECTOMY Left 05/21/2001  . CHOLECYSTECTOMY    . CORONARY ANGIOPLASTY WITH STENT PLACEMENT  2003  . CORONARY ARTERY BYPASS GRAFT  2002  . EYE SURGERY Bilateral    cataract  . OVARIAN CYST REMOVAL    . REVERSE SHOULDER ARTHROPLASTY Left 04/05/2014   Procedure: LEFT REVERSE SHOULDER ARTHROPLASTY;  Surgeon: Senaida Lange, MD;  Location: [facility] OR;  Service: Orthopedics;  Laterality: Left;  . TUBAL LIGATION      Current Medications: Outpatient Medications Prior to Visit  Medication Sig Dispense Refill  . ALPRAZolam (XANAX) 0.25 MG tablet Take 0.25 mg by mouth 3 (three) times daily as needed for anxiety.    Marland Kitchen amLODipine (NORVASC) 10 MG tablet Take 1 tablet (10 mg total) by mouth daily.  0  . aspirin 81  MG tablet Take 81 mg by mouth daily.    Marland Kitchen atorvastatin (LIPITOR) 40 MG tablet Take 40 mg by mouth daily.    . carvedilol (COREG) 25 MG tablet Take 25 mg by mouth 2 (two) times daily with a meal.    . cycloSPORINE (RESTASIS) 0.05 % ophthalmic emulsion Place 1 drop into both eyes 2 (two) times daily.    Marland Kitchen losartan (COZAAR) 100 MG tablet Take 100 mg by mouth daily.    . metFORMIN (GLUCOPHAGE-XR) 500 MG 24 hr tablet Take 1,000 mg by mouth daily.     . nitroGLYCERIN (NITROSTAT) 0.4 MG SL tablet Place 0.4 mg under the tongue every 5 (five) minutes x 3 doses as needed. (chest pain)    . Omega-3 Fatty Acids (OMEGA 3 PO) Take 2 capsules by mouth daily.     . probenecid (BENEMID) 500 MG tablet Take 500 mg by mouth 2 (two) times daily.    . furosemide (LASIX) 40 MG tablet TAKE 1 TABLET BY MOUTH  DAILY (Patient not taking: Reported on 04/02/2017) 90 tablet 0   No facility-administered medications prior to visit.      Allergies:   Metoprolol; Percocet [oxycodone-acetaminophen]; and Benazepril   Social History   Social History  . Marital status: Widowed    Spouse name: N/A  . Number of children: N/A  .  Years of education: N/A   Occupational History  . retired    Social History Main Topics  . Smoking status: Former Smoker    Years: 36.00    Quit date: 12/16/1998  . Smokeless tobacco: Never Used  . Alcohol use 0.0 oz/week     Comment: strawberry dacquiri once a year  . Drug use: No  . Sexual activity: Not Asked   Other Topics Concern  . None   Social History Narrative  . None     Family History:  The patient's family history includes Heart attack (age of onset: 32) in her father.   ROS:   Please see the history of present illness.    Wishes to have plastic surgery. Also note she is well-nourished to consider the procedure.  All other systems reviewed and are negative.   PHYSICAL EXAM:   VS:  Pulse 66   Ht  (1.473 m)   Wt 163 lb 6.4 oz (74.1 kg)   BMI 34.15 kg/m    GEN:  Well nourished, well developed, in no acute distress  HEENT: normal  Neck: no JVD, carotid bruits, or masses Cardiac: RRR; no murmurs, rubs, or gallops,no edema  Respiratory:  clear to auscultation bilaterally, normal work of breathing GI: soft, nontender, nondistended, + BS MS: no deformity or atrophy  Skin: warm and dry, no rash Neuro:  Alert and Oriented x 3, Strength and sensation are intact Psych: euthymic mood, full affect  Wt Readings from Last 3 Encounters:  04/02/17 163 lb 6.4 oz (74.1 kg)  01/31/17 154 lb 4.8 oz (70 kg)  06/12/16 159 lb 12.8 oz (72.5 kg)      Studies/Labs Reviewed:   EKG:  EKG  01/31/17, normal sinus rhythm with poor R-wave progression, and nonspecific ST abnormality.  Recent Labs: 01/29/2017: BUN 26; Creatinine, Ser 1.30; Hemoglobin 12.3; Platelets 209; Potassium 4.1; Sodium 132 01/30/2017: TSH 2.955   Lipid Panel    Component Value Date/Time   CHOL 167 01/30/2017 0116   TRIG 85 01/30/2017 0116   HDL 42 01/30/2017 0116   CHOLHDL 4.0 01/30/2017 0116   VLDL 17 01/30/2017 0116   LDLCALC 108 (H) 01/30/2017 0116    Additional studies/ records that were reviewed today include:  Lexiscan myocardial perfusion study 01/31/17: Study Result    This is a low risk study.  Defect in the anterior(mid/distal), anteroseptal (distal) and apical wall consistent with probable normal perfusion and soft tissue attenuation (breast, subcutaneous fat). No significant ischemia or significant scar.  Defect 1: There is a small defect of moderate severity present in the mid anterior, apical anterior, apical septal and apex location.  LVEF was greater than 65%    ASSESSMENT:    1. Coronary artery disease involving coronary bypass graft of native heart with angina pectoris (HCC)   2. CKD stage 3 due to type 2 diabetes mellitus (HCC)   3. Essential hypertension   4. Hyperlipidemia, unspecified hyperlipidemia type   5. Diabetes mellitus type 2 in obese [facility])      PLAN:   In order of problems listed above:  1. Recent chest discomfort proven not to be a myocardial infarction or angina. Low risk myocardial perfusion study done in February. Aerobic activity is recommended. 2. Followed by primary care 3. Adequate control. 2 g sodium diet. 4. Lipids are not well controlled with most recent LDL of 106. Tighten diet, increase activity, and perform a statin panel in 4 weeks. May need to up titrate therapy if LDL  is too high. 5. Tight diabetes control with hemoglobin A1c less than 7 is recommended.    Medication Adjustments/Labs and Tests Ordered: Current medicines are reviewed at length with the patient today.  Concerns regarding medicines are outlined above.  Medication changes, Labs and Tests ordered today are listed in the Patient Instructions below. Patient Instructions  Medication Instructions:  None  Labwork: None  Testing/Procedures: None  Follow-Up: Your physician wants you to follow-up in: 1 year with Dr. Katrinka Blazing. You will receive a reminder letter in the mail two months in advance. If you don't receive a letter, please call our office to schedule the follow-up appointment.   Any Other Special Instructions Will Be Listed Below (If Applicable).     If you need a refill on your cardiac medications before your next appointment, please call your pharmacy.      Signed, Lesleigh Noe, MD  04/02/2017 10:21 AM    [facility] [address] [place], [place], [place]  [address] Phone: [phone]; Fax: [phone]

## 2017-04-02 ENCOUNTER — Encounter: Payer: Self-pay | Admitting: Interventional Cardiology

## 2017-04-02 ENCOUNTER — Ambulatory Visit (INDEPENDENT_AMBULATORY_CARE_PROVIDER_SITE_OTHER): Payer: Medicare Other | Admitting: Interventional Cardiology

## 2017-04-02 ENCOUNTER — Encounter (INDEPENDENT_AMBULATORY_CARE_PROVIDER_SITE_OTHER): Payer: Self-pay

## 2017-04-02 VITALS — HR 66 | Ht <= 58 in | Wt 163.4 lb

## 2017-04-02 DIAGNOSIS — I25709 Atherosclerosis of coronary artery bypass graft(s), unspecified, with unspecified angina pectoris: Secondary | ICD-10-CM | POA: Diagnosis not present

## 2017-04-02 DIAGNOSIS — E785 Hyperlipidemia, unspecified: Secondary | ICD-10-CM

## 2017-04-02 DIAGNOSIS — E669 Obesity, unspecified: Secondary | ICD-10-CM

## 2017-04-02 DIAGNOSIS — E1169 Type 2 diabetes mellitus with other specified complication: Secondary | ICD-10-CM

## 2017-04-02 DIAGNOSIS — N183 Chronic kidney disease, stage 3 unspecified: Secondary | ICD-10-CM

## 2017-04-02 DIAGNOSIS — I10 Essential (primary) hypertension: Secondary | ICD-10-CM | POA: Diagnosis not present

## 2017-04-02 DIAGNOSIS — E1122 Type 2 diabetes mellitus with diabetic chronic kidney disease: Secondary | ICD-10-CM

## 2017-04-02 NOTE — Patient Instructions (Addendum)
Medication Instructions:  None  Labwork: Your physician recommends that you return for lab work in: Mid May (Lipid, Liver)   Testing/Procedures: None  Follow-Up: Your physician wants you to follow-up in: 1 year with Dr. Katrinka Blazing. You will receive a reminder letter in the mail two months in advance. If you don't receive a letter, please call our office to schedule the follow-up appointment.   Any Other Special Instructions Will Be Listed Below (If Applicable).     If you need a refill on your cardiac medications before your next appointment, please call your pharmacy.

## 2017-04-25 ENCOUNTER — Other Ambulatory Visit: Payer: Self-pay | Admitting: Interventional Cardiology

## 2017-04-25 MED ORDER — FUROSEMIDE 40 MG PO TABS: 60.0000 mg | ORAL_TABLET | Freq: Every day | ORAL | 3 refills | Status: DC

## 2017-05-12 ENCOUNTER — Other Ambulatory Visit: Payer: Medicare Other

## 2017-05-12 DIAGNOSIS — E785 Hyperlipidemia, unspecified: Secondary | ICD-10-CM

## 2017-05-12 DIAGNOSIS — I25709 Atherosclerosis of coronary artery bypass graft(s), unspecified, with unspecified angina pectoris: Secondary | ICD-10-CM

## 2017-05-12 LAB — LIPID PANEL
CHOLESTEROL TOTAL: 188 mg/dL (ref 100–199)
Chol/HDL Ratio: 3.8 ratio (ref 0.0–4.4)
HDL: 49 mg/dL (ref >39)
LDL Calculated: 108 mg/dL — ABNORMAL HIGH (ref 0–99)
Triglycerides: 153 mg/dL — ABNORMAL HIGH (ref 0–149)
VLDL CHOLESTEROL CAL: 31 mg/dL (ref 5–40)

## 2017-05-12 LAB — HEPATIC FUNCTION PANEL
ALBUMIN: 4.5 g/dL (ref 3.5–4.8)
ALT: 12 IU/L (ref 0–32)
AST: 22 IU/L (ref 0–40)
Alkaline Phosphatase: 71 IU/L (ref 39–117)
BILIRUBIN TOTAL: 0.3 mg/dL (ref 0.0–1.2)
Bilirubin, Direct: 0.1 mg/dL (ref 0.00–0.40)
TOTAL PROTEIN: 6.9 g/dL (ref 6.0–8.5)

## 2017-09-12 ENCOUNTER — Other Ambulatory Visit: Payer: Self-pay | Admitting: Interventional Cardiology

## 2017-10-14 ENCOUNTER — Encounter (HOSPITAL_COMMUNITY): Payer: Self-pay | Admitting: Nurse Practitioner

## 2017-10-14 ENCOUNTER — Emergency Department (HOSPITAL_COMMUNITY)
Admission: EM | Admit: 2017-10-14 | Discharge: 2017-10-14 | Disposition: A | Discharge status: Home / Self Care | Payer: Medicare Other | Attending: Emergency Medicine | Admitting: Emergency Medicine

## 2017-10-14 ENCOUNTER — Emergency Department (HOSPITAL_COMMUNITY): Payer: Medicare Other

## 2017-10-14 DIAGNOSIS — Z7982 Long term (current) use of aspirin: Secondary | ICD-10-CM | POA: Insufficient documentation

## 2017-10-14 DIAGNOSIS — N183 Chronic kidney disease, stage 3 (moderate): Secondary | ICD-10-CM | POA: Diagnosis not present

## 2017-10-14 DIAGNOSIS — Z7984 Long term (current) use of oral hypoglycemic drugs: Secondary | ICD-10-CM | POA: Diagnosis not present

## 2017-10-14 DIAGNOSIS — Z96612 Presence of left artificial shoulder joint: Secondary | ICD-10-CM | POA: Insufficient documentation

## 2017-10-14 DIAGNOSIS — R0602 Shortness of breath: Secondary | ICD-10-CM | POA: Insufficient documentation

## 2017-10-14 DIAGNOSIS — Z955 Presence of coronary angioplasty implant and graft: Secondary | ICD-10-CM | POA: Diagnosis not present

## 2017-10-14 DIAGNOSIS — Z951 Presence of aortocoronary bypass graft: Secondary | ICD-10-CM | POA: Diagnosis not present

## 2017-10-14 DIAGNOSIS — I2510 Atherosclerotic heart disease of native coronary artery without angina pectoris: Secondary | ICD-10-CM | POA: Diagnosis not present

## 2017-10-14 DIAGNOSIS — E1122 Type 2 diabetes mellitus with diabetic chronic kidney disease: Secondary | ICD-10-CM | POA: Insufficient documentation

## 2017-10-14 DIAGNOSIS — Z87891 Personal history of nicotine dependence: Secondary | ICD-10-CM | POA: Insufficient documentation

## 2017-10-14 DIAGNOSIS — I129 Hypertensive chronic kidney disease with stage 1 through stage 4 chronic kidney disease, or unspecified chronic kidney disease: Secondary | ICD-10-CM | POA: Insufficient documentation

## 2017-10-14 DIAGNOSIS — Z79899 Other long term (current) drug therapy: Secondary | ICD-10-CM | POA: Insufficient documentation

## 2017-10-14 DIAGNOSIS — R899 Unspecified abnormal finding in specimens from other organs, systems and tissues: Secondary | ICD-10-CM

## 2017-10-14 LAB — BASIC METABOLIC PANEL
ANION GAP: 16 — AB (ref 5–15)
BUN: 28 mg/dL — ABNORMAL HIGH (ref 6–20)
CHLORIDE: 93 mmol/L — AB (ref 101–111)
CO2: 24 mmol/L (ref 22–32)
Calcium: 9.5 mg/dL (ref 8.9–10.3)
Creatinine, Ser: 1.82 mg/dL — ABNORMAL HIGH (ref 0.44–1.00)
GFR calc non Af Amer: 26 mL/min — ABNORMAL LOW (ref >60)
GFR, EST AFRICAN AMERICAN: 31 mL/min — AB (ref >60)
Glucose, Bld: 183 mg/dL — ABNORMAL HIGH (ref 65–99)
POTASSIUM: 4.3 mmol/L (ref 3.5–5.1)
Sodium: 133 mmol/L — ABNORMAL LOW (ref 135–145)

## 2017-10-14 LAB — CBC
HEMATOCRIT: 36.1 % (ref 36.0–46.0)
HEMOGLOBIN: 11.7 g/dL — AB (ref 12.0–15.0)
MCH: 29.2 pg (ref 26.0–34.0)
MCHC: 32.4 g/dL (ref 30.0–36.0)
MCV: 90 fL (ref 78.0–100.0)
Platelets: 221 10*3/uL (ref 150–400)
RBC: 4.01 MIL/uL (ref 3.87–5.11)
RDW: 14.9 % (ref 11.5–15.5)
WBC: 9.9 10*3/uL (ref 4.0–10.5)

## 2017-10-14 LAB — I-STAT TROPONIN, ED: Troponin i, poc: 0.07 ng/mL (ref 0.00–0.08)

## 2017-10-14 NOTE — ED Provider Notes (Signed)
[facility] EM[facility]GENCY [facility] Provider Note   CSN: [id number] Arrival date & time: 10/14/17  1634     History   Chief Complaint Chief Complaint  Patient presents with  . Shortness of Breath    HPI Hailey Levine is a 71 y.o. female w past medical history see daily, MI, hypertension, diabetes, presenting to the ED as recommended by her PCP. Patient with elevated troponin on Monday to 0.11. Patient was seen on Wednesday of last week by her PCP, for worsening shortness of breath. She states chest x-ray showed some fluid on her lungs, and she also had lower extremity edema. States they checked some labs that day, and started her on Lasix. Her troponin last week was 0.07. She states since taking the Lasix for edema has improved significantly, she is not short of breath and feels "great." She states she followed up with her PCP on Monday, who rechecked labs, and called her today's recommending she reports to the ED for elevated troponin. She denies chest pain, shortness of breath, headache, vision changes, nausea, vomiting, abdominal pain, any other complaints today. Per chart review, nuclear stress test done 01/31/2017, with EF of >65%.  The history is provided by the patient.    Past Medical History:  Diagnosis Date  . Arthritis   . CKD (chronic kidney disease)   . Coronary artery disease   . Depression   . Diabetes mellitus without complication (HCC)   . Hyperlipidemia   . Hypertension   . Myocardial infarction [facility](HCC) 2002   MI x 2 - one prior to 2002    Patient Active Problem List   Diagnosis Date Noted  . Diabetes mellitus type 2 in obese (HCC) 01/30/2017  . CKD stage 3 due to type 2 diabetes mellitus (HCC) 01/30/2017  . Chest pain 01/29/2017  . 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 Coronary artery disease involving coronary bypass graft of native heart with angina pectoris (HCC) 02/28/2014  . Essential hypertension 02/28/2014  . Hyperlipidemia  02/28/2014    Past Surgical History:  Procedure Laterality Date  . ABDOMINAL HYSTERECTOMY    . CAROTID ENDARTERECTOMY Left 05/21/2001  . CHOLECYSTECTOMY    . CORONARY ANGIOPLASTY WITH STENT PLACEMENT  2003  . CORONARY ARTERY BYPASS GRAFT  2002  . EYE SURGERY Bilateral    cataract  . OVARIAN CYST REMOVAL    . REVERSE SHOULDER ARTHROPLASTY Left 04/05/2014   Procedure: LEFT REVERSE SHOULDER ARTHROPLASTY;  Surgeon: Senaida LangeKevin M Supple, MD;  Location: [facility] OR;  Service: Orthopedics;  Laterality: Left;  . TUBAL LIGATION      OB History    No data available       Home Medications    Prior to Admission medications   Medication Sig Start Date End Date Taking? Authorizing Provider  ALPRAZolam (XANAX) 0.25 MG tablet Take 0.25 mg by mouth 3 (three) times daily as needed for anxiety.    [provider]  amLODipine (NORVASC) 10 MG tablet Take 1 tablet (10 mg total) by mouth daily. 01/31/17   Zannie CoveJoseph, Preetha, MD  aspirin 81 MG tablet Take 81 mg by mouth daily.    [provider]  atorvastatin (LIPITOR) 40 MG tablet Take 40 mg by mouth daily.    [provider]  carvedilol (COREG) 25 MG tablet Take 25 mg by mouth 2 (two) times daily with a meal.    [provider]  cycloSPORINE (RESTASIS) 0.05 % ophthalmic emulsion Place 1 drop into both eyes 2 (two)  times daily.    [provider]  furosemide (LASIX) 40 MG tablet TAKE 1 AND 1/2 TABLETS BY  MOUTH DAILY 09/12/17   Lyn Records, MD  losartan (COZAAR) 100 MG tablet Take 100 mg by mouth daily.    [provider]  metFORMIN (GLUCOPHAGE-XR) 500 MG 24 hr tablet Take 1,000 mg by mouth daily.     [provider]  nitroGLYCERIN (NITROSTAT) 0.4 MG SL tablet Place 0.4 mg under the tongue every 5 (five) minutes x 3 doses as needed. (chest pain) 01/08/16   [provider]  Omega-3 Fatty Acids (OMEGA 3 PO) Take 2 capsules by mouth daily.     [provider]  probenecid (BENEMID) 500 MG  tablet Take 500 mg by mouth 2 (two) times daily.    [provider]    Family History Family History  Problem Relation Age of Onset  . Heart attack Father 26    Social History Social History  Substance Use Topics  . Smoking status: Former Smoker    Years: 36.00    Quit date: 12/16/1998  . Smokeless tobacco: Never Used  . Alcohol use 0.0 oz/week     Comment: strawberry dacquiri once a year     Allergies   Metoprolol; Percocet [oxycodone-acetaminophen]; and Benazepril   Review of Systems Review of Systems  Constitutional: Negative for chills, diaphoresis and fever.  Respiratory: Negative for chest tightness and shortness of breath.   Cardiovascular: Negative for chest pain and palpitations.  Gastrointestinal: Negative for nausea and vomiting.  Neurological: Negative for weakness, light-headedness and headaches.  All other systems reviewed and are negative.    Physical Exam Updated Vital Signs BP (!) 156/61   Pulse 74   Temp 98.1 F (36.7 C) (Oral)   Resp 17   Ht 4\' 10"  (1.473 m)   Wt 73.9 kg (163 lb)   SpO2 97%   BMI 34.07 kg/m   Physical Exam  Constitutional: She appears well-developed and well-nourished. No distress.  Obese  HENT:  Head: Normocephalic and atraumatic.  Mouth/Throat: Oropharynx is clear and moist.  Eyes: Pupils are equal, round, and reactive to light. Conjunctivae and EOM are normal.  Neck: Normal range of motion. Neck supple.  Cardiovascular: Normal rate, regular rhythm, normal heart sounds and intact distal pulses.  Exam reveals no friction rub.   No murmur heard. Pulmonary/Chest: Effort normal and breath sounds normal. No respiratory distress. She has no wheezes. She has no rales.  Abdominal: Soft. Bowel sounds are normal. She exhibits no distension. There is no tenderness. There is no rebound and no guarding.  Musculoskeletal:  1+ pitting edema bilateral lower extremities. No erythema or tenderness.  Neurological: She is  alert.  Skin: Skin is warm. She is not diaphoretic.  Psychiatric: She has a normal mood and affect. Her behavior is normal.  Nursing note and vitals reviewed.    ED Treatments / Results  Labs (all labs ordered are listed, but only abnormal results are displayed) Labs Reviewed  BASIC METABOLIC PANEL - Abnormal; Notable for the following:       Result Value   Sodium 133 (*)    Chloride 93 (*)    Glucose, Bld 183 (*)    BUN 28 (*)    Creatinine, Ser 1.82 (*)    GFR calc non Af Amer 26 (*)    GFR calc Af Amer 31 (*)    Anion gap 16 (*)    All other components within normal limits  CBC - Abnormal; Notable for the following:    Hemoglobin 11.7 (*)    All other components within normal limits  I-STAT TROPONIN, ED    EKG  EKG Interpretation None       Radiology Dg Chest 2 View  Result Date: 10/14/2017 CLINICAL DATA:  Shortness of breath over the past week. Elevated troponin today. EXAM: CHEST  2 VIEW COMPARISON:  PA and lateral chest 10/08/2017.  CT chest 01/29/2017. FINDINGS: Mild asymmetric elevation of the right hemidiaphragm is unchanged. Heart size is enlarged. No pulmonary edema. Lungs clear. No pneumothorax or pleural effusion. No acute bony abnormality. The patient is status post CABG and left shoulder replacement. IMPRESSION: Cardiomegaly without acute disease. Electronically Signed   By: Drusilla Kanner M.D.   On: 10/14/2017 17:30    Procedures Procedures (including critical care time)  Medications Ordered in ED Medications - No data to display   Initial Impression / Assessment and Plan / ED Course  I have reviewed the triage vital signs and the nursing notes.  Pertinent labs & imaging results that were available during my care of the patient were reviewed by me and considered in my medical decision making (see chart for details).     Patient presenting per recommendation from PCP for elevated troponin to 0.11 on Monday. Patient without any medical  complaints today, denying chest pain, shortness of breath, nausea, diaphoresis, weakness. Stress test done in February 2018 with EF greater than 65%. Suspect recent episode of shortness of breath likely secondary to high sodium diet. Troponin today is within normal limits. EKG without ischemic changes. Chest x-ray negative. Creatinine slightly elevated from baseline, recommend pt return to prior lower dose of Lasix. Hemodynamically stable, not in distress. Pt with cardiology appt on Thursday of this week. Pt to follow up. Pt is safe for discharge.  Patient discussed with and seen by Dr. Effie Shy, who guided treatment and agrees with care plan for discharge.  Discussed results, findings, treatment and follow up. Patient advised of return precautions. Patient verbalized understanding and agreed with plan.   Final Clinical Impressions(s) / ED Diagnoses   Final diagnoses:  Abnormal laboratory test result    New Prescriptions New Prescriptions   No medications on file     Russo, [place] N, PA-C 10/14/17 Flossie Buffy, MD 10/15/17 1212

## 2017-10-14 NOTE — Discharge Instructions (Signed)
Please read instructions below. Return to taking 60 mg of Lasix per day. Attend your appointment with your cardiologist on Thursday, to follow up on your recent lab results and visit today.  Return to the ER for new or worsening symptoms; including worsening chest pain, shortness of breath, pain that radiates to the arm or neck, pain or shortness of breath worsened with exertion.

## 2017-10-14 NOTE — ED Triage Notes (Signed)
Pt sts was sent here from PCP due to positive troponin. Pt was seen at PCP last week due to increased shob- patient was placed on lasix and lost 6 lbs in one week and has has significant decrease in shob. Pt sts troponin last week was 0.07 and today when it was drawn it was 0.11. Pt denies cp. Dizziness, weakness. sts feels signficantly better since last week.

## 2017-10-14 NOTE — ED Provider Notes (Signed)
  Face-to-face evaluation   History: She presents for evaluation of shortness of breath, which is improving, since she was placed on an increased dose of Lasix.  She has not had any chest pain, during this episode over the last week.  Physical exam: Overweight.  Medical screening examination/treatment/procedure(s) were conducted as a shared visit with non-physician practitioner(s) and myself.  I personally evaluated the patient during the encounter    Mancel BaleWentz, Lailoni Baquera, MD 10/15/17 1212

## 2017-10-14 NOTE — ED Notes (Signed)
PT states understanding of care given, follow up care. PT ambulated from ED to car with a steady gait.  

## 2017-10-16 ENCOUNTER — Encounter: Payer: Self-pay | Admitting: Interventional Cardiology

## 2017-10-16 ENCOUNTER — Ambulatory Visit (INDEPENDENT_AMBULATORY_CARE_PROVIDER_SITE_OTHER): Payer: Medicare Other | Admitting: Interventional Cardiology

## 2017-10-16 ENCOUNTER — Telehealth: Payer: Self-pay | Admitting: Interventional Cardiology

## 2017-10-16 VITALS — BP 138/64 | HR 65 | Ht <= 58 in | Wt 163.1 lb

## 2017-10-16 DIAGNOSIS — E669 Obesity, unspecified: Secondary | ICD-10-CM | POA: Diagnosis not present

## 2017-10-16 DIAGNOSIS — N183 Chronic kidney disease, stage 3 (moderate): Secondary | ICD-10-CM | POA: Diagnosis not present

## 2017-10-16 DIAGNOSIS — I10 Essential (primary) hypertension: Secondary | ICD-10-CM

## 2017-10-16 DIAGNOSIS — I5033 Acute on chronic diastolic (congestive) heart failure: Secondary | ICD-10-CM | POA: Diagnosis not present

## 2017-10-16 DIAGNOSIS — R0602 Shortness of breath: Secondary | ICD-10-CM

## 2017-10-16 DIAGNOSIS — I25709 Atherosclerosis of coronary artery bypass graft(s), unspecified, with unspecified angina pectoris: Secondary | ICD-10-CM

## 2017-10-16 DIAGNOSIS — E1122 Type 2 diabetes mellitus with diabetic chronic kidney disease: Secondary | ICD-10-CM

## 2017-10-16 DIAGNOSIS — E119 Type 2 diabetes mellitus without complications: Secondary | ICD-10-CM

## 2017-10-16 DIAGNOSIS — E1169 Type 2 diabetes mellitus with other specified complication: Secondary | ICD-10-CM | POA: Diagnosis not present

## 2017-10-16 MED ORDER — FUROSEMIDE 80 MG PO TABS: 80.0000 mg | ORAL_TABLET | Freq: Every day | ORAL | 3 refills | Status: DC

## 2017-10-16 MED ORDER — AMLODIPINE BESYLATE 5 MG PO TABS: 5.0000 mg | ORAL_TABLET | Freq: Every day | ORAL | 3 refills | Status: DC

## 2017-10-16 NOTE — Progress Notes (Signed)
Cardiology Office Note    Date:  10/16/2017   ID:  Hailey JonesMarilyn P Levine, DOB 03/13/1946, MRN [id number]  PCP:  Lenell AntuLe, Thao P, DO  Cardiologist: Lesleigh NoeHenry W Vesta Wheeland III, MD   Chief Complaint  Patient presents with  . Coronary Artery Disease    History of Present Illness:  Hailey Levine is a 71 y.o. female with a history of CAD s/p MI and 3v CABG in 2002 with subsequent stenting 2003, HTN, HLD, DM, and CKD stage 3.  Referred back because of concerns related to shortness of breath, elevated BNP greater than 1500, and lower extremity swelling. Earlier this year I started furosemide because of swelling and the concern for diastolic heart failure.diuretic therapy in the form of furosemide was started in May 2017. She recently developed orthopnea and had furosemide transiently increased to 120 mg per day with a 6 pound diuresis and improvement in her breathing. She was sent to the emergency department after blood work done demonstrated a mildly elevated troponin. She was not having chest discomfort. He is here today for further discussion.   Past Medical History:  Diagnosis Date  . Arthritis   . CKD (chronic kidney disease)   . Coronary artery disease   . Depression   . Diabetes mellitus without complication (HCC)   . Hyperlipidemia   . Hypertension   . Myocardial infarction (HCC) 2002   MI x 2 - one prior to 2002    Past Surgical History:  Procedure Laterality Date  . ABDOMINAL HYSTERECTOMY    . CAROTID ENDARTERECTOMY Left 05/21/2001  . CHOLECYSTECTOMY    . CORONARY ANGIOPLASTY WITH STENT PLACEMENT  2003  . CORONARY ARTERY BYPASS GRAFT  2002  . EYE SURGERY Bilateral    cataract  . OVARIAN CYST REMOVAL    . REVERSE SHOULDER ARTHROPLASTY Left 04/05/2014   Procedure: LEFT REVERSE SHOULDER ARTHROPLASTY;  Surgeon: Senaida LangeKevin M Supple, MD;  Location: [facility] OR;  Service: Orthopedics;  Laterality: Left;  . TUBAL LIGATION      Current Medications: Outpatient Medications Prior to Visit    Medication Sig Dispense Refill  . ALPRAZolam (XANAX) 0.25 MG tablet Take 0.25 mg by mouth 3 (three) times daily as needed for anxiety.    Marland Kitchen. amLODipine (NORVASC) 10 MG tablet Take 1 tablet (10 mg total) by mouth daily.  0  . aspirin 81 MG tablet Take 81 mg by mouth daily.    Marland Kitchen. atorvastatin (LIPITOR) 40 MG tablet Take 40 mg by mouth daily.    . carvedilol (COREG) 25 MG tablet Take 25 mg by mouth 2 (two) times daily with a meal.    . cycloSPORINE (RESTASIS) 0.05 % ophthalmic emulsion Place 1 drop into both eyes 2 (two) times daily.    Marland Kitchen. losartan (COZAAR) 100 MG tablet Take 100 mg by mouth daily.    . metFORMIN (GLUCOPHAGE-XR) 500 MG 24 hr tablet Take 1,000 mg by mouth daily.     . nitroGLYCERIN (NITROSTAT) 0.4 MG SL tablet Place 0.4 mg under the tongue every 5 (five) minutes x 3 doses as needed. (chest pain)    . Omega-3 Fatty Acids (OMEGA 3 PO) Take 2 capsules by mouth daily.     . probenecid (BENEMID) 500 MG tablet Take 500 mg by mouth 2 (two) times daily.    . furosemide (LASIX) 40 MG tablet TAKE 1 AND 1/2 TABLETS BY  MOUTH DAILY 135 tablet 1   No facility-administered medications prior to visit.      Allergies:  Metoprolol; Percocet [oxycodone-acetaminophen]; and Benazepril   Social History   Social History  . Marital status: Widowed    Spouse name: N/A  . Number of children: N/A  . Years of education: N/A   Occupational History  . retired    Social History Main Topics  . Smoking status: Former Smoker    Years: 36.00    Quit date: 12/16/1998  . Smokeless tobacco: Never Used  . Alcohol use 0.0 oz/week     Comment: strawberry dacquiri once a year  . Drug use: No  . Sexual activity: Not Asked   Other Topics Concern  . None   Social History Narrative  . None     Family History:  The patient's family history includes Heart attack (age of onset: 21) in her father.   ROS:   Please see the history of present illness.    Dyspnea on exertion, upper respiratory congestion,  nasal congestion, cough, and leg swelling.  All other systems reviewed and are negative.   PHYSICAL EXAM:   VS:  BP 138/64 (BP Location: Right Arm)   Pulse 65   Ht 4\' 10"  (1.473 m)   Wt 163 lb 1.9 oz (74 kg)   BMI 34.09 kg/m    GEN: Well nourished, well developed, in no acute distress  HEENT: normal  Neck: no JVD, carotid bruits, or masses Cardiac: RRR; no murmurs, rubs, or gallops. Trace to 1+ bilateral lower extremity edema. Respiratory:  clear to auscultation bilaterally, normal work of breathing GI: soft, nontender, nondistended, + BS MS: no deformity or atrophy  Skin: warm and dry, no rash Neuro:  Alert and Oriented x 3, Strength and sensation are intact Psych: euthymic mood, full affect  Wt Readings from Last 3 Encounters:  10/16/17 163 lb 1.9 oz (74 kg)  10/14/17 163 lb (73.9 kg)  04/02/17 163 lb 6.4 oz (74.1 kg)      Studies/Labs Reviewed:   EKG:  EKG  Not performed.  Recent Labs: 01/30/2017: TSH 2.955 05/12/2017: ALT 12 10/14/2017: BUN 28; Creatinine, Ser 1.82; Hemoglobin 11.7; Platelets 221; Potassium 4.3; Sodium 133   Lipid Panel    Component Value Date/Time   CHOL 188 05/12/2017 1058   TRIG 153 (H) 05/12/2017 1058   HDL 49 05/12/2017 1058   CHOLHDL 3.8 05/12/2017 1058   CHOLHDL 4.0 01/30/2017 0116   VLDL 17 01/30/2017 0116   LDLCALC 108 (H) 05/12/2017 1058    Additional studies/ records that were reviewed today include:  Chest x-ray performed 10/14/2017 demonstrated cardiomegaly without CHF.    ASSESSMENT:    1. Coronary artery disease involving coronary bypass graft of native heart with angina pectoris (HCC)   2. Acute on chronic diastolic heart failure (HCC)   3. CKD stage 3 due to type 2 diabetes mellitus (HCC)   4. Essential hypertension   5. Diabetes mellitus type 2 in obese (HCC)   6. SOB (shortness of breath)      PLAN:  In order of problems listed above:  1. No symptoms to suggest angina.Relatively recent nuclear scintigraphy did  not demonstrate high risk. 2. Currently present. Increased furosemide 80 mg daily.2-3 week follow-up with basic metabolic panel.BNP will be done also to compare with prior lab work done within the past 7 days when it was greater than 1500. 3. Check kidney function in 2-3 weeks with basic metabolic panel. 4. Lower extremity edema is in part related to 10 mg amlodipine dose. Decrease to 5 mg per day. 5. Not addressed  6. Hopefully shortness of breath will improve with increased furosemide dose.  Reassess in 2-3 weeks with blood work, basic metabolic panel/BNP.    Medication Adjustments/Labs and Tests Ordered: Current medicines are reviewed at length with the patient today.  Concerns regarding medicines are outlined above.  Medication changes, Labs and Tests ordered today are listed in the Patient Instructions below. Patient Instructions  Medication Instructions:  1)INCREASE Furosemide to 80mg  once daily  Labwork: Your physician recommends that you return for lab work in: 2-3 weeks (BMET, Pro BNP- on same day you see APP)   Testing/Procedures: None  Follow-Up: Your physician recommends that you schedule a follow-up appointment in: 2-3 weeks with a PA or NP.    Any Other Special Instructions Will Be Listed Below (If Applicable).     If you need a refill on your cardiac medications before your next appointment, please call your pharmacy.      Signed, Lesleigh Noe, MD  10/16/2017 4:05 PM    [facility] [address] [place], [place], [place]  [address] Phone: [phone]; Fax: [phone]

## 2017-10-16 NOTE — Telephone Encounter (Signed)
Spoke with pt and went over recommendations per Dr. Katrinka BlazingSmith.  Pt verbalized understanding and was in agreement with this plan.  Prescription sent to preferred pharmacy.  Pt will split 10mg  tabs until new prescription arrives,

## 2017-10-16 NOTE — Patient Instructions (Signed)
Medication Instructions:  1)INCREASE Furosemide to 80mg  once daily  Labwork: Your physician recommends that you return for lab work in: 2-3 weeks (BMET, Pro BNP- on same day you see APP)   Testing/Procedures: None  Follow-Up: Your physician recommends that you schedule a follow-up appointment in: 2-3 weeks with a PA or NP.    Any Other Special Instructions Will Be Listed Below (If Applicable).     If you need a refill on your cardiac medications before your next appointment, please call your pharmacy.

## 2017-10-16 NOTE — Telephone Encounter (Signed)
Pt in today to see Dr. Katrinka BlazingSmith.  After pt left, Dr. Katrinka BlazingSmith reviewed chart and decided he wanted pt to decrease Amlodipine to 5mg  QD.  Called pt and left message to call back.

## 2017-11-06 NOTE — Progress Notes (Signed)
Cardiology Office Note   Date:  11/07/2017   ID:  Hailey Levine, DOB 10/21/1946, MRN [id number]  PCP:  Hailey AntuLe, Thao P, DO  Cardiologist:  Dr. Katrinka BlazingSmith     Chief Complaint  Patient presents with  . Congestive Heart Failure    CAD, Heart failure, HTN, Cough       History of Present Illness: Hailey Levine is a 71 y.o. female who presents for CAD and SOB.  She has a history of CAD s/Levine MI and 3v CABG in 2002 with subsequent stenting 2003, HTN, HLD, DM, and CKD stage 3  She was seen by Dr. Katrinka BlazingSmith 10/16/17 for shortness of breath, elevated BNP greater than 1500, and lower extremity swelling. Earlier this year he started furosemide because of swelling and the concern for diastolic heart failure.diuretic therapy in the form of furosemide was started in May 2017. She recently developed orthopnea and had furosemide transiently increased to 120 mg per day with a 6 pound diuresis and improvement in her breathing. She was sent to the emergency department after blood work done demonstrated a mildly elevated troponin though no chest pain. . Relatively recent nuclear scintigraphy did not demonstrate high risk.  Lasix increased 80 daily, amlodipine decreased to 5 mg daily.   needs BMP  BNP.  She actually has been taking the amlodipine aat 10 she had decreased then forgot to decrease this week. Her wt on home scales was down 10-11 lbs, but she ate at [employer] and wt up 3 lbs.  She is not SOB as she was and her edema has improved.  No chest pain.  She complains of cough and post nasal drip.  Would like cough syrup.  It wakes her at night and she cannot get back to sleep.   Past Medical History:  Diagnosis Date  . Arthritis   . CKD (chronic kidney disease)   . Coronary artery disease   . Depression   . Diabetes mellitus without complication (HCC)   . Hyperlipidemia   . Hypertension   . Myocardial infarction (HCC) 2002   MI x 2 - one prior to 2002    Past Surgical History:  Procedure  Laterality Date  . ABDOMINAL HYSTERECTOMY    . CAROTID ENDARTERECTOMY Left 05/21/2001  . CHOLECYSTECTOMY    . CORONARY ANGIOPLASTY WITH STENT PLACEMENT  2003  . CORONARY ARTERY BYPASS GRAFT  2002  . EYE SURGERY Bilateral    cataract  . LEFT REVERSE SHOULDER ARTHROPLASTY Left 04/05/2014   [facility] Sen[facility] LangeSupple[facility] Kevin M[facility]  . OVARIAN CYST REMOVAL    . TUBAL LIGATION       Current Outpatient Medications  Medication Sig Dispense Refill  . ALPRAZolam (XANAX) 0.25 MG tablet Take 0.25 mg by mouth 3 (three) times daily as needed for anxiety.    Marland Kitchen. amLODipine (NORVASC) 5 MG tablet Take 1 tablet (5 mg total) by mouth daily. 90 tablet 3  . aspirin 81 MG tablet Take 81 mg by mouth daily.    Marland Kitchen. atorvastatin (LIPITOR) 40 MG tablet Take 40 mg by mouth daily.    . carvedilol (COREG) 25 MG tablet Take 25 mg by mouth 2 (two) times daily with a meal.    . cycloSPORINE (RESTASIS) 0.05 % ophthalmic emulsion Place 1 drop into both eyes 2 (two) times daily.    . furosemide (LASIX) 80 MG tablet Take 1 tablet (80 mg total) by mouth daily. 90 tablet 3  . losartan (COZAAR) 100  MG tablet Take 100 mg by mouth daily.    . metFORMIN (GLUCOPHAGE-XR) 500 MG 24 hr tablet Take 1,000 mg by mouth daily.     . nitroGLYCERIN (NITROSTAT) 0.4 MG SL tablet Place 0.4 mg under the tongue every 5 (five) minutes x 3 doses as needed. (chest pain)    . Omega-3 Fatty Acids (OMEGA 3 PO) Take 2 capsules by mouth daily.     . probenecid (BENEMID) 500 MG tablet Take 500 mg by mouth 2 (two) times daily.     No current facility-administered medications for this visit.     Allergies:   Metoprolol; Percocet [oxycodone-acetaminophen]; and Benazepril    Social History:  The patient  reports that she quit smoking about 18 years ago. She quit after 36.00 years of use. she has never used smokeless tobacco. She reports that she drinks alcohol. She reports that she does not use drugs.   Family History:  The patient's family history  includes Heart attack (age of onset: 72) in her father.    ROS:  General:no colds or fevers, no weight changes though wt is down 7 lbs at home. Skin:no rashes or ulcers HEENT:no blurred vision, no congestion CV:see HPI PUL:see HPI GI:no diarrhea constipation or melena, no indigestion GU:no hematuria, no dysuria MS:no joint pain, no claudication Neuro:no syncope, no lightheadedness Endo:+ diabetes, no thyroid disease  Wt Readings from Last 3 Encounters:  11/07/17 163 lb 12.8 oz (74.3 kg)  10/16/17 163 lb 1.9 oz (74 kg)  10/14/17 163 lb (73.9 kg)     PHYSICAL EXAM: VS:  BP 140/60   Pulse 67   Resp 16   Ht 4\' 10"  (1.473 m)   Wt 163 lb 12.8 oz (74.3 kg)   SpO2 98%   BMI 34.23 kg/m  , BMI Body mass index is 34.23 kg/m. General:Pleasant affect, NAD Skin:Warm and dry, brisk capillary refill HEENT:normocephalic, sclera clear, mucus membranes moist Neck:supple, no JVD, no bruits  Heart:S1S2 RRR without murmur, gallup, rub or click Lungs:clear without rales, rhonchi, or wheezes BJY:NWGN, non tender, + BS, do not palpate liver spleen or masses Ext:no to trace lower ext edema, 2+ pedal pulses, 2+ radial pulses Neuro:alert and oriented X 3, MAE, follows commands, + facial symmetry    EKG:  EKG is NOT ordered today.    Recent Labs: 01/30/2017: TSH 2.955 05/12/2017: ALT 12 10/14/2017: BUN 28; Creatinine, Ser 1.82; Hemoglobin 11.7; Platelets 221; Potassium 4.3; Sodium 133    Lipid Panel    Component Value Date/Time   CHOL 188 05/12/2017 1058   TRIG 153 (H) 05/12/2017 1058   HDL 49 05/12/2017 1058   CHOLHDL 3.8 05/12/2017 1058   CHOLHDL 4.0 01/30/2017 0116   VLDL 17 01/30/2017 0116   LDLCALC 108 (H) 05/12/2017 1058       Other studies Reviewed: Additional studies/ records that were reviewed today include:.  lexiscan myoview  01/31/17  This is a low risk study.  Defect in the anterior(mid/distal), anteroseptal (distal) and apical wall consistent with probable normal  perfusion and soft tissue attenuation (breast, subcutaneous fat). No significant ischemia or significant scar.  Defect 1: There is a small defect of moderate severity present in the mid anterior, apical anterior, apical septal and apex location.     ASSESSMENT AND PLAN:  1.   Acute on chronic diastolic HF improved on 80 of lasix she has forgotten to decrease the amlodipine for this week.  She will today as she has tr edema in ankles.  She  feels much better, will keep the lasix at 80 mg daily.  Recheck BMP and BNP for SOB  2.  CAD with hx CABG, no chest pain  3.  CKD stage 3 recheck BMP  4.  Essential HTN stable we discussed recall of her losartan and she will contact pharmacy  5.  SOB check BNP ' 6.  DM-2 stable per PCP  She wanted flu shot but we did not have for her age.   Current medicines are reviewed with the patient today.  The patient Has no concerns regarding medicines.  The following changes have been made:  See above Labs/ tests ordered today include:see above  Disposition:   FU:  see above  Signed, Nada BoozerLaura Tanvi Gatling, NP  11/07/2017 11:40 AM    [facility] [address], [place], [place]  [address]/ [address] [place], [place] Phone: [phone]; Fax: [phone]  [phone]

## 2017-11-07 ENCOUNTER — Other Ambulatory Visit: Payer: Medicare Other

## 2017-11-07 ENCOUNTER — Encounter (INDEPENDENT_AMBULATORY_CARE_PROVIDER_SITE_OTHER): Payer: Self-pay

## 2017-11-07 ENCOUNTER — Encounter: Payer: Self-pay | Admitting: Cardiology

## 2017-11-07 ENCOUNTER — Ambulatory Visit: Payer: Medicare Other | Admitting: Cardiology

## 2017-11-07 VITALS — BP 140/60 | HR 67 | Resp 16 | Ht <= 58 in | Wt 163.8 lb

## 2017-11-07 DIAGNOSIS — I38 Endocarditis, valve unspecified: Secondary | ICD-10-CM

## 2017-11-07 DIAGNOSIS — I5032 Chronic diastolic (congestive) heart failure: Secondary | ICD-10-CM

## 2017-11-07 DIAGNOSIS — R0602 Shortness of breath: Secondary | ICD-10-CM

## 2017-11-07 DIAGNOSIS — E1122 Type 2 diabetes mellitus with diabetic chronic kidney disease: Secondary | ICD-10-CM | POA: Diagnosis not present

## 2017-11-07 DIAGNOSIS — E1169 Type 2 diabetes mellitus with other specified complication: Secondary | ICD-10-CM | POA: Diagnosis not present

## 2017-11-07 DIAGNOSIS — I25709 Atherosclerosis of coronary artery bypass graft(s), unspecified, with unspecified angina pectoris: Secondary | ICD-10-CM

## 2017-11-07 DIAGNOSIS — I10 Essential (primary) hypertension: Secondary | ICD-10-CM

## 2017-11-07 DIAGNOSIS — E669 Obesity, unspecified: Secondary | ICD-10-CM

## 2017-11-07 DIAGNOSIS — N183 Chronic kidney disease, stage 3 (moderate): Secondary | ICD-10-CM | POA: Diagnosis not present

## 2017-11-07 DIAGNOSIS — I5033 Acute on chronic diastolic (congestive) heart failure: Secondary | ICD-10-CM

## 2017-11-07 LAB — BASIC METABOLIC PANEL
BUN / CREAT RATIO: 19 (ref 12–28)
BUN: 32 mg/dL — AB (ref 8–27)
CO2: 26 mmol/L (ref 20–29)
Calcium: 10 mg/dL (ref 8.7–10.3)
Chloride: 96 mmol/L (ref 96–106)
Creatinine, Ser: 1.72 mg/dL — ABNORMAL HIGH (ref 0.57–1.00)
GFR calc non Af Amer: 29 mL/min/{1.73_m2} — ABNORMAL LOW (ref >59)
GFR, EST AFRICAN AMERICAN: 34 mL/min/{1.73_m2} — AB (ref >59)
GLUCOSE: 103 mg/dL — AB (ref 65–99)
Potassium: 4.6 mmol/L (ref 3.5–5.2)
SODIUM: 140 mmol/L (ref 134–144)

## 2017-11-07 LAB — PRO B NATRIURETIC PEPTIDE: NT-PRO BNP: 11244 pg/mL — AB (ref 0–301)

## 2017-11-07 MED ORDER — HYDROCODONE-HOMATROPINE 5-1.5 MG/5ML PO SYRP: 5.0000 mL | ORAL_SOLUTION | Freq: Every day | ORAL | 0 refills | Status: DC | PRN

## 2017-11-07 NOTE — Patient Instructions (Addendum)
Medication Instructions:   START TAKING AMLODIPINE 5 MG ONCE A DAY    START TAKING  HYDROMET 120 MG CC 1 TSP EVERY     If you need a refill on your cardiac medications before your next appointment, please call your pharmacy.  Labwork:  BNET AND BNP TODAY    Testing/Procedures:   Follow-Up: WITH HTN CLINIC FOR BLOOD PRESSURE CHECK IN TO 2 TO 3 WEEKS   OR WITH INGOLD   Any Other Special Instructions Will Be Listed Below (If Applicable).  YOU MAY USE CLARITIN

## 2017-11-11 ENCOUNTER — Telehealth: Payer: Self-pay | Admitting: Interventional Cardiology

## 2017-11-11 NOTE — Telephone Encounter (Signed)
Mrs. Hailey Levine is returning your call about lab results . Thanks

## 2017-11-11 NOTE — Telephone Encounter (Signed)
Returned pts call and she has been made aware of her lab results. Pt verbalized understanding.

## 2017-11-11 NOTE — Telephone Encounter (Signed)
-----   Message from Leone BrandLaura R Ingold, NP sent at 11/07/2017  4:57 PM EST ----- Pt had improved edema, her labs are stable continue lasix.

## 2017-11-25 ENCOUNTER — Ambulatory Visit (INDEPENDENT_AMBULATORY_CARE_PROVIDER_SITE_OTHER): Payer: Medicare Other | Admitting: Pharmacist

## 2017-11-25 ENCOUNTER — Encounter: Payer: Self-pay | Admitting: Pharmacist

## 2017-11-25 VITALS — BP 142/64 | HR 72

## 2017-11-25 DIAGNOSIS — I10 Essential (primary) hypertension: Secondary | ICD-10-CM

## 2017-11-25 NOTE — Progress Notes (Signed)
Patient ID: Hailey Levine                 DOB: 03/13/1946                      MRN: [id number]     HPI: Hailey Levine is a 71 y.o. female patient of Dr. Katrinka BlazingSmith who presents today for hypertension evaluation.  PMH significant for CAD s/p MI and 3v CABG in 2002 with subsequent stenting 2003, HTN, HLD, DM, and CKD stage 3. She was recently seen by Nada BoozerLaura Ingold, NP and referred to HTN clinic for BP check after restarting lower dose of amlodipine for edema.   Today she presents and states her legs are much less swollen and she feels much better on her current medication regimen.   She reports that she has actually been taking amlodipine 5mg  daily since before seeing Vernona RiegerLaura (though she reported she was taking 10mg  daily at that visit).   As of the first of next year her insurance will pay for silver sneakers and she will begin a 3x weekly workout regimen. She will participate with her friend in this program.   Current HTN meds:  Furosemide 80mg  daily Amlodipine 5mg  Carvedilol 25mg  BID Losartan 100mg  daily   Previously tried: higher doses of amlodipine - LEE, benazepril  - cough, metoprolol - nausea  BP goal: <130/80  Family History: The patient's family history includes Heart attack (age of onset: 9045) in her father.  Social History: The patient  reports that she quit smoking about 18 years ago. She quit after 36.00 years of use. she has never used smokeless tobacco. She reports that she drinks alcohol. She reports that she does not use drugs.   Diet: Does not add salt to food. She has been buying low sodium foods. Eats out about once a week. She reports she is trying very hard. She has been avoiding fast food and her salty foods like country ham and hot dogs.   Exercise: She is going to [employer] today - she will have insurance pay for silver sneakers starting Jan 1st. She has friend that is going to work out a silver sneakers with her.   Home BP readings:  Wrist cuff - has not  checked recently  Wt Readings from Last 3 Encounters:  11/07/17 163 lb 12.8 oz (74.3 kg)  10/16/17 163 lb 1.9 oz (74 kg)  10/14/17 163 lb (73.9 kg)   BP Readings from Last 3 Encounters:  11/25/17 (!) 142/64  11/07/17 140/60  10/16/17 138/64   Pulse Readings from Last 3 Encounters:  11/25/17 72  11/07/17 67  10/16/17 65    Renal function: CrCl cannot be calculated (Unknown ideal weight.).  Past Medical History:  Diagnosis Date  . Arthritis   . CKD (chronic kidney disease)   . Coronary artery disease   . Depression   . Diabetes mellitus without complication (HCC)   . Hyperlipidemia   . Hypertension   . Myocardial infarction [facility](HCC) 2002   MI x 2 - one prior to 2002    Current Outpatient Medications on File Prior to Visit  Medication Sig Dispense Refill  . ALPRAZolam (XANAX) 0.25 MG tablet Take 0.25 mg by mouth 3 (three) times daily as needed for anxiety.    Marland Kitchen. amLODipine (NORVASC) 5 MG tablet Take 1 tablet (5 mg total) by mouth daily. 90 tablet 3  . aspirin 81 MG tablet Take 81 mg by mouth daily.    .Marland Kitchen  atorvastatin (LIPITOR) 40 MG tablet Take 40 mg by mouth daily.    . carvedilol (COREG) 25 MG tablet Take 25 mg by mouth 2 (two) times daily with a meal.    . cycloSPORINE (RESTASIS) 0.05 % ophthalmic emulsion Place 1 drop into both eyes 2 (two) times daily.    . furosemide (LASIX) 80 MG tablet Take 1 tablet (80 mg total) by mouth daily. 90 tablet 3  . GLIMEPIRIDE PO Take by mouth.    Marland Kitchen. HYDROcodone-homatropine (HYDROMET) 5-1.5 MG/5ML syrup Take 5 mLs daily as needed by mouth for cough. 120 mL 0  . losartan (COZAAR) 100 MG tablet Take 100 mg by mouth daily.    . metFORMIN (GLUCOPHAGE-XR) 500 MG 24 hr tablet Take 1,000 mg by mouth daily.     . Omega-3 Fatty Acids (OMEGA 3 PO) Take 2 capsules by mouth daily.     . probenecid (BENEMID) 500 MG tablet Take 500 mg by mouth 2 (two) times daily.    . nitroGLYCERIN (NITROSTAT) 0.4 MG SL tablet Place 0.4 mg under the tongue every 5  (five) minutes x 3 doses as needed. (chest pain)     No current facility-administered medications on file prior to visit.     Allergies  Allergen Reactions  . Metoprolol Nausea And Vomiting    "Throws up white foamy liquid"  . Percocet [Oxycodone-Acetaminophen] Nausea And Vomiting  . Benazepril Cough    Blood pressure (!) 142/64, pulse 72.   Assessment/Plan: Hypertension: BP today is above goal <130/80. She is reluctant to start another medication today. She would like to try diet and exercise since she is starting silver sneakers for blood pressure control. We discussed medication options, but she deferred these until she has given exercise a trial. We will schedule follow up with her in Feb after she has had 1 full month of her exercise program. She is willing to consider medication changes/additions at that time if pressure still not at goal.    Thank you, Freddie ApleyKelley M. Cleatis PolkaAuten, PharmD  [facility]  11/25/2017 10:57 AM

## 2017-11-25 NOTE — Patient Instructions (Signed)
It was great to meet you today!  Work on exercise and diet!   We will see you next year to check on blood pressure progress with your exercise program.   Call [phone] if you have any questions or concerns.

## 2017-11-27 ENCOUNTER — Telehealth: Payer: Self-pay | Admitting: Pharmacist

## 2017-11-27 NOTE — Telephone Encounter (Signed)
°  New Prob  Wanting to clarify dosage of Glimepiride. States she takes 1 mg.

## 2017-11-27 NOTE — Telephone Encounter (Signed)
Medication information updated for our records.

## 2018-01-08 DIAGNOSIS — Z7984 Long term (current) use of oral hypoglycemic drugs: Secondary | ICD-10-CM | POA: Diagnosis not present

## 2018-01-08 DIAGNOSIS — E119 Type 2 diabetes mellitus without complications: Secondary | ICD-10-CM | POA: Diagnosis not present

## 2018-01-08 DIAGNOSIS — I10 Essential (primary) hypertension: Secondary | ICD-10-CM | POA: Diagnosis not present

## 2018-01-08 DIAGNOSIS — I2510 Atherosclerotic heart disease of native coronary artery without angina pectoris: Secondary | ICD-10-CM | POA: Diagnosis not present

## 2018-01-08 DIAGNOSIS — E782 Mixed hyperlipidemia: Secondary | ICD-10-CM | POA: Diagnosis not present

## 2018-01-13 DIAGNOSIS — J0100 Acute maxillary sinusitis, unspecified: Secondary | ICD-10-CM | POA: Diagnosis not present

## 2018-01-13 DIAGNOSIS — Z7984 Long term (current) use of oral hypoglycemic drugs: Secondary | ICD-10-CM | POA: Diagnosis not present

## 2018-01-13 DIAGNOSIS — I509 Heart failure, unspecified: Secondary | ICD-10-CM | POA: Diagnosis not present

## 2018-01-13 DIAGNOSIS — E119 Type 2 diabetes mellitus without complications: Secondary | ICD-10-CM | POA: Diagnosis not present

## 2018-01-28 DIAGNOSIS — I509 Heart failure, unspecified: Secondary | ICD-10-CM | POA: Diagnosis not present

## 2018-01-28 DIAGNOSIS — R05 Cough: Secondary | ICD-10-CM | POA: Diagnosis not present

## 2018-02-03 ENCOUNTER — Ambulatory Visit (INDEPENDENT_AMBULATORY_CARE_PROVIDER_SITE_OTHER): Payer: Medicare HMO | Admitting: Pharmacist

## 2018-02-03 ENCOUNTER — Encounter: Payer: Self-pay | Admitting: Pharmacist

## 2018-02-03 VITALS — BP 122/62 | HR 65

## 2018-02-03 DIAGNOSIS — I10 Essential (primary) hypertension: Secondary | ICD-10-CM | POA: Diagnosis not present

## 2018-02-03 MED ORDER — AMLODIPINE BESYLATE 5 MG PO TABS: 10.0000 mg | ORAL_TABLET | Freq: Every day | ORAL | 0 refills | Status: DC

## 2018-02-03 NOTE — Patient Instructions (Signed)
Return for a follow up appointment as scheduled with Dr. Katrinka BlazingSmith  Check your blood pressure at home daily (if able) and keep record of the readings.  Take your BP meds as follows:  CONTINUE all as prescribed. Call the clinic if you need anything.   Bring all of your meds, your BP cuff and your record of home blood pressures to your next appointment.  Exercise as you're able, try to walk approximately 30 minutes per day.  Keep salt intake to a minimum, especially watch canned and prepared boxed foods.  Eat more fresh fruits and vegetables and fewer canned items.  Avoid eating in fast food restaurants.    HOW TO TAKE YOUR BLOOD PRESSURE: . Rest 5 minutes before taking your blood pressure. .  Don't smoke or drink caffeinated beverages for at least 30 minutes before. . Take your blood pressure before (not after) you eat. . Sit comfortably with your back supported and both feet on the floor (don't cross your legs). . Elevate your arm to heart level on a table or a desk. . Use the proper sized cuff. It should fit smoothly and snugly around your bare upper arm. There should be enough room to slip a fingertip under the cuff. The bottom edge of the cuff should be 1 inch above the crease of the elbow. . Ideally, take 3 measurements at one sitting and record the average.

## 2018-02-03 NOTE — Progress Notes (Signed)
Patient ID: Hailey Levine                 DOB: 02/13/1946                      MRN: [id number]     HPI: Hailey Levine is a 72 y.o. female patient of Dr. Katrinka BlazingSmith who presents today for hypertension evaluation.  PMH significant for CAD s/p MI and 3v CABG in 2002 with subsequent stenting 2003, HTN, HLD, DM, and CKD stage 3. She previously reported that she was to start silver sneakers at the first of the 2019 year. At her most recent visit in HTN clinic we discussed diet and lifestyle modifications as the patient was hesitant to add another medication due to plan to start exercising.   She presents today for follow up on lifestyle modifications. At last visit she was agreeable to medication changes today if pressures were not controlled after 1 month on current work out program. She reports that she took a cough syrup and it made her SOB and did not help her cough. It also made her constipated and kept her from sleeping. Last dose on 01/28/18 and the symptoms started improving. She was put on albuterol inhaler (she has not used) to help with SOB. She was also referred to a respiratory therapist, but she reports she does not think she needs it as the SOB was related to the medication. She feels much better now that she has stopped it. She does report her cough is still lingering, but she believes this is related to the time of year and a virus or other illness.   She reports that a few weeks ago her PCP called in 10mg  amlodipine (rather than the 5mg  she was previously prescribed) and she was unable to split so she has been taking 10mg  daily. She reports that she has not noticed any change in her legs or swelling (she previously had reported swelling with increased dose of amlodipine) with increased dose of amlodipine.   Current HTN meds:  Furosemide 80mg  daily Amlodipine 10mg  daily  Carvedilol 25mg  BID Losartan 100mg  daily   Previously tried: higher doses of amlodipine - LEE, benazepril  - cough,  metoprolol - nausea  BP goal: <130/80  Family History: The patient's family history includes Heart attack (age of onset: 4045) in her father.  Social History: The patient  reports that she quit smoking about 18 years ago. She quit after 36.00 years of use. she has never used smokeless tobacco. She reports that she drinks alcohol. She reports that she does not use drugs.   Diet: Does not add salt to food. She has been buying low sodium foods. Eats out about once a week. She reports she is trying very hard. She has been avoiding fast food and her salty foods like country ham and hot dogs.   Exercise: She is going to [employer] today - she will have insurance pay for silver sneakers starting Jan 1st. She has friend that is going to work out a silver sneakers with her. She has been doing silver sneakers since our last visit 3-4 times per week.   Home BP readings:  Wrist cuff - she reports all of her home readings have been under 130/80. - per her report no log present today  Wt Readings from Last 3 Encounters:  11/07/17 163 lb 12.8 oz (74.3 kg)  10/16/17 163 lb 1.9 oz (74 kg)  10/14/17 163 lb (  73.9 kg)   BP Readings from Last 3 Encounters:  02/03/18 122/62  11/25/17 (!) 142/64  11/07/17 140/60   Pulse Readings from Last 3 Encounters:  02/03/18 65  11/25/17 72  11/07/17 67    Renal function: CrCl cannot be calculated (Patient's most recent lab result is older than the maximum 21 days allowed.).  Past Medical History:  Diagnosis Date  . Arthritis   . CKD (chronic kidney disease)   . Coronary artery disease   . Depression   . Diabetes mellitus without complication (HCC)   . Hyperlipidemia   . Hypertension   . Myocardial infarction [facility]) 2002   MI x 2 - one prior to 2002    Current Outpatient Medications on File Prior to Visit  Medication Sig Dispense Refill  . aspirin 81 MG tablet Take 81 mg by mouth daily.    Marland Kitchen atorvastatin (LIPITOR) 40 MG tablet Take 40 mg by mouth  daily.    . carvedilol (COREG) 25 MG tablet Take 25 mg by mouth 2 (two) times daily with a meal.    . cycloSPORINE (RESTASIS) 0.05 % ophthalmic emulsion Place 1 drop into both eyes 2 (two) times daily.    . fluticasone (FLONASE) 50 MCG/ACT nasal spray Place 1 spray into both nostrils daily.    . furosemide (LASIX) 80 MG tablet Take 1 tablet (80 mg total) by mouth daily. 90 tablet 3  . GLIMEPIRIDE PO Take 1 mg by mouth daily.    Marland Kitchen losartan (COZAAR) 100 MG tablet Take 100 mg by mouth daily.    . metFORMIN (GLUCOPHAGE-XR) 500 MG 24 hr tablet Take 1,000 mg by mouth daily.     . Omega-3 Fatty Acids (OMEGA 3 PO) Take 2 capsules by mouth daily.     . probenecid (BENEMID) 500 MG tablet Take 500 mg by mouth 2 (two) times daily.    Marland Kitchen ALPRAZolam (XANAX) 0.25 MG tablet Take 0.25 mg by mouth 3 (three) times daily as needed for anxiety.    . nitroGLYCERIN (NITROSTAT) 0.4 MG SL tablet Place 0.4 mg under the tongue every 5 (five) minutes x 3 doses as needed. (chest pain)    . ranitidine (ZANTAC) 150 MG tablet Take 150 mg by mouth at bedtime.     No current facility-administered medications on file prior to visit.     Allergies  Allergen Reactions  . Metoprolol Nausea And Vomiting    "Throws up white foamy liquid"  . Percocet [Oxycodone-Acetaminophen] Nausea And Vomiting  . Codeine     SOB  . Benazepril Cough    Blood pressure 122/62, pulse 65.   Assessment/Plan: Hypertension: BP today is at goal and controlled. Will continue all medications as prescribed including higher dose of amlodipine. Congratulated her on progress with lifestyle modifications including participation in silver sneakers. She will call with any issues. Follow up with Dr. Katrinka Blazing as recommended for April and HTN clinic as needed.    Thank you, Freddie Apley. Cleatis Polka, PharmD  [facility]  02/03/2018 11:52 AM

## 2018-03-02 ENCOUNTER — Other Ambulatory Visit (INDEPENDENT_AMBULATORY_CARE_PROVIDER_SITE_OTHER): Payer: Medicare HMO

## 2018-03-02 ENCOUNTER — Ambulatory Visit: Payer: Medicare HMO | Admitting: Internal Medicine

## 2018-03-02 ENCOUNTER — Encounter: Payer: Self-pay | Admitting: Internal Medicine

## 2018-03-02 VITALS — BP 132/60 | HR 77 | Ht <= 58 in | Wt 159.0 lb

## 2018-03-02 DIAGNOSIS — R0609 Other forms of dyspnea: Secondary | ICD-10-CM

## 2018-03-02 DIAGNOSIS — J45991 Cough variant asthma: Secondary | ICD-10-CM

## 2018-03-02 DIAGNOSIS — R0600 Dyspnea, unspecified: Secondary | ICD-10-CM

## 2018-03-02 LAB — CBC WITH DIFFERENTIAL/PLATELET
BASOS PCT: 0.6 % (ref 0.0–3.0)
Basophils Absolute: 0 10*3/uL (ref 0.0–0.1)
EOS PCT: 0.7 % (ref 0.0–5.0)
Eosinophils Absolute: 0.1 10*3/uL (ref 0.0–0.7)
HCT: 34 % — ABNORMAL LOW (ref 36.0–46.0)
Hemoglobin: 11.3 g/dL — ABNORMAL LOW (ref 12.0–15.0)
LYMPHS ABS: 1.2 10*3/uL (ref 0.7–4.0)
Lymphocytes Relative: 17.4 % (ref 12.0–46.0)
MCHC: 33.2 g/dL (ref 30.0–36.0)
MCV: 92.6 fl (ref 78.0–100.0)
MONOS PCT: 7.4 % (ref 3.0–12.0)
Monocytes Absolute: 0.5 10*3/uL (ref 0.1–1.0)
NEUTROS PCT: 73.9 % (ref 43.0–77.0)
Neutro Abs: 5.2 10*3/uL (ref 1.4–7.7)
PLATELETS: 166 10*3/uL (ref 150.0–400.0)
RBC: 3.68 Mil/uL — ABNORMAL LOW (ref 3.87–5.11)
RDW: 17 % — AB (ref 11.5–15.5)
WBC: 7 10*3/uL (ref 4.0–10.5)

## 2018-03-02 LAB — BASIC METABOLIC PANEL WITH GFR
BUN: 37 mg/dL — ABNORMAL HIGH (ref 6–23)
CO2: 29 meq/L (ref 19–32)
Calcium: 9.8 mg/dL (ref 8.4–10.5)
Chloride: 100 meq/L (ref 96–112)
Creatinine, Ser: 1.94 mg/dL — ABNORMAL HIGH (ref 0.40–1.20)
GFR: 26.85 mL/min — ABNORMAL LOW
Glucose, Bld: 145 mg/dL — ABNORMAL HIGH (ref 70–99)
Potassium: 4.6 meq/L (ref 3.5–5.1)
Sodium: 140 meq/L (ref 135–145)

## 2018-03-02 LAB — NITRIC OXIDE: 11800305105: 20

## 2018-03-02 MED ORDER — BISOPROLOL FUMARATE 5 MG PO TABS: ORAL_TABLET | ORAL | 2 refills | Status: DC

## 2018-03-02 MED ORDER — PREDNISONE 10 MG PO TABS: ORAL_TABLET | ORAL | 0 refills | Status: DC

## 2018-03-02 MED ORDER — PANTOPRAZOLE SODIUM 40 MG PO TBEC: 40.0000 mg | DELAYED_RELEASE_TABLET | Freq: Every day | ORAL | 2 refills | Status: DC

## 2018-03-02 NOTE — Progress Notes (Signed)
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 Consult    Referred by Terri Piedraourtney Forcucci due to cough.  Pt states she has had the cough x4 months. Pt also states she has a lot of SOB. Denies any CP.  onset of cough was insidious/ persistent since Oct 2018 when heat came on and was told by heater repair man she had mold issues in drip pan outdoors(but wanted 1000 to clean it and she declined).  Orthopnea/pnd x since oct 2018 2-330 am every morning, sits up for a few min then back to bed s further complaints      Kouffman Reflux v Neurogenic Cough Differentiator Reflux Comments  Do you awaken from a sound sleep coughing violently?                            With trouble breathing? No yes   Do you have choking episodes when you cannot  Get enough air, gasping for air ?              Some, but better    Do you usually cough when you lie down into  The bed, or when you just lie down to rest ?                          Yes so sleeps on 2 pillows    Do you usually cough after meals or eating?         No    Do you cough when (or after) you bend over?    Makes her wheeze   GERD SCORE     Kouffman Reflux v Neurogenic Cough Differentiator Neurogenic   Do you more-or-less cough all day long? sporadic   Does change of temperature make you cough? Cold air   Does laughing or chuckling cause you to cough? no   Do fumes (perfume, automobile  fumes, burned  Toast, etc.,) cause you to cough ?      no   Does speaking, singing, or talking on the phone cause you to cough   ?               Talking on the phone   Neurogenic/Airway score        No obvious day to day or daytime variability or assoc excess/ purulent sputum or mucus plugs or hemoptysis or cp or chest tightness, subjective wheeze or overt sinus or hb symptoms. No unusual exposure hx or h/o childhood pna/ asthma or knowledge of premature birth.  Sleeping ok flat without nocturnal  or early am exacerbation  of respiratory  c/o's or need for noct saba. Also denies any obvious fluctuation of symptoms with weather or environmental changes or other aggravating or alleviating  factors except as outlined above   Current Allergies, Complete Past Medical History, Past Surgical History, Family History, and Social History were reviewed in [employer] record.  ROS  The following are not active complaints unless bolded Hoarseness, sore throat, dysphagia, dental problems, itching, sneezing,  nasal congestion or discharge of excess mucus or purulent secretions, ear ache,   fever, chills, sweats, unintended wt loss or wt gain, classically pleuritic or exertional cp,  orthopnea pnd or leg swelling, presyncope, palpitations, abdominal pain, anorexia, nausea, vomiting, diarrhea  or change in bowel habits or change in bladder habits, change in stools or change in urine, dysuria, hematuria,  rash, arthralgias, visual complaints, headache, numbness, weakness or ataxia or problems with walking or coordination,  change in mood/affect or memory.        Current Meds  Medication Sig  . albuterol (PROVENTIL HFA;VENTOLIN HFA) 108 (90 Base) MCG/ACT inhaler INHALE 2 PUFFS BY MOUTH EVERY 6 HOURS AS NEEDED  . amLODipine (NORVASC) 5 MG tablet Take 2 tablets (10 mg total) by mouth daily.  Marland Kitchen aspirin 81 MG tablet Take 81 mg by mouth daily.  Marland Kitchen atorvastatin (LIPITOR) 40 MG tablet Take 40 mg by  mouth daily.  . carvedilol (COREG) 25 MG tablet Take 25 mg by mouth 2 (two) times daily with a meal.  . cycloSPORINE (RESTASIS) 0.05 % ophthalmic emulsion Place 1 drop into both eyes 2 (two) times daily.  . fluticasone (FLONASE) 50 MCG/ACT nasal spray Place 1 spray into both nostrils daily.  . furosemide (LASIX) 80 MG tablet Take 80 mg by mouth.  Marland Kitchen GLIMEPIRIDE PO Take 1 mg by mouth daily.  Marland Kitchen losartan (COZAAR) 100 MG tablet Take 100 mg by mouth daily.  . metFORMIN (GLUCOPHAGE-XR) 500 MG 24 hr tablet Take 1,000 mg by mouth daily.   . Omega-3 Fatty Acids (OMEGA 3 PO) Take 2 capsules by mouth daily.   . probenecid (BENEMID) 500 MG tablet Take 500 mg by mouth 2 (two) times daily.           Review of Systems     Objective:   Physical Exam    amb wf nad   Wt Readings from Last 3 Encounters:  03/02/18 159 lb (72.1 kg)  11/07/17 163 lb 12.8 oz (74.3 kg)  10/16/17 163 lb 1.9 oz (74 kg)     Vital signs reviewed - Note on arrival 02 sats  94% on RA      HEENT: nl dentition, turbinates bilaterally, and oropharynx. Nl external ear canals without cough reflex   NECK :  without JVD/Nodes/TM/ nl carotid upstrokes bilaterally   LUNGS: no acc muscle use,  Nl contour chest which is clear to A and P bilaterally without cough on insp or exp maneuvers   CV:  RRR  no s3 or murmur or increase in P2, and 1+ pedal edema bilaterally   ABD:  soft and nontender with nl inspiratory excursion in the supine position. No bruits or organomegaly appreciated, bowel sounds nl  MS:  Nl gait/ ext warm without deformities, calf tenderness, cyanosis or clubbing No obvious joint restrictions   SKIN: warm and dry without lesions    NEURO:  alert, approp, nl sensorium with  no motor or cerebellar deficits apparent.      Done Feb 2019 nl per courtney    Labs ordered/ reviewed:      Chemistry      Component Value Date/Time   NA 140 03/02/2018 1724   NA 140 11/07/2017  1214   K 4.6 03/02/2018  1724   CL 100 03/02/2018 1724   CO2 29 03/02/2018 1724   BUN 37 (H) 03/02/2018 1724   BUN 32 (H) 11/07/2017 1214   CREATININE 1.94 (H) 03/02/2018 1724   CREATININE 1.65 (H) 05/17/2016 1356      Component Value Date/Time   CALCIUM 9.8 03/02/2018 1724   ALKPHOS 71 05/12/2017 1058   AST 22 05/12/2017 1058   ALT 12 05/12/2017 1058   BILITOT 0.3 05/12/2017 1058        Lab Results  Component Value Date   WBC 7.0 03/02/2018   HGB 11.3 (L) 03/02/2018   HCT 34.0 (L) 03/02/2018   MCV 92.6 03/02/2018   PLT 166.0 03/02/2018       EOS                       0.1                                                                   03/02/2018     Lab Results  Component Value Date   TSH 2.955 01/30/2017     Lab Results  Component Value Date   PROBNP 2,115.0 (H) 03/02/2018      Labs ordered 03/02/2018  Allergy profile   Assessment:

## 2018-03-02 NOTE — Patient Instructions (Addendum)
Stop coreg(corevidol)  If you are on it  and start bisoprolol 5 mg twice daily in its place but call me first to clarify   Prednisone 10 mg take  4 each am x 2 days,   2 each am x 2 days,  1 each am x 2 days and stop   Add protonix Take 30-60 min before first meal of the day to zantac 150 mg  at bedtime   GERD (REFLUX)  is an extremely common cause of respiratory symptoms just like yours , many times with no obvious heartburn at all.    It can be treated with medication, but also with lifestyle changes including elevation of the head of your bed (ideally with 6 inch  bed blocks),  Smoking cessation, avoidance of late meals, excessive alcohol, and avoid fatty foods, chocolate, peppermint, colas, red wine, and acidic juices such as orange juice.  NO MINT OR MENTHOL PRODUCTS SO NO COUGH DROPS  USE SUGARLESS CANDY INSTEAD (Jolley ranchers or Stover's or Life Savers) or even ice chips will also do - the key is to swallow to prevent all throat clearing. NO OIL BASED VITAMINS - use powdered substitutes.     Please remember to go to the lab department downstairs in the basement  for your tests - we will call you with the results when they are available.      Please schedule a follow up office visit in 4 weeks, sooner if needed  with all medications /inhalers/ solutions in hand so we can verify exactly what you are taking. This includes all medications from all doctors and over the counters

## 2018-03-03 ENCOUNTER — Encounter: Payer: Self-pay | Admitting: Internal Medicine

## 2018-03-03 ENCOUNTER — Other Ambulatory Visit: Payer: Self-pay | Admitting: Internal Medicine

## 2018-03-03 ENCOUNTER — Telehealth: Payer: Self-pay | Admitting: Internal Medicine

## 2018-03-03 DIAGNOSIS — R0600 Dyspnea, unspecified: Secondary | ICD-10-CM

## 2018-03-03 DIAGNOSIS — R0609 Other forms of dyspnea: Principal | ICD-10-CM

## 2018-03-03 LAB — RESPIRATORY ALLERGY PROFILE REGION II ~~LOC~~
ALLERGEN, D PTERNOYSSINUS, D1: 0.21 kU/L — AB
Allergen, Cedar tree, t12: <0.1 kU/L
Allergen, Cottonwood, t14: <0.1 kU/L
Allergen, Mouse Urine Protein, e78: <0.1 kU/L
Allergen, Mulberry, t76: <0.1 kU/L
Allergen, Oak,t7: <0.1 kU/L
Aspergillus fumigatus, m3: <0.1 kU/L
Bermuda Grass: <0.1 kU/L
CLADOSPORIUM HERBARUM (M2) IGE: <0.1 kU/L
CLASS: 0
CLASS: 0
CLASS: 0
CLASS: 0
CLASS: 0
CLASS: 0
CLASS: 0
CLASS: 0
CLASS: 0
CLASS: 0
COMMON RAGWEED (SHORT) (W1) IGE: <0.1 kU/L
Cat Dander: <0.1 kU/L
Class: 0
Class: 0
Class: 0
Class: 0
Class: 0
Class: 0
Class: 0
Class: 0
Class: 0
Class: 0
Class: 0
Class: 0
Class: 0
Class: 0
Cockroach: 0.15 kU/L — ABNORMAL HIGH
D. FARINAE: 0.17 kU/L — AB
Dog Dander: <0.1 kU/L
Elm IgE: <0.1 kU/L
IGE (IMMUNOGLOBULIN E), SERUM: 251 kU/L — AB (ref <=114)
Johnson Grass: <0.1 kU/L
Rough Pigweed  IgE: <0.1 kU/L
Sheep Sorrel IgE: <0.1 kU/L
Timothy Grass: <0.1 kU/L

## 2018-03-03 LAB — INTERPRETATION:

## 2018-03-03 LAB — BRAIN NATRIURETIC PEPTIDE: Pro B Natriuretic peptide (BNP): 2115 pg/mL — ABNORMAL HIGH (ref 0.0–100.0)

## 2018-03-03 NOTE — Assessment & Plan Note (Signed)
Symptoms are markedly disproportionate to objective findings and not clear to what extent this is actually a pulmonary  problem but pt does appear to have difficult to sort out respiratory symptoms of unknown origin for which  DDX  = almost all start with A and  include Adherence, Ace Inhibitors, Acid Reflux, Active Sinus Disease, Alpha 1 Antitripsin deficiency, Anxiety masquerading as Airways dz,  ABPA,  Allergy(esp in young), Aspiration (esp in elderly), Adverse effects of meds,  Active smokers, A bunch of PE's/clot burden (a few small clots can't cause this syndrome unless there is already severe underlying pulm or vascular dz with poor reserve),  Anemia or thyroid disorder, plus two Bs  = Bronchiectasis and Beta blocker use..and one C= CHF     Adherence is always the initial "prime suspect" and is a multilayered concern that requires a "trust but verify" approach in every patient - starting with knowing how to use medications, especially inhalers, correctly, keeping up with refills and understanding the fundamental difference between maintenance and prns vs those medications only taken for a very short course and then stopped and not refilled.  - easily confused with meds so return with all meds in hand using a trust but verify approach to confirm accurate Medication  Reconciliation The principal here is that until we are certain that the  patients are doing what we've asked, it makes no sense to ask them to do more.    ? Acid (or non-acid) GERD > always difficult to exclude as up to 75% of pts in some series report no assoc GI/ Heartburn symptoms> rec max (24h)  acid suppression and diet restrictions/ reviewed and instructions given in writing.   ? Allergy/ asthma > send profile/ continue flonase   ? ACEi effects For reasons that may related to vascular permability and nitric oxide pathways but not elevated  bradykinin levels (as seen with  ACEi use), although quite rare,  losartan in the generic  form has been reported now from mulitple sources  to cause a similar pattern of non-specific  upper airway symptoms as seen with acei.   This has not been reported with exposure to the other ARB's to date, so may be necessary  to try either generic diovan or avapro if ARB needed or use an alternative class altogether.  See:  Dewayne HatchAnn Allergy Asthma Immunol  2008: 101: p [phone]     ? Adverse drug effects > none listed but she's not real sure of names  ? Beta blocker >  She's not even sure she's on coreg in listed doses but  In the setting of respiratory symptoms of unknown etiology,  It would be preferable to use bystolic, the most beta -1  selective Beta blocker available in sample form, with bisoprolol the most selective generic choice  on the market, at least on a trial basis, to make sure the spillover Beta 2 effects of the less specific Beta blockers are not contributing to this patient's symptoms.   Try bisoprolol 5 mg bid to substitute if she is presently on coreg 25 bid as listed   ? Chf/ cardiac asthma suggested by 3 am  pnd assoc with cri/ vol overload  >  No echo on record so rec asap

## 2018-03-03 NOTE — Telephone Encounter (Signed)
As long as she has it with a meal she should be fine. Should not be taken as a bedside snack

## 2018-03-03 NOTE — Progress Notes (Signed)
Spoke with pt and notified of results per Dr. Sherene SiresWert. Pt verbalized understanding and denied any questions. She does not recall ever having ECHO. [facility]

## 2018-03-03 NOTE — Telephone Encounter (Signed)
Spoke with pt. She is aware of Dr. Wert's response. Nothing further was needed. 

## 2018-03-03 NOTE — Telephone Encounter (Signed)
Notes recorded by Nyoka CowdenWert, Michael B, MD on 03/03/2018 at 10:51 AM EDT Call patient : Studies are c/w too high a pressure in her heart which may be causing some of her symptoms so needs echo if none done in last 6 months or copy of last study to sort out this problem and I will be sending report to pcp as well but no change in meds for now  Advised pt of results. Pt understood.   Spoke with pt she was told not to eat acidic foods but she wants to know if she can have one orange a day?

## 2018-03-03 NOTE — Assessment & Plan Note (Signed)
FENO 03/02/2018  =   18   FENO is unimpressive for asthma and prev rx with nasal steroids didn't help much but apparently has never tried systemic rx even short term so rec Prednisone 10 mg take  4 each am x 2 days,   2 each am x 2 days,  1 each am x 2 days and stop and rx for gerd and ? Cardiac asthma as per doe a/p

## 2018-03-04 NOTE — Progress Notes (Signed)
LMTCB

## 2018-03-10 ENCOUNTER — Ambulatory Visit (HOSPITAL_COMMUNITY): Payer: Medicare HMO | Attending: Cardiology

## 2018-03-10 ENCOUNTER — Other Ambulatory Visit: Payer: Self-pay

## 2018-03-10 ENCOUNTER — Telehealth: Payer: Self-pay | Admitting: Internal Medicine

## 2018-03-10 DIAGNOSIS — E785 Hyperlipidemia, unspecified: Secondary | ICD-10-CM | POA: Insufficient documentation

## 2018-03-10 DIAGNOSIS — I081 Rheumatic disorders of both mitral and tricuspid valves: Secondary | ICD-10-CM | POA: Diagnosis not present

## 2018-03-10 DIAGNOSIS — I129 Hypertensive chronic kidney disease with stage 1 through stage 4 chronic kidney disease, or unspecified chronic kidney disease: Secondary | ICD-10-CM | POA: Diagnosis not present

## 2018-03-10 DIAGNOSIS — E1122 Type 2 diabetes mellitus with diabetic chronic kidney disease: Secondary | ICD-10-CM | POA: Insufficient documentation

## 2018-03-10 DIAGNOSIS — Z8249 Family history of ischemic heart disease and other diseases of the circulatory system: Secondary | ICD-10-CM | POA: Diagnosis not present

## 2018-03-10 DIAGNOSIS — I2510 Atherosclerotic heart disease of native coronary artery without angina pectoris: Secondary | ICD-10-CM | POA: Insufficient documentation

## 2018-03-10 DIAGNOSIS — R0600 Dyspnea, unspecified: Secondary | ICD-10-CM

## 2018-03-10 DIAGNOSIS — R0609 Other forms of dyspnea: Secondary | ICD-10-CM | POA: Diagnosis not present

## 2018-03-10 DIAGNOSIS — Z87891 Personal history of nicotine dependence: Secondary | ICD-10-CM | POA: Diagnosis not present

## 2018-03-10 DIAGNOSIS — I252 Old myocardial infarction: Secondary | ICD-10-CM | POA: Insufficient documentation

## 2018-03-10 DIAGNOSIS — N189 Chronic kidney disease, unspecified: Secondary | ICD-10-CM | POA: Insufficient documentation

## 2018-03-10 MED ORDER — PERFLUTREN LIPID MICROSPHERE
1.0000 mL | INTRAVENOUS | Status: AC | PRN
  Administered 2018-03-10: 2 mL via INTRAVENOUS

## 2018-03-10 NOTE — Telephone Encounter (Signed)
Result Notes for ECHOCARDIOGRAM COMPLETE   Notes recorded by Vallery Saox, Heather C, CMA on 03/10/2018 at 4:28 PM EDT Called and spoke with patient she states that she will call her cardiologist to see him. Patient verbalized understanding and nothing further is needed. ------

## 2018-03-11 ENCOUNTER — Telehealth: Payer: Self-pay | Admitting: Interventional Cardiology

## 2018-03-11 NOTE — Telephone Encounter (Signed)
Spoke with pt and she states that since Nov or Dec she has been waking up in the middle of the night SOB and unable to go back to bed.  Pt states feels like her nose is congested.  Uses warm cloth and is able to open nose back up.  Pt seen PCP and was referred to pulm for suspicion of asthma or COPD.  Dr. Sherene SiresWert ordered an echo and pt's EF noted to be 20-25%.  They wanted pt to see cardiology.  Pt scheduled for 5/2.  Rescheduled pt to see Dr. Katrinka BlazingSmith 3/22. Advised we could discuss work out at [employer] at time of appt.  Pt verbalized understanding and was appreciative for call.

## 2018-03-11 NOTE — Telephone Encounter (Signed)
New Message   Pt is calling because she goes to [employer] [facility] times a week, works out for 2-3 hours and she wants to make sure its ok for her to do so. Also wakes up in the middle of the night, cant breath and cant go back to sleep. Please call Will not be home until 3pm

## 2018-03-12 DIAGNOSIS — I5022 Chronic systolic (congestive) heart failure: Secondary | ICD-10-CM | POA: Insufficient documentation

## 2018-03-12 NOTE — Progress Notes (Signed)
Cardiology Office Note    Date:  03/13/2018   ID:  [name], DOB 02-09-1946, MRN [id number]  PCP:  Lenell Antu, DO  Cardiologist: Lesleigh Noe, MD   Chief Complaint  Patient presents with  . Shortness of Breath  . Congestive Heart Failure    History of Present Illness:  Hailey Levine is a 72 y.o. female with a history of CAD s/p MI and 3v CABG in 2002 with subsequent stenting 2003, HTN, HLD, DM, and CKD stage 3.  Since November, Hailey Levine has suffered with orthopnea and cough.  She has had difficulty breathing.  She saw Dr. Elesa Massed who is gone through a series of manipulations culminating in the great decision to perform an echocardiogram which demonstrated severe systolic heart failure.  Recent echo demonstrated EF 25%.  Last EF assessment was at the time of a myocardial perfusion imaging study when in February 2018 when the wall motion study estimated an EF of 81%.  In February she had an episode for which she awakened, was diaphoretic, and felt as though she were smothering.  She took 3 nitroglycerin tablets, 4 aspirin, and after 20 minutes began feeling better.  She did not report this.  There was no associated discomfort of any type.  The coronary history is that of an anterior infarction in 2002 followed by coronary artery bypass grafting.  Blood pressure has been difficult to control over the years.  Past Medical History:  Diagnosis Date  . Arthritis   . CKD (chronic kidney disease)   . Coronary artery disease   . Depression   . Diabetes mellitus without complication (HCC)   . Hyperlipidemia   . Hypertension   . Myocardial infarction (HCC) 2002   MI x 2 - one prior to 2002    Past Surgical History:  Procedure Laterality Date  . ABDOMINAL HYSTERECTOMY    . CAROTID ENDARTERECTOMY Left 05/21/2001  . CHOLECYSTECTOMY    . CORONARY ANGIOPLASTY WITH STENT PLACEMENT  2003  . CORONARY ARTERY BYPASS GRAFT  2002  . EYE SURGERY Bilateral    cataract  . OVARIAN  CYST REMOVAL    . REVERSE SHOULDER ARTHROPLASTY Left 04/05/2014   Procedure: LEFT REVERSE SHOULDER ARTHROPLASTY;  Surgeon: Senaida Lange, MD;  Location: [facility] OR;  Service: Orthopedics;  Laterality: Left;  . TUBAL LIGATION      Current Medications: Outpatient Medications Prior to Visit  Medication Sig Dispense Refill  . albuterol (PROVENTIL HFA;VENTOLIN HFA) 108 (90 Base) MCG/ACT inhaler INHALE 2 PUFFS BY MOUTH EVERY 6 HOURS AS NEEDED  0  . aspirin 81 MG tablet Take 81 mg by mouth daily.    Marland Kitchen atorvastatin (LIPITOR) 40 MG tablet Take 40 mg by mouth daily.    . bisoprolol (ZEBETA) 5 MG tablet One twice daily 60 tablet 2  . cycloSPORINE (RESTASIS) 0.05 % ophthalmic emulsion Place 1 drop into both eyes 2 (two) times daily.    Marland Kitchen GLIMEPIRIDE PO Take 1 mg by mouth daily.    . metFORMIN (GLUCOPHAGE-XR) 500 MG 24 hr tablet Take 1,000 mg by mouth daily.     . nitroGLYCERIN (NITROSTAT) 0.4 MG SL tablet Place 0.4 mg under the tongue every 5 (five) minutes x 3 doses as needed. (chest pain)    . Omega-3 Fatty Acids (OMEGA 3 PO) Take 2 capsules by mouth daily.     . pantoprazole (PROTONIX) 40 MG tablet Take 1 tablet (40 mg total) by mouth daily. Take 30-60 min before  first meal of the day 30 tablet 2  . predniSONE (DELTASONE) 10 MG tablet Take  4 each am x 2 days,   2 each am x 2 days,  1 each am x 2 days and stop 14 tablet 0  . probenecid (BENEMID) 500 MG tablet Take 500 mg by mouth 2 (two) times daily.    . ranitidine (ZANTAC) 150 MG tablet Take 150 mg by mouth at bedtime.    Marland Kitchen. amLODipine (NORVASC) 5 MG tablet Take 2 tablets (10 mg total) by mouth daily. 90 tablet 0  . furosemide (LASIX) 80 MG tablet Take 80 mg by mouth daily.    Marland Kitchen. losartan (COZAAR) 100 MG tablet Take 100 mg by mouth daily.    . fluticasone (FLONASE) 50 MCG/ACT nasal spray Place 1 spray into both nostrils daily.    . furosemide (LASIX) 80 MG tablet Take 1 tablet (80 mg total) by mouth daily. 90 tablet 3  . furosemide (LASIX) 80 MG tablet  Take 80 mg by mouth.     No facility-administered medications prior to visit.      Allergies:   Metoprolol; Percocet [oxycodone-acetaminophen]; Codeine; and Benazepril   Social History   Socioeconomic History  . Marital status: Widowed    Spouse name: Not on file  . Number of children: Not on file  . Years of education: Not on file  . Highest education level: Not on file  Occupational History  . Occupation: retired  Engineer, productionocial Needs  . Financial resource strain: Not on file  . Food insecurity:    Worry: Not on file    Inability: Not on file  . Transportation needs:    Medical: Not on file    Non-medical: Not on file  Tobacco Use  . Smoking status: Former Smoker    Packs/day: 0.75    Years: 36.00    Pack years: 27.00    Types: Cigarettes    Last attempt to quit: 12/16/1998    Years since quitting: 19.2  . Smokeless tobacco: Never Used  Substance and Sexual Activity  . Alcohol use: Yes    Alcohol/week: 0.0 oz    Comment: strawberry dacquiri once a year  . Drug use: No  . Sexual activity: Not on file  Lifestyle  . Physical activity:    Days per week: Not on file    Minutes per session: Not on file  . Stress: Not on file  Relationships  . Social connections:    Talks on phone: Not on file    Gets together: Not on file    Attends religious service: Not on file    Active member of club or organization: Not on file    Attends meetings of clubs or organizations: Not on file    Relationship status: Not on file  Other Topics Concern  . Not on file  Social History Narrative  . Not on file     Family History:  The patient's family history includes Heart attack (age of onset: 3245) in her father.   ROS:   Please see the history of present illness.    Cough, difficulty sleeping, wheezing, PND, leg swelling, fatigue, and chills. All other systems reviewed and are negative.   PHYSICAL EXAM:   VS:  BP (!) 142/76   Pulse 71   Ht 4' 9.5" (1.461 m)   Wt 158 lb 6.4 oz  (71.8 kg)   BMI 33.68 kg/m    GEN: Well nourished, well developed, in no acute distress  HEENT: normal  Neck: no JVD, carotid bruits, or masses Cardiac: RRR; no murmurs, rubs, or gallops,no edema  Respiratory:  clear to auscultation bilaterally, normal work of breathing GI: soft, nontender, nondistended, + BS MS: no deformity or atrophy  Skin: warm and dry, no rash Neuro:  Alert and Oriented x 3, Strength and sensation are intact Psych: euthymic mood, full affect  Wt Readings from Last 3 Encounters:  03/13/18 158 lb 6.4 oz (71.8 kg)  03/02/18 159 lb (72.1 kg)  11/07/17 163 lb 12.8 oz (74.3 kg)      Studies/Labs Reviewed:   EKG:  EKG sinus rhythm with poor R wave progression and left axis deviation.  No change when compared with historical EKGs dating back several years.  Recent Labs: 05/12/2017: ALT 12 03/02/2018: BUN 37; Creatinine, Ser 1.94; Hemoglobin 11.3; Platelets 166.0; Potassium 4.6; Pro B Natriuretic peptide (BNP) 2,115.0; Sodium 140   Lipid Panel    Component Value Date/Time   CHOL 188 05/12/2017 1058   TRIG 153 (H) 05/12/2017 1058   HDL 49 05/12/2017 1058   CHOLHDL 3.8 05/12/2017 1058   CHOLHDL 4.0 01/30/2017 0116   VLDL 17 01/30/2017 0116   LDLCALC 108 (H) 05/12/2017 1058    Additional studies/ records that were reviewed today include:  2 D Echocardiogram 02/2018: Study Conclusions   - Left ventricle: The cavity size was normal. Systolic function was   severely reduced. The estimated ejection fraction was in the   range of 20% to 25%. Wall motion was normal; there were no   regional wall motion abnormalities. Features are consistent with   a pseudonormal left ventricular filling pattern, with concomitant   abnormal relaxation and increased filling pressure (grade 2   diastolic dysfunction). Doppler parameters are consistent with   elevated ventricular end-diastolic filling pressure. - Mitral valve: There was mild regurgitation. - Left atrium: The atrium  was mildly dilated. - Right ventricle: The cavity size was moderately dilated. Wall   thickness was normal. Systolic function was severely reduced. - Tricuspid valve: There was moderate regurgitation. - Pulmonary arteries: Systolic pressure was moderately increased.   PA peak pressure: 60 mm Hg (S).   Impressions:   - There is severe biventricular systolic dysfunction. 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 No thrombus is seen on Definity   echocontrast images.  Nuclear stress perfusion 01/2017:  This is a low risk study.  Defect in the anterior(mid/distal), anteroseptal (distal) and apical wall consistent with probable normal perfusion and soft tissue attenuation (breast, subcutaneous fat). No significant ischemia or significant scar.  Defect 1: There is a small defect of moderate severity present in the mid anterior, apical anterior, apical septal and apex location  LV hyperdynamic with EF 65%.   ASSESSMENT:    1. Coronary artery disease involving coronary bypass graft of native heart with angina pectoris (HCC)   2. Chronic systolic heart failure (HCC)   3. CKD stage 3 due to type 2 diabetes mellitus (HCC)   4. Essential hypertension   5. Mixed hyperlipidemia      PLAN:  In order of problems listed above:  1. Suspect bypass graft failure as a cause for the sudden decrease in the patient's LV function over the past 14 months.  There are no symptoms to suggest angina other than dyspnea which is likely her anginal equivalent. 2. Medication adjustments are  being made to get the patient on a guideline directed regimen for preservation of LV function and control of heart failure.  Therefore losartan has been discontinued and Entresto 24/26 mg twice daily has been started.  She will have one week follow-up with APP at which time Entresto dose should be  increased to 49/51 mg twice daily.  If blood pressure allows, we should consider adding long-acting nitrates, Imdur 30 over 60 mg/day.  Amlodipine is discontinued which should allow [place] to have additional room for up titration of heart failure therapy.  Furosemide is increased to a total of 120 mg/day.  On return in 7 days the patient will need to have a basic metabolic panel and BNP performed.  Goal is to stabilize the patient's heart failure and perform coronary angiography to identify the presence or absence of bypass graft failure.  She is on bisoprolol currently, which can be used in the setting of heart failure.  Current dose has not been changed. 3. Kidney function will be reassessed on next visit with the medication changes that have been made today. 4. Blood pressure target 130/80.  On her current medical regimen this is not being achieved.  Further up titration of Entresto will help control as well more aggressive diuresis.  Kidney function will need to be watched closely.  Clinical follow-up in 1 week with APP.  Indication for the follow-up is to monitor kidney function, uptitrate heart failure therapy (Entresto if possible and Imdur should be added if possible).  Based upon kidney function further diuresis may be a consideration, perhaps furosemide 80 mg twice daily.  I need to see the patient in 3-5 weeks at which time hopefully she will be stable enough to set up coronary angiography.    Medication Adjustments/Labs and Tests Ordered: Current medicines are reviewed at length with the patient today.  Concerns regarding medicines are outlined above.  Medication changes, Labs and Tests ordered today are listed in the Patient Instructions below. Patient Instructions  Medication Instructions:  1) STOP Losartan  2) STOP Amlodipine 3) START Entresto 24/26mg  twice daily 4) INCREASE Furosemide to 80mg  in the morning and 40mg  in the evening.   Labwork: You will have a BMET and Pro BNP when you  come back to see Vernona Rieger in one week  Testing/Procedures: None  Follow-Up: Your physician recommends that you schedule a follow-up appointment in: 1 week with Nada Boozer, NP.   Any Other Special Instructions Will Be Listed Below (If Applicable).     If you need a refill on your cardiac medications before your next appointment, please call your pharmacy.      Signed, Lesleigh Noe, MD  03/13/2018 11:28 AM    [facility] [address] [place], [place], [place]  [address] Phone: [phone]; Fax: [phone]

## 2018-03-13 ENCOUNTER — Ambulatory Visit: Payer: Medicare HMO | Admitting: Interventional Cardiology

## 2018-03-13 ENCOUNTER — Encounter: Payer: Self-pay | Admitting: Interventional Cardiology

## 2018-03-13 VITALS — BP 142/76 | HR 71 | Ht <= 58 in | Wt 158.4 lb

## 2018-03-13 DIAGNOSIS — N183 Chronic kidney disease, stage 3 unspecified: Secondary | ICD-10-CM

## 2018-03-13 DIAGNOSIS — I10 Essential (primary) hypertension: Secondary | ICD-10-CM | POA: Diagnosis not present

## 2018-03-13 DIAGNOSIS — E782 Mixed hyperlipidemia: Secondary | ICD-10-CM | POA: Diagnosis not present

## 2018-03-13 DIAGNOSIS — I5022 Chronic systolic (congestive) heart failure: Secondary | ICD-10-CM

## 2018-03-13 DIAGNOSIS — E1122 Type 2 diabetes mellitus with diabetic chronic kidney disease: Secondary | ICD-10-CM | POA: Diagnosis not present

## 2018-03-13 DIAGNOSIS — I25709 Atherosclerosis of coronary artery bypass graft(s), unspecified, with unspecified angina pectoris: Secondary | ICD-10-CM | POA: Diagnosis not present

## 2018-03-13 MED ORDER — FUROSEMIDE 80 MG PO TABS: ORAL_TABLET | ORAL | 1 refills | Status: DC

## 2018-03-13 MED ORDER — SACUBITRIL-VALSARTAN 24-26 MG PO TABS: 1.0000 | ORAL_TABLET | Freq: Two times a day (BID) | ORAL | 3 refills | Status: DC

## 2018-03-13 NOTE — Patient Instructions (Addendum)
Medication Instructions:  1) STOP Losartan  2) STOP Amlodipine 3) START Entresto 24/26mg  twice daily 4) INCREASE Furosemide to 80mg  in the morning and 40mg  in the evening.   Labwork: You will have a BMET and Pro BNP when you come back to see Vernona RiegerLaura in one week  Testing/Procedures: None  Follow-Up: Your physician recommends that you schedule a follow-up appointment in: 1 week with Nada BoozerLaura Ingold, NP.   Any Other Special Instructions Will Be Listed Below (If Applicable).     If you need a refill on your cardiac medications before your next appointment, please call your pharmacy.

## 2018-03-20 ENCOUNTER — Other Ambulatory Visit: Payer: Self-pay

## 2018-03-20 ENCOUNTER — Encounter: Payer: Self-pay | Admitting: Cardiology

## 2018-03-20 ENCOUNTER — Emergency Department (HOSPITAL_COMMUNITY): Payer: Medicare HMO

## 2018-03-20 ENCOUNTER — Other Ambulatory Visit: Payer: Medicare HMO | Admitting: *Deleted

## 2018-03-20 ENCOUNTER — Encounter: Payer: Self-pay | Admitting: *Deleted

## 2018-03-20 ENCOUNTER — Encounter (HOSPITAL_COMMUNITY): Payer: Self-pay | Admitting: Emergency Medicine

## 2018-03-20 ENCOUNTER — Ambulatory Visit: Payer: Medicare HMO | Admitting: Cardiology

## 2018-03-20 ENCOUNTER — Inpatient Hospital Stay (HOSPITAL_COMMUNITY)
Admission: EM | Admit: 2018-03-20 | Discharge: 2018-04-06 | DRG: 252 | Disposition: A | Discharge status: Hospice | Payer: Medicare HMO | Attending: Vascular Surgery | Admitting: Vascular Surgery

## 2018-03-20 VITALS — BP 130/80 | HR 70 | Ht <= 58 in | Wt 159.0 lb

## 2018-03-20 DIAGNOSIS — Z66 Do not resuscitate: Secondary | ICD-10-CM | POA: Diagnosis not present

## 2018-03-20 DIAGNOSIS — I5043 Acute on chronic combined systolic (congestive) and diastolic (congestive) heart failure: Secondary | ICD-10-CM | POA: Diagnosis present

## 2018-03-20 DIAGNOSIS — M79609 Pain in unspecified limb: Secondary | ICD-10-CM | POA: Diagnosis not present

## 2018-03-20 DIAGNOSIS — N179 Acute kidney failure, unspecified: Secondary | ICD-10-CM | POA: Diagnosis not present

## 2018-03-20 DIAGNOSIS — R34 Anuria and oliguria: Secondary | ICD-10-CM | POA: Diagnosis not present

## 2018-03-20 DIAGNOSIS — I25709 Atherosclerosis of coronary artery bypass graft(s), unspecified, with unspecified angina pectoris: Secondary | ICD-10-CM | POA: Diagnosis present

## 2018-03-20 DIAGNOSIS — N184 Chronic kidney disease, stage 4 (severe): Secondary | ICD-10-CM | POA: Diagnosis present

## 2018-03-20 DIAGNOSIS — D631 Anemia in chronic kidney disease: Secondary | ICD-10-CM | POA: Diagnosis present

## 2018-03-20 DIAGNOSIS — E785 Hyperlipidemia, unspecified: Secondary | ICD-10-CM | POA: Diagnosis present

## 2018-03-20 DIAGNOSIS — E1122 Type 2 diabetes mellitus with diabetic chronic kidney disease: Secondary | ICD-10-CM

## 2018-03-20 DIAGNOSIS — I998 Other disorder of circulatory system: Secondary | ICD-10-CM | POA: Diagnosis not present

## 2018-03-20 DIAGNOSIS — I2729 Other secondary pulmonary hypertension: Secondary | ICD-10-CM | POA: Diagnosis present

## 2018-03-20 DIAGNOSIS — I255 Ischemic cardiomyopathy: Secondary | ICD-10-CM | POA: Diagnosis present

## 2018-03-20 DIAGNOSIS — Z7189 Other specified counseling: Secondary | ICD-10-CM

## 2018-03-20 DIAGNOSIS — N183 Chronic kidney disease, stage 3 unspecified: Secondary | ICD-10-CM | POA: Diagnosis present

## 2018-03-20 DIAGNOSIS — Z885 Allergy status to narcotic agent status: Secondary | ICD-10-CM

## 2018-03-20 DIAGNOSIS — I70213 Atherosclerosis of native arteries of extremities with intermittent claudication, bilateral legs: Secondary | ICD-10-CM | POA: Diagnosis present

## 2018-03-20 DIAGNOSIS — R570 Cardiogenic shock: Secondary | ICD-10-CM | POA: Diagnosis not present

## 2018-03-20 DIAGNOSIS — E782 Mixed hyperlipidemia: Secondary | ICD-10-CM

## 2018-03-20 DIAGNOSIS — I209 Angina pectoris, unspecified: Secondary | ICD-10-CM | POA: Diagnosis not present

## 2018-03-20 DIAGNOSIS — I25708 Atherosclerosis of coronary artery bypass graft(s), unspecified, with other forms of angina pectoris: Secondary | ICD-10-CM

## 2018-03-20 DIAGNOSIS — N170 Acute kidney failure with tubular necrosis: Secondary | ICD-10-CM | POA: Diagnosis not present

## 2018-03-20 DIAGNOSIS — Z452 Encounter for adjustment and management of vascular access device: Secondary | ICD-10-CM | POA: Diagnosis not present

## 2018-03-20 DIAGNOSIS — F419 Anxiety disorder, unspecified: Secondary | ICD-10-CM | POA: Diagnosis not present

## 2018-03-20 DIAGNOSIS — I5022 Chronic systolic (congestive) heart failure: Secondary | ICD-10-CM

## 2018-03-20 DIAGNOSIS — I10 Essential (primary) hypertension: Secondary | ICD-10-CM | POA: Diagnosis present

## 2018-03-20 DIAGNOSIS — E669 Obesity, unspecified: Secondary | ICD-10-CM | POA: Diagnosis not present

## 2018-03-20 DIAGNOSIS — E871 Hypo-osmolality and hyponatremia: Secondary | ICD-10-CM | POA: Diagnosis not present

## 2018-03-20 DIAGNOSIS — I2583 Coronary atherosclerosis due to lipid rich plaque: Secondary | ICD-10-CM

## 2018-03-20 DIAGNOSIS — I5082 Biventricular heart failure: Secondary | ICD-10-CM | POA: Diagnosis present

## 2018-03-20 DIAGNOSIS — I214 Non-ST elevation (NSTEMI) myocardial infarction: Secondary | ICD-10-CM | POA: Diagnosis not present

## 2018-03-20 DIAGNOSIS — I480 Paroxysmal atrial fibrillation: Secondary | ICD-10-CM | POA: Diagnosis present

## 2018-03-20 DIAGNOSIS — I739 Peripheral vascular disease, unspecified: Secondary | ICD-10-CM | POA: Diagnosis not present

## 2018-03-20 DIAGNOSIS — Z7984 Long term (current) use of oral hypoglycemic drugs: Secondary | ICD-10-CM

## 2018-03-20 DIAGNOSIS — J9811 Atelectasis: Secondary | ICD-10-CM | POA: Diagnosis not present

## 2018-03-20 DIAGNOSIS — Z955 Presence of coronary angioplasty implant and graft: Secondary | ICD-10-CM

## 2018-03-20 DIAGNOSIS — E1169 Type 2 diabetes mellitus with other specified complication: Secondary | ICD-10-CM | POA: Diagnosis not present

## 2018-03-20 DIAGNOSIS — Z8249 Family history of ischemic heart disease and other diseases of the circulatory system: Secondary | ICD-10-CM

## 2018-03-20 DIAGNOSIS — R079 Chest pain, unspecified: Secondary | ICD-10-CM | POA: Diagnosis not present

## 2018-03-20 DIAGNOSIS — I110 Hypertensive heart disease with heart failure: Secondary | ICD-10-CM | POA: Diagnosis present

## 2018-03-20 DIAGNOSIS — Z6837 Body mass index (BMI) 37.0-37.9, adult: Secondary | ICD-10-CM

## 2018-03-20 DIAGNOSIS — E1151 Type 2 diabetes mellitus with diabetic peripheral angiopathy without gangrene: Secondary | ICD-10-CM | POA: Diagnosis present

## 2018-03-20 DIAGNOSIS — E8770 Fluid overload, unspecified: Secondary | ICD-10-CM

## 2018-03-20 DIAGNOSIS — G4700 Insomnia, unspecified: Secondary | ICD-10-CM | POA: Diagnosis not present

## 2018-03-20 DIAGNOSIS — Z7951 Long term (current) use of inhaled steroids: Secondary | ICD-10-CM

## 2018-03-20 DIAGNOSIS — M545 Low back pain: Secondary | ICD-10-CM | POA: Diagnosis not present

## 2018-03-20 DIAGNOSIS — R609 Edema, unspecified: Secondary | ICD-10-CM | POA: Diagnosis not present

## 2018-03-20 DIAGNOSIS — I129 Hypertensive chronic kidney disease with stage 1 through stage 4 chronic kidney disease, or unspecified chronic kidney disease: Secondary | ICD-10-CM | POA: Diagnosis not present

## 2018-03-20 DIAGNOSIS — Z9841 Cataract extraction status, right eye: Secondary | ICD-10-CM

## 2018-03-20 DIAGNOSIS — I5020 Unspecified systolic (congestive) heart failure: Secondary | ICD-10-CM | POA: Diagnosis not present

## 2018-03-20 DIAGNOSIS — Z515 Encounter for palliative care: Secondary | ICD-10-CM | POA: Diagnosis not present

## 2018-03-20 DIAGNOSIS — I25710 Atherosclerosis of autologous vein coronary artery bypass graft(s) with unstable angina pectoris: Secondary | ICD-10-CM | POA: Diagnosis present

## 2018-03-20 DIAGNOSIS — N289 Disorder of kidney and ureter, unspecified: Secondary | ICD-10-CM | POA: Diagnosis not present

## 2018-03-20 DIAGNOSIS — I5021 Acute systolic (congestive) heart failure: Secondary | ICD-10-CM | POA: Diagnosis not present

## 2018-03-20 DIAGNOSIS — Z90710 Acquired absence of both cervix and uterus: Secondary | ICD-10-CM

## 2018-03-20 DIAGNOSIS — Z9842 Cataract extraction status, left eye: Secondary | ICD-10-CM

## 2018-03-20 DIAGNOSIS — I2510 Atherosclerotic heart disease of native coronary artery without angina pectoris: Secondary | ICD-10-CM

## 2018-03-20 DIAGNOSIS — K219 Gastro-esophageal reflux disease without esophagitis: Secondary | ICD-10-CM | POA: Diagnosis present

## 2018-03-20 DIAGNOSIS — Z888 Allergy status to other drugs, medicaments and biological substances status: Secondary | ICD-10-CM

## 2018-03-20 DIAGNOSIS — I252 Old myocardial infarction: Secondary | ICD-10-CM

## 2018-03-20 DIAGNOSIS — D6489 Other specified anemias: Secondary | ICD-10-CM | POA: Diagnosis not present

## 2018-03-20 DIAGNOSIS — I130 Hypertensive heart and chronic kidney disease with heart failure and stage 1 through stage 4 chronic kidney disease, or unspecified chronic kidney disease: Secondary | ICD-10-CM | POA: Diagnosis not present

## 2018-03-20 DIAGNOSIS — Z87891 Personal history of nicotine dependence: Secondary | ICD-10-CM

## 2018-03-20 DIAGNOSIS — Z96612 Presence of left artificial shoulder joint: Secondary | ICD-10-CM | POA: Diagnosis present

## 2018-03-20 LAB — I-STAT TROPONIN, ED: Troponin i, poc: 1.1 ng/mL (ref 0.00–0.08)

## 2018-03-20 LAB — CBC
HEMATOCRIT: 37.4 % (ref 36.0–46.0)
HEMOGLOBIN: 12.2 g/dL (ref 12.0–15.0)
MCH: 31 pg (ref 26.0–34.0)
MCHC: 32.6 g/dL (ref 30.0–36.0)
MCV: 94.9 fL (ref 78.0–100.0)
Platelets: 190 10*3/uL (ref 150–400)
RBC: 3.94 MIL/uL (ref 3.87–5.11)
RDW: 16.8 % — ABNORMAL HIGH (ref 11.5–15.5)
WBC: 8.2 10*3/uL (ref 4.0–10.5)

## 2018-03-20 LAB — BASIC METABOLIC PANEL
ANION GAP: 19 — AB (ref 5–15)
BUN/Creatinine Ratio: 24 (ref 12–28)
BUN: 44 mg/dL — AB (ref 8–27)
BUN: 42 mg/dL — ABNORMAL HIGH (ref 6–20)
CALCIUM: 9.8 mg/dL (ref 8.7–10.3)
CHLORIDE: 98 mmol/L (ref 96–106)
CO2: 26 mmol/L (ref 20–29)
CO2: 21 mmol/L — ABNORMAL LOW (ref 22–32)
Calcium: 9.2 mg/dL (ref 8.9–10.3)
Chloride: 98 mmol/L — ABNORMAL LOW (ref 101–111)
Creatinine, Ser: 1.83 mg/dL — ABNORMAL HIGH (ref 0.57–1.00)
Creatinine, Ser: 1.84 mg/dL — ABNORMAL HIGH (ref 0.44–1.00)
GFR calc Af Amer: 31 mL/min/{1.73_m2} — ABNORMAL LOW (ref >59)
GFR calc non Af Amer: 27 mL/min/{1.73_m2} — ABNORMAL LOW (ref >59)
GFR calc non Af Amer: 26 mL/min — ABNORMAL LOW (ref >60)
GFR, EST AFRICAN AMERICAN: 30 mL/min — AB (ref >60)
GLUCOSE: 178 mg/dL — AB (ref 65–99)
Glucose, Bld: 80 mg/dL (ref 65–99)
POTASSIUM: 3.9 mmol/L (ref 3.5–5.1)
Potassium: 4.1 mmol/L (ref 3.5–5.2)
Sodium: 137 mmol/L (ref 134–144)
Sodium: 138 mmol/L (ref 135–145)

## 2018-03-20 LAB — TROPONIN T: TROPONIN T TROPT: 0.2 ng/mL — AB (ref <0.011)

## 2018-03-20 MED ORDER — ASPIRIN 81 MG PO CHEW
162.0000 mg | CHEWABLE_TABLET | Freq: Once | ORAL | Status: AC
  Administered 2018-03-20: 162 mg via ORAL
  Filled 2018-03-20: qty 2

## 2018-03-20 MED ORDER — HEPARIN BOLUS VIA INFUSION
3000.0000 [IU] | Freq: Once | INTRAVENOUS | Status: AC
  Administered 2018-03-20: 3000 [IU] via INTRAVENOUS
  Filled 2018-03-20: qty 3000

## 2018-03-20 MED ORDER — FUROSEMIDE 10 MG/ML IJ SOLN
60.0000 mg | Freq: Once | INTRAMUSCULAR | Status: AC
  Administered 2018-03-20: 60 mg via INTRAVENOUS
  Filled 2018-03-20: qty 6

## 2018-03-20 MED ORDER — HEPARIN (PORCINE) IN NACL 100-0.45 UNIT/ML-% IJ SOLN
650.0000 [IU]/h | INTRAMUSCULAR | Status: DC
  Administered 2018-03-20 – 2018-03-23 (×3): 650 [IU]/h via INTRAVENOUS
  Filled 2018-03-20 (×3): qty 250

## 2018-03-20 MED ORDER — ISOSORBIDE MONONITRATE ER 30 MG PO TB24: 30.0000 mg | ORAL_TABLET | Freq: Every day | ORAL | 3 refills | Status: DC

## 2018-03-20 NOTE — ED Notes (Signed)
Trop. noted to be .200 at earlier today at doctors office.

## 2018-03-20 NOTE — ED Notes (Signed)
Pt taken back and EKG done. EDP made aware about pts elevated trop. This evening.

## 2018-03-20 NOTE — ED Provider Notes (Addendum)
[facility] Provider Note   CSN: [id number] Arrival date & time: 03/20/18  1844     History   Chief Complaint Chief Complaint  Patient presents with  . Chest Pain    HPI Eber JonesMarilyn P Machuca is a 72 y.o. female.  72 yo F with a chief complaint of chest pain.  The patient had this yesterday.  She had her nails and toes done and then had a massage during which she started to have some chest pressure.  This worsened when she had fries from [employer].  By the time she got home she felt that the pressure was excruciating.  She denied radiation denies shortness of breath denied exertional symptoms.  She took 4 baby aspirins and a nitroglycerin and the symptoms resolved.  Today she went to the cardiologist to felt that she needed a catheterization to further evaluate this event and ordered lab work.  Her troponin and being positive and she was called to come to the emergency department.  She denies any continued symptoms.  She has some increased swelling to her legs though she has not taken her Lasix this afternoon and has been in a seated position for a few hours.  The history is provided by the patient.  Chest Pain   This is a new problem. The current episode started yesterday. The problem occurs rarely. The problem has been resolved. The pain is associated with eating and rest. The pain is present in the substernal region. The pain is at a severity of 9/10. The pain is severe. The quality of the pain is described as heavy. The pain does not radiate. Duration of episode(s) is 2 hours. Pertinent negatives include no dizziness, no fever, no headaches, no nausea, no palpitations, no shortness of breath and no vomiting. She has tried rest and nitroglycerin for the symptoms. The treatment provided significant relief.    Past Medical History:  Diagnosis Date  . Arthritis   . CKD (chronic kidney disease)   . Coronary artery disease   . Depression   .  Diabetes mellitus without complication (HCC)   . Hyperlipidemia   . Hypertension   . Myocardial infarction [facility](HCC) 2002   MI x 2 - one prior to 2002    Patient Active Problem List   Diagnosis Date Noted  . Chronic systolic heart failure (HCC) 03/12/2018  . Cough variant asthma 03/02/2018  . Dyspnea on exertion 03/02/2018  . Diabetes mellitus type 2 in obese (HCC) 01/30/2017  . CKD stage 3 due to type 2 diabetes mellitus (HCC) 01/30/2017  . Chest pain 01/29/2017  . Gout 02/13/2016  . History of carotid angioplasty 02/13/2016  . Hx of CABG 02/13/2016  . CKD (chronic kidney disease), stage III (HCC) 02/13/2016  . Type 2 diabetes mellitus with stage 3 chronic kidney disease and hypertension (HCC) 02/13/2016  . 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 Coronary artery disease involving coronary bypass graft of native heart with angina pectoris (HCC) 02/28/2014  . Essential hypertension 02/28/2014  . Hyperlipidemia 02/28/2014  . Chronic coronary artery disease 02/28/2014    Past Surgical History:  Procedure Laterality Date  . ABDOMINAL HYSTERECTOMY    . CAROTID ENDARTERECTOMY Left 05/21/2001  . CHOLECYSTECTOMY    . CORONARY ANGIOPLASTY WITH STENT PLACEMENT  2003  . CORONARY ARTERY BYPASS GRAFT  2002  . EYE SURGERY Bilateral    cataract  . OVARIAN CYST REMOVAL    . REVERSE SHOULDER ARTHROPLASTY Left 04/05/2014   Procedure: LEFT  REVERSE SHOULDER ARTHROPLASTY;  Surgeon: Senaida Lange, MD;  Location: [facility] OR;  Service: Orthopedics;  Laterality: Left;  . TUBAL LIGATION       OB History   None      Home Medications    Prior to Admission medications   Medication Sig Start Date End Date Taking? Authorizing Provider  albuterol (PROVENTIL HFA;VENTOLIN HFA) 108 (90 Base) MCG/ACT inhaler INHALE 2 PUFFS BY MOUTH EVERY 6 HOURS AS NEEDED 01/29/18   [provider]  aspirin 81 MG tablet Take 81 mg by mouth daily.    [provider]  atorvastatin (LIPITOR) 40 MG tablet Take 40  mg by mouth daily.    [provider]  bisoprolol (ZEBETA) 5 MG tablet One twice daily 03/02/18   Nyoka Cowden, MD  cycloSPORINE (RESTASIS) 0.05 % ophthalmic emulsion Place 1 drop into both eyes 2 (two) times daily.    [provider]  fluticasone (FLONASE) 50 MCG/ACT nasal spray Place 1 spray into both nostrils daily.    [provider]  furosemide (LASIX) 80 MG tablet Take one tablet by mouth every morning.  Take 1/2 tablet (40mg ) by mouth every evening. 03/13/18   Lyn Records, MD  GLIMEPIRIDE PO Take 1 mg by mouth daily.    [provider]  isosorbide mononitrate (IMDUR) 30 MG 24 hr tablet Take 1 tablet (30 mg total) by mouth daily. 03/20/18 06/18/18  Leone Brand, NP  metFORMIN (GLUCOPHAGE-XR) 500 MG 24 hr tablet Take 1,000 mg by mouth daily.     [provider]  nitroGLYCERIN (NITROSTAT) 0.4 MG SL tablet Place 0.4 mg under the tongue every 5 (five) minutes x 3 doses as needed. (chest pain) 01/08/16   [provider]  Omega-3 Fatty Acids (OMEGA 3 PO) Take 2 capsules by mouth daily.     [provider]  pantoprazole (PROTONIX) 40 MG tablet Take 1 tablet (40 mg total) by mouth daily. Take 30-60 min before first meal of the day 03/02/18   Nyoka Cowden, MD  probenecid (BENEMID) 500 MG tablet Take 500 mg by mouth 2 (two) times daily.    [provider]  ranitidine (ZANTAC) 150 MG tablet Take 150 mg by mouth at bedtime.    [provider]  sacubitril-valsartan (ENTRESTO) 24-26 MG Take 1 tablet by mouth 2 (two) times daily. 03/13/18   Lyn Records, MD    Family History Family History  Problem Relation Age of Onset  . Heart attack Father 62    Social History Social History   Tobacco Use  . Smoking status: Former Smoker    Packs/day: 0.75    Years: 36.00    Pack years: 27.00    Types: Cigarettes    Last attempt to quit: 12/16/1998    Years since quitting: 19.2  . Smokeless tobacco: Never Used    Substance Use Topics  . Alcohol use: Yes    Alcohol/week: 0.0 oz    Comment: strawberry dacquiri once a year  . Drug use: No     Allergies   Metoprolol; Percocet [oxycodone-acetaminophen]; Codeine; and Benazepril   Review of Systems Review of Systems  Constitutional: Negative for chills and fever.  HENT: Negative for congestion and rhinorrhea.   Eyes: Negative for redness and visual disturbance.  Respiratory: Negative for shortness of breath and wheezing.   Cardiovascular: Positive for chest pain. Negative for palpitations.  Gastrointestinal: Negative for nausea and vomiting.  Genitourinary: Negative for dysuria and urgency.  Musculoskeletal:  Negative for arthralgias and myalgias.  Skin: Negative for pallor and wound.  Neurological: Negative for dizziness and headaches.     Physical Exam Updated Vital Signs BP (!) 133/104 (BP Location: Right Arm)   Pulse 72   Temp 97.9 F (36.6 C) (Oral)   Resp 20   SpO2 98%   Physical Exam  Constitutional: She is oriented to person, place, and time. She appears well-developed and well-nourished. No distress.  HENT:  Head: Normocephalic and atraumatic.  Eyes: Pupils are equal, round, and reactive to light. EOM are normal.  Neck: Normal range of motion. Neck supple.  Cardiovascular: Normal rate and regular rhythm. Exam reveals no gallop and no friction rub.  No murmur heard. Pulmonary/Chest: Effort normal. She has no wheezes. She has no rales.  Abdominal: Soft. She exhibits no distension. There is no tenderness.  Musculoskeletal: She exhibits no tenderness.       Right lower leg: She exhibits edema (2+).       Left lower leg: She exhibits edema (2+).  Neurological: She is alert and oriented to person, place, and time.  Skin: Skin is warm and dry. She is not diaphoretic.  Psychiatric: She has a normal mood and affect. Her behavior is normal.  Nursing note and vitals reviewed.    ED Treatments / Results  Labs (all labs ordered  are listed, but only abnormal results are displayed) Labs Reviewed  BASIC METABOLIC PANEL - Abnormal; Notable for the following components:      Result Value   Chloride 98 (*)    CO2 21 (*)    BUN 42 (*)    Creatinine, Ser 1.84 (*)    GFR calc non Af Amer 26 (*)    GFR calc Af Amer 30 (*)    Anion gap 19 (*)    All other components within normal limits  CBC - Abnormal; Notable for the following components:   RDW 16.8 (*)    All other components within normal limits  I-STAT TROPONIN, ED - Abnormal; Notable for the following components:   Troponin i, poc 1.10 (*)    All other components within normal limits  HEPARIN LEVEL (UNFRACTIONATED)  CBC    EKG EKG Interpretation  Date/Time:  Friday March 20 2018 18:51:46 EDT Ventricular Rate:  72 PR Interval:  138 QRS Duration: 76 QT Interval:  422 QTC Calculation: 462 R Axis:   -42 Text Interpretation:  Normal sinus rhythm Left axis deviation Anterolateral infarct , age undetermined Abnormal ECG No significant change since last tracing Confirmed by Melene Plan [phone]) on 03/20/2018 8:20:57 PM   Radiology Dg Chest 2 View  Result Date: 03/20/2018 CLINICAL DATA:  Chest pain EXAM: CHEST - 2 VIEW COMPARISON:  01/28/2018 FINDINGS: Post sternotomy changes. Mild cardiomegaly with aortic atherosclerosis. Tiny pleural effusions on the lateral view. No focal consolidation. Mild bronchitic changes. No pneumothorax. Left shoulder replacement. Degenerative changes of the spine. IMPRESSION: 1. Tiny pleural effusions. 2. Cardiomegaly Electronically Signed   By: Jasmine Pang M.D.   On: 03/20/2018 20:00    Procedures Procedures (including critical care time)  Medications Ordered in ED Medications  aspirin chewable tablet 162 mg (has no administration in time range)  heparin bolus via infusion 3,000 Units (has no administration in time range)  heparin ADULT infusion 100 units/mL (25000 units/285mL sodium chloride 0.45%) (has no administration in  time range)     Initial Impression / Assessment and Plan / ED Course  I have reviewed the triage vital signs  and the nursing notes.  Pertinent labs & imaging results that were available during my care of the patient were reviewed by me and considered in my medical decision making (see chart for details).  Clinical Course as of Mar 20 2110  Fri Mar 20, 2018  1951 Notified charge RN of positive troponin as report from [employer]   [EH]    Clinical Course User Index [EH] Cristina Gong, [place]    72 yo F with a chief complaint of chest pressure.  This occurred yesterday and has resolved.  Patient unfortunately has a positive troponin concerning for this being a heart attack.  I discussed with the cardiology fellow who recommended starting on her heparin drip and he will admit to the hospital.  CRITICAL CARE Performed by: Rae Roam   Total critical care time: 35 minutes  Critical care time was exclusive of separately billable procedures and treating other patients.  Critical care was necessary to treat or prevent imminent or life-threatening deterioration.  Critical care was time spent personally by me on the following activities: development of treatment plan with patient and/or surrogate as well as nursing, discussions with consultants, evaluation of patient's response to treatment, examination of patient, obtaining history from patient or surrogate, ordering and performing treatments and interventions, ordering and review of laboratory studies, ordering and review of radiographic studies, pulse oximetry and re-evaluation of patient's condition.  The patients results and plan were reviewed and discussed.   Any x-rays performed were independently reviewed by myself.   Differential diagnosis were considered with the presenting HPI.  Medications  aspirin chewable tablet 162 mg (has no administration in time range)  heparin bolus via infusion 3,000 Units (has no  administration in time range)  heparin ADULT infusion 100 units/mL (25000 units/233mL sodium chloride 0.45%) (has no administration in time range)    Vitals:   03/20/18 1851  BP: (!) 133/104  Pulse: 72  Resp: 20  Temp: 97.9 F (36.6 C)  TempSrc: Oral  SpO2: 98%    Final diagnoses:  NSTEMI (non-ST elevated myocardial infarction) [facility])    Admission/ observation were discussed with the admitting physician, patient and/or family and they are comfortable with the plan.   Final Clinical Impressions(s) / ED Diagnoses   Final diagnoses:  NSTEMI (non-ST elevated myocardial infarction) [facility])    ED Discharge Orders    None       Melene Plan, DO 03/20/18 2110    Melene Plan, DO 03/20/18 2111

## 2018-03-20 NOTE — Progress Notes (Signed)
Cardiology Office Note   Date:  03/20/2018   ID:  NETRA POSTLETHWAIT, DOB 16-Sep-1946, MRN [id number]  PCP:  Lenell Antu, DO  Cardiologist:  Dr. Katrinka Blazing     Chief Complaint  Patient presents with  . Shortness of Breath    did have chest pain yesterday      History of Present Illness: Hailey Levine is a 72 y.o. female who presents for new cardiomyopathy follow up and angina last pm   She has a history of CAD s/p MI and 3v CABG in 2002 with subsequent stenting 2003, HTN, HLD, DM, and CKD stage 3.  Since November, Hailey Levine has suffered with orthopnea and cough.  She has had difficulty breathing.  She saw Dr. Sherene Sires who went through a series of tests culminating in the echocardiogram which demonstrated severe systolic heart failure.  Recent echo demonstrated EF 25%.  Last EF assessment was at the time of a myocardial perfusion imaging study when in February 2018 when the wall motion study estimated an EF of 81%.  In February she had an episode for which she awakened, was diaphoretic, and felt as though she were smothering.  She took 3 nitroglycerin tablets, 4 aspirin, and after 20 minutes began feeling better.  She did not report this.  There was no associated discomfort of any type.  The coronary history is that of an anterior infarction in 2002 followed by coronary artery bypass grafting.  Blood pressure has been difficult to control over the years.  EF of 20-25% G2 DD.  PA pk pressure 60 mmHg.  Pt's ARB was stopped and placed on Entresto and lasix increased.    Today she is here for follow up and medication adjustment.    While overall she feels better with less SOB she had an episode yesterday after working out and then [employer] she developed chest pain, she thought she was having a heart attack.  She chewed 4 baby asa and then took a NTG and after 20 min her pain resolved.  Today she feels well.  She is not sure her NTG are good anymore either.    Past Medical History:   Diagnosis Date  . Arthritis   . CKD (chronic kidney disease)   . Coronary artery disease   . Depression   . Diabetes mellitus without complication (HCC)   . Hyperlipidemia   . Hypertension   . Myocardial infarction (HCC) 2002   MI x 2 - one prior to 2002    Past Surgical History:  Procedure Laterality Date  . ABDOMINAL HYSTERECTOMY    . CAROTID ENDARTERECTOMY Left 05/21/2001  . CHOLECYSTECTOMY    . CORONARY ANGIOPLASTY WITH STENT PLACEMENT  2003  . CORONARY ARTERY BYPASS GRAFT  2002  . EYE SURGERY Bilateral    cataract  . OVARIAN CYST REMOVAL    . REVERSE SHOULDER ARTHROPLASTY Left 04/05/2014   Procedure: LEFT REVERSE SHOULDER ARTHROPLASTY;  Surgeon: Senaida Lange, MD;  Location: [facility] OR;  Service: Orthopedics;  Laterality: Left;  . TUBAL LIGATION       Current Outpatient Medications  Medication Sig Dispense Refill  . albuterol (PROVENTIL HFA;VENTOLIN HFA) 108 (90 Base) MCG/ACT inhaler INHALE 2 PUFFS BY MOUTH EVERY 6 HOURS AS NEEDED  0  . aspirin 81 MG tablet Take 81 mg by mouth daily.    Marland Kitchen atorvastatin (LIPITOR) 40 MG tablet Take 40 mg by mouth daily.    . bisoprolol (ZEBETA) 5 MG tablet One twice daily 60 tablet  2  . cycloSPORINE (RESTASIS) 0.05 % ophthalmic emulsion Place 1 drop into both eyes 2 (two) times daily.    . fluticasone (FLONASE) 50 MCG/ACT nasal spray Place 1 spray into both nostrils daily.    . furosemide (LASIX) 80 MG tablet Take one tablet by mouth every morning.  Take 1/2 tablet (40mg ) by mouth every evening. 135 tablet 1  . GLIMEPIRIDE PO Take 1 mg by mouth daily.    . metFORMIN (GLUCOPHAGE-XR) 500 MG 24 hr tablet Take 1,000 mg by mouth daily.     . nitroGLYCERIN (NITROSTAT) 0.4 MG SL tablet Place 0.4 mg under the tongue every 5 (five) minutes x 3 doses as needed. (chest pain)    . Omega-3 Fatty Acids (OMEGA 3 PO) Take 2 capsules by mouth daily.     . pantoprazole (PROTONIX) 40 MG tablet Take 1 tablet (40 mg total) by mouth daily. Take 30-60 min before  first meal of the day 30 tablet 2  . probenecid (BENEMID) 500 MG tablet Take 500 mg by mouth 2 (two) times daily.    . ranitidine (ZANTAC) 150 MG tablet Take 150 mg by mouth at bedtime.    . sacubitril-valsartan (ENTRESTO) 24-26 MG Take 1 tablet by mouth 2 (two) times daily. 60 tablet 3  . isosorbide mononitrate (IMDUR) 30 MG 24 hr tablet Take 1 tablet (30 mg total) by mouth daily. 90 tablet 3   No current facility-administered medications for this visit.     Allergies:   Metoprolol; Percocet [oxycodone-acetaminophen]; Codeine; and Benazepril    Social History:  The patient  reports that she quit smoking about 19 years ago. Her smoking use included cigarettes. She has a 27.00 pack-year smoking history. She has never used smokeless tobacco. She reports that she drinks alcohol. She reports that she does not use drugs.   Family History:  The patient's family history includes Heart attack (age of onset: 54) in her father.    ROS:  General:no colds or fevers, no weight changes Skin:no rashes or ulcers HEENT:no blurred vision, no congestion CV:see HPI PUL:see HPI GI:no diarrhea constipation or melena, no indigestion GU:no hematuria, no dysuria MS:no joint pain, no claudication Neuro:no syncope, no lightheadedness Endo:+ diabetes, no thyroid disease  Wt Readings from Last 3 Encounters:  03/20/18 159 lb (72.1 kg)  03/13/18 158 lb 6.4 oz (71.8 kg)  03/02/18 159 lb (72.1 kg)     PHYSICAL EXAM: VS:  BP 130/80   Pulse 70   Ht 4' 9.5" (1.461 m)   Wt 159 lb (72.1 kg)   SpO2 94%   BMI 33.81 kg/m  , BMI Body mass index is 33.81 kg/m. General:Pleasant affect, NAD Skin:Warm and dry, brisk capillary refill HEENT:normocephalic, sclera clear, mucus membranes moist Neck:supple, no JVD, no bruits  Heart:S1S2 RRR without murmur, gallup, rub or click Lungs:clear without rales, rhonchi, or wheezes ZOX:WRUE, non tender, + BS, do not palpate liver spleen or masses Ext:1-2 + edema mid calf down  bil, 2+ radial pulses Neuro:alert and oriented, X 3 MAE, follows commands, + facial symmetry    EKG:  EKG is ordered today.due to chest pain yesterday  The ekg ordered today demonstrates SR with old ant MI and fusion PVC but no acute changes.   Recent Labs: 05/12/2017: ALT 12 03/02/2018: Hemoglobin 11.3; Platelets 166.0; Pro B Natriuretic peptide (BNP) 2,115.0 03/20/2018: BUN 44; Creatinine, Ser 1.83; Potassium 4.1; Sodium 137    Lipid Panel    Component Value Date/Time   CHOL 188 05/12/2017 1058  TRIG 153 (H) 05/12/2017 1058   HDL 49 05/12/2017 1058   CHOLHDL 3.8 05/12/2017 1058   CHOLHDL 4.0 01/30/2017 0116   VLDL 17 01/30/2017 0116   LDLCALC 108 (H) 05/12/2017 1058       Other studies Reviewed: Additional studies/ records that were reviewed today include: 03/10/18.  Study Conclusions  - Left ventricle: The cavity size was normal. Systolic function was   severely reduced. The estimated ejection fraction was in the   range of 20% to 25%. Wall motion was normal; there were no   regional wall motion abnormalities. Features are consistent with   a pseudonormal left ventricular filling pattern, with concomitant   abnormal relaxation and increased filling pressure (grade 2   diastolic dysfunction). Doppler parameters are consistent with   elevated ventricular end-diastolic filling pressure. - Mitral valve: There was mild regurgitation. - Left atrium: The atrium was mildly dilated. - Right ventricle: The cavity size was moderately dilated. Wall   thickness was normal. Systolic function was severely reduced. - Tricuspid valve: There was moderate regurgitation. - Pulmonary arteries: Systolic pressure was moderately increased.   PA peak pressure: 60 mm Hg (S).  Impressions:  - There is severe biventricular systolic dysfunction. 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 No thrombus is seen on Definity   echocontrast images.  ASSESSMENT AND PLAN:  1.  Angina with possible MI, no acute EKG changes will get stat Troponin T and discussed with Pt is + will need to be admitted.   Dr. Katrinka BlazingSmith planned to add Imdur so will add today imdur 30 mg daily.   I will see her back in 2 weeks for medication adjustments.    2.  Acute systolic HF with new cardiomyopathy with EF 20-25%.  Entresto started at 24/26 mg BID and losartan was stopped.  Her lasix was increased to 80 in AM and 40 in PM with this she has felt better, no further cough  She had been on steroids as well and these were completed last SAT.  Will continue lasix at current dose.   Ultimate plan is to have cardiac cath. Will check pro bnp today previously > 2000 BNP  3.  CAD with MI with hx CABG  X 3v in 2002 and subsequent stenting in 2003. Dr. Katrinka BlazingSmith felt pt had graft closure.  4.  CKD stage 3 will recheck Cr today on entresto and if stable will increase entresto.   5.  HTN improved  6.  HLD continue statin and Zetia   7  DM-2 on glimepiride and metformin followed by PCP.     ADDENDUM  Pt Troponin T elevated critical 0.200  Normal <0.011 Sending to ER for NSTEMI.   Dr. Katrinka BlazingSmith and on call team aware.  Notified Triage RN as well.      Current medicines are reviewed with the patient today.  The patient Has no concerns regarding medicines.  The following changes have been made:  See above Labs/ tests ordered today include:see above  Disposition:   FU:  see above  Signed, Nada BoozerLaura Korine Winton, NP  03/20/2018 6:01 PM    [facility] [address], [place], [place]  [address]/ [address] [place], [place] Phone: [phone]; Fax: [phone]  [phone]

## 2018-03-20 NOTE — H&P (Addendum)
Cardiology Admission History and Physical:   Patient ID: Hailey Levine; MRN: [id number]; DOB: 05-20-1946   Admission date: 03/20/2018  Primary Care Provider: Lenell Antu, DO Primary Cardiologist: Katrinka Blazing  Chief Complaint:  CP  Patient Profile:   Hailey Levine is a 72 year old female with a history of CAD s/p MI and 3v CABG in 2002 with subsequent stenting 2003, HFrEF (EF 20%), HTN, HLD, DM, and CKD who presents after an episode of chest pain.  History of Present Illness:    Hailey Levine is a 72 year old female with a history of CAD s/p MI and 3v CABG in 2002 with subsequent stenting 2003, HFrEF (EF 20%), HTN, HLD, DM, and CKD who presents after an episode of chest pain.  The patient has multiple medical comorbidities for which she follows with Dr. Katrinka Blazing. She was seen in cardiology clinic today and described an episode of chest pain that occurred yesterday when she was driving home after getting a manicure. She states that she experienced sudden onset pressure (like a donkey sitting on her chest) without any associated symptoms. She took a NTG and ASA with slow resolution of the pain. She had no recurrence of her symptoms at the time of her clinic visit. A troponin was checked and found to be elevated at 0.2, so she was directed to the ED.  In the ED, the patient had repeat troponin that was elevated at 1.10. Labs also notable for Cr 1.84 (about at baseline). ECG showed NSR with poor R wave progression and nonspecific ST changes. She was started on a heparin infusion, and cardiology was consulted for admission. On my evaluation, the patient reports that she has been persistently free of chest pain since her episode yesterday. She does describe episodes of waking at night short of breath and coughing, but she denies all other complaints.   Past Medical History:  Diagnosis Date  . Arthritis   . CKD (chronic kidney disease)   . Coronary artery disease   . Depression   . Diabetes mellitus  without complication (HCC)   . Hyperlipidemia   . Hypertension   . Myocardial infarction (HCC) 2002   MI x 2 - one prior to 2002    Past Surgical History:  Procedure Laterality Date  . ABDOMINAL HYSTERECTOMY    . CAROTID ENDARTERECTOMY Left 05/21/2001  . CHOLECYSTECTOMY    . CORONARY ANGIOPLASTY WITH STENT PLACEMENT  2003  . CORONARY ARTERY BYPASS GRAFT  2002  . EYE SURGERY Bilateral    cataract  . OVARIAN CYST REMOVAL    . REVERSE SHOULDER ARTHROPLASTY Left 04/05/2014   Procedure: LEFT REVERSE SHOULDER ARTHROPLASTY;  Surgeon: Senaida Lange, MD;  Location: [facility] OR;  Service: Orthopedics;  Laterality: Left;  . TUBAL LIGATION       Medications Prior to Admission: Prior to Admission medications   Medication Sig Start Date End Date Taking? Authorizing Provider  albuterol (PROVENTIL HFA;VENTOLIN HFA) 108 (90 Base) MCG/ACT inhaler INHALE 2 PUFFS BY MOUTH EVERY 6 HOURS AS NEEDED 01/29/18   [provider]  aspirin 81 MG tablet Take 81 mg by mouth daily.    [provider]  atorvastatin (LIPITOR) 40 MG tablet Take 40 mg by mouth daily.    [provider]  bisoprolol (ZEBETA) 5 MG tablet One twice daily 03/02/18   Nyoka Cowden, MD  cycloSPORINE (RESTASIS) 0.05 % ophthalmic emulsion Place 1 drop into both eyes 2 (two) times daily.    [provider]  fluticasone (FLONASE) 50 MCG/ACT nasal spray Place 1 spray into both nostrils daily.    [provider]  furosemide (LASIX) 80 MG tablet Take one tablet by mouth every morning.  Take 1/2 tablet (40mg ) by mouth every evening. 03/13/18   Lyn Records, MD  GLIMEPIRIDE PO Take 1 mg by mouth daily.    [provider]  isosorbide mononitrate (IMDUR) 30 MG 24 hr tablet Take 1 tablet (30 mg total) by mouth daily. 03/20/18 06/18/18  Leone Brand, NP  metFORMIN (GLUCOPHAGE-XR) 500 MG 24 hr tablet Take 1,000 mg by mouth daily.     [provider]  nitroGLYCERIN (NITROSTAT) 0.4 MG SL tablet  Place 0.4 mg under the tongue every 5 (five) minutes x 3 doses as needed. (chest pain) 01/08/16   [provider]  Omega-3 Fatty Acids (OMEGA 3 PO) Take 2 capsules by mouth daily.     [provider]  pantoprazole (PROTONIX) 40 MG tablet Take 1 tablet (40 mg total) by mouth daily. Take 30-60 min before first meal of the day 03/02/18   Nyoka Cowden, MD  probenecid (BENEMID) 500 MG tablet Take 500 mg by mouth 2 (two) times daily.    [provider]  ranitidine (ZANTAC) 150 MG tablet Take 150 mg by mouth at bedtime.    [provider]  sacubitril-valsartan (ENTRESTO) 24-26 MG Take 1 tablet by mouth 2 (two) times daily. 03/13/18   Lyn Records, MD     Allergies:    Allergies  Allergen Reactions  . Codeine Shortness Of Breath  . Coreg [Carvedilol] Shortness Of Breath and Cough  . Metoprolol Nausea And Vomiting    "Throws up white, foamy liquid"  . Percocet [Oxycodone-Acetaminophen] Nausea And Vomiting  . Hydrocodone Cough    This was in a cough syrup that made her cough WORSE  . Benazepril Cough    Social History:   Social History   Socioeconomic History  . Marital status: Widowed    Spouse name: Not on file  . Number of children: Not on file  . Years of education: Not on file  . Highest education level: Not on file  Occupational History  . Occupation: retired  Engineer, production  . Financial resource strain: Not on file  . Food insecurity:    Worry: Not on file    Inability: Not on file  . Transportation needs:    Medical: Not on file    Non-medical: Not on file  Tobacco Use  . Smoking status: Former Smoker    Packs/day: 0.75    Years: 36.00    Pack years: 27.00    Types: Cigarettes    Last attempt to quit: 12/16/1998    Years since quitting: 19.2  . Smokeless tobacco: Never Used  Substance and Sexual Activity  . Alcohol use: Yes    Alcohol/week: 0.0 oz    Comment: strawberry dacquiri once a year  . Drug use: No  . Sexual activity: Not  on file  Lifestyle  . Physical activity:    Days per week: Not on file    Minutes per session: Not on file  . Stress: Not on file  Relationships  . Social connections:    Talks on phone: Not on file    Gets together: Not on file    Attends religious service: Not on file    Active member of club or organization: Not on file    Attends meetings of clubs or organizations:  Not on file    Relationship status: Not on file  . Intimate partner violence:    Fear of current or ex partner: Not on file    Emotionally abused: Not on file    Physically abused: Not on file    Forced sexual activity: Not on file  Other Topics Concern  . Not on file  Social History Narrative  . Not on file    Family History:   The patient's family history includes Heart attack (age of onset: 33) in her father.    ROS:  Please see the history of present illness.  All other ROS reviewed and negative.     Physical Exam/Data:   Vitals:   03/20/18 1851  BP: (!) 133/104  Pulse: 72  Resp: 20  Temp: 97.9 F (36.6 C)  TempSrc: Oral  SpO2: 98%   No intake or output data in the 24 hours ending 03/20/18 2206 There were no vitals filed for this visit. There is no height or weight on file to calculate BMI.  General:  Well nourished, well developed, in no acute distress  HEENT: normal Neck: JVD at jaw Endocrine:  No thryomegaly Cardiac:  normal S1, S2; RRR; no murmur  Lungs:  clear to auscultation bilaterally, no wheezing, rhonchi or rales  Abd: soft, nontender, no hepatomegaly  Ext: 1+ pitting LE edema Musculoskeletal:  No deformities, BUE and BLE strength normal and equal Skin: warm and dry  Neuro:  No focal abnormalities noted Psych:  Normal affect    EKG:  The ECG that was done and was personally reviewed and demonstrates NSR with poor R wave progression and nonspecific ST changes.  Relevant CV Studies: TTE 03/10/18 - Left ventricle: The cavity size was normal. Systolic function was severely  reduced. The estimated ejection fraction was in the range of 20% to 25%. Wall motion was normal; there were no regional wall motion abnormalities. Features are consistent with a pseudonormal left ventricular filling pattern, with concomitant abnormal relaxation and increased filling pressure (grade 2 diastolic dysfunction). Doppler parameters are consistent with elevated ventricular end-diastolic filling pressure. - Mitral valve: There was mild regurgitation. - Left atrium: The atrium was mildly dilated. - Right ventricle: The cavity size was moderately dilated. Wall thickness was normal. Systolic function was severely reduced. - Tricuspid valve: There was moderate regurgitation. - Pulmonary arteries: Systolic pressure was moderately increased. PA peak pressure: 60 mm Hg (S). Impressions: - There is severe biventricular systolic dysfunction. 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 No thrombus is seen on Definity echocontrast images.    Laboratory Data:  Chemistry Recent Labs  Lab 03/20/18 1534 03/20/18 1926  NA 137 138  K 4.1 3.9  CL 98 98*  CO2 26 21*  GLUCOSE 178* 80  BUN 44* 42*  CREATININE 1.83* 1.84*  CALCIUM 9.8 9.2  GFRNONAA 27* 26*  GFRAA 31* 30*  ANIONGAP  --  19*    No results for input(s): PROT, ALBUMIN, AST, ALT, ALKPHOS, BILITOT in the last 168 hours. Hematology Recent Labs  Lab 03/20/18 1926  WBC 8.2  RBC 3.94  HGB 12.2  HCT 37.4  MCV 94.9  MCH 31.0  MCHC 32.6  RDW 16.8*  PLT 190   Cardiac EnzymesNo results for input(s): TROPONINI in the last 168 hours.  Recent Labs  Lab 03/20/18 1934  TROPIPOC 1.10*    BNPNo results for input(s): BNP, PROBNP  in the last 168 hours.  DDimer No results for input(s): DDIMER in the last 168 hours.  Radiology/Studies:  Dg Chest 2 View  Result Date:  03/20/2018 CLINICAL DATA:  Chest pain EXAM: CHEST - 2 VIEW COMPARISON:  01/28/2018 FINDINGS: Post sternotomy changes. Mild cardiomegaly with aortic atherosclerosis. Tiny pleural effusions on the lateral view. No focal consolidation. Mild bronchitic changes. No pneumothorax. Left shoulder replacement. Degenerative changes of the spine. IMPRESSION: 1. Tiny pleural effusions. 2. Cardiomegaly Electronically Signed   By: Jasmine PangKim  Fujinaga M.D.   On: 03/20/2018 20:00    Assessment and Plan:    NSTEMI CAD s/p CABG and PCI The patient has a history of known CAD with prior revascularization and presents after an isolated episode of chest pain yesterday with subsequent measurement of elevated troponin in clinic, which is now further increased in the ED today. ECG shows no acute changes and she remains chest pain free. She would benefit from repeat ischemic evaluation given this event and for her newly reduced EF . -Monitor on telemetry -Continue heparin gtt -Continue ASA, atorvastatin -Continue home bisoprolol -Continue home imdur (new medication, has not yet started) -Lipid panel, HgA1c ordered -NPO at midnight  HFrEF Recent outpatient TTE ordered in the setting of heart failure symptoms showed EF of 20-25%, down from normal value from nuclear stress test in 01/2017. She is undergoing titration of mortality reducing medical therapy. She describes symptoms consistent with PND at home and shows sings of hypervolemia on exam. -Giving IV furosemide x1. Monitor response and redose as appropriate -Continue home bisoprolol, Entresto.   CKD Cr 1.8 in the ED, which appears to be about at patient's baseline. -Continue to monitor -Gentle diuresis as above  DM2 On metformin and glimepiride at home -Holding home oral agents and giving SSI while hospitalized -A1c As above  HTN -Continue home regimen   HLD -Continue home atorvastatin -Lipid panel as above  Arthritis -Holding home probenecid while  monitoring renal function.    Severity of Illness: The appropriate patient status for this patient is INPATIENT. Inpatient status is judged to be reasonable and necessary in order to provide the required intensity of service to ensure the patient's safety. The patient's presenting symptoms, physical exam findings, and initial radiographic and laboratory data in the context of their chronic comorbidities is felt to place them at high risk for further clinical deterioration. Furthermore, it is not anticipated that the patient will be medically stable for discharge from the hospital within 2 midnights of admission. The following factors support the patient status of inpatient.   " The patient's presenting symptoms include chest pain. " The worrisome physical exam findings include hypervolemia. " The initial radiographic and laboratory data are worrisome because of elevated troponin. " The chronic co-morbidities include heart failure, CAD.   * I certify that at the point of admission it is my clinical judgment that the patient will require inpatient hospital care spanning beyond 2 midnights from the point of admission due to high intensity of service, high risk for further deterioration and high frequency of surveillance required.*    For questions or updates, please contact [facility] Please consult [email or web address] for contact info under Cardiology/STEMI.    Signed, Ernest Mallickaylor Quida Glasser, MD  03/20/2018 10:06 PM

## 2018-03-20 NOTE — ED Notes (Signed)
ED Provider at bedside. 

## 2018-03-20 NOTE — Progress Notes (Signed)
ANTICOAGULATION CONSULT NOTE - Initial Consult  Pharmacy Consult for heparin Indication: chest pain/ACS  Allergies  Allergen Reactions  . Metoprolol Nausea And Vomiting    "Throws up white foamy liquid"  . Percocet [Oxycodone-Acetaminophen] Nausea And Vomiting  . Codeine     SOB  . Benazepril Cough    Patient Measurements:   Heparin Dosing Weight: 56 kg  Vital Signs: Temp: 97.9 F (36.6 C) (03/29 1851) Temp Source: Oral (03/29 1851) BP: 133/104 (03/29 1851) Pulse Rate: 72 (03/29 1851)  Labs: Recent Labs    03/20/18 1534 03/20/18 1926  HGB  --  12.2  HCT  --  37.4  PLT  --  190  CREATININE 1.83* 1.84*    Estimated Creatinine Clearance: 22.7 mL/min (A) (by C-G formula based on SCr of 1.84 mg/dL (H)).  Assessment: CC/HPI: 72 yo f presenting with CP, new cardiomyopathy   PMH: CAD HTN HLD DM CKD3  Anticoag: none pta iv hep for r/o acs  Renal: SCr 1.84  Heme/Onc: H&H 12.2/37.4, Plt 190  Goal of Therapy:  Heparin level 0.3-0.7 units/ml Monitor platelets by anticoagulation protocol: Yes   Plan:  Heparin bolus 3000 units x 1 Heparin infusion 650 units/hr Daily hep lvl cbc F/u plans for cath  Isaac BlissMichael Davide Risdon, PharmD, BCPS, BCCCP Clinical Pharmacist Clinical phone for 03/20/2018 from 1430 - 2300: [phone] If after 2300, please call main pharmacy at: [phone] 03/20/2018 9:11 PM

## 2018-03-20 NOTE — Patient Instructions (Addendum)
Medication Instructions:  Your physician has recommended you make the following change in your medication:  1.  START Imdur 30 mg 1 tablet daily   Labwork: TODAY:  STAT BMET, PRO BNP, & TROPONIN  Testing/Procedures: None ordered  Follow-Up: Your physician recommends that you schedule a follow-up appointment in: 04/08/18 ARRIVE AT 10:45 TO SEE Hailey BoozerLAURA INGOLD, NP    Any Other Special Instructions Will Be Listed Below (If Applicable).     If you need a refill on your cardiac medications before your next appointment, please call your pharmacy.

## 2018-03-20 NOTE — ED Triage Notes (Signed)
Pt here with c/o chest pain that started yesterday , pt had an elevated trop t. Out pt , pt has no chest pain at present

## 2018-03-21 ENCOUNTER — Other Ambulatory Visit: Payer: Self-pay

## 2018-03-21 DIAGNOSIS — I5043 Acute on chronic combined systolic (congestive) and diastolic (congestive) heart failure: Secondary | ICD-10-CM | POA: Diagnosis present

## 2018-03-21 DIAGNOSIS — I10 Essential (primary) hypertension: Secondary | ICD-10-CM

## 2018-03-21 DIAGNOSIS — E782 Mixed hyperlipidemia: Secondary | ICD-10-CM

## 2018-03-21 DIAGNOSIS — I25709 Atherosclerosis of coronary artery bypass graft(s), unspecified, with unspecified angina pectoris: Secondary | ICD-10-CM

## 2018-03-21 DIAGNOSIS — E669 Obesity, unspecified: Secondary | ICD-10-CM

## 2018-03-21 DIAGNOSIS — E1169 Type 2 diabetes mellitus with other specified complication: Secondary | ICD-10-CM

## 2018-03-21 LAB — CBC
HEMATOCRIT: 35.9 % — AB (ref 36.0–46.0)
HEMOGLOBIN: 11.2 g/dL — AB (ref 12.0–15.0)
MCH: 29.6 pg (ref 26.0–34.0)
MCHC: 31.2 g/dL (ref 30.0–36.0)
MCV: 95 fL (ref 78.0–100.0)
Platelets: 159 10*3/uL (ref 150–400)
RBC: 3.78 MIL/uL — ABNORMAL LOW (ref 3.87–5.11)
RDW: 16.8 % — ABNORMAL HIGH (ref 11.5–15.5)
WBC: 8.7 10*3/uL (ref 4.0–10.5)

## 2018-03-21 LAB — BASIC METABOLIC PANEL
ANION GAP: 18 — AB (ref 5–15)
BUN: 40 mg/dL — ABNORMAL HIGH (ref 6–20)
CHLORIDE: 101 mmol/L (ref 101–111)
CO2: 20 mmol/L — AB (ref 22–32)
Calcium: 9 mg/dL (ref 8.9–10.3)
Creatinine, Ser: 1.86 mg/dL — ABNORMAL HIGH (ref 0.44–1.00)
GFR calc non Af Amer: 26 mL/min — ABNORMAL LOW (ref >60)
GFR, EST AFRICAN AMERICAN: 30 mL/min — AB (ref >60)
Glucose, Bld: 100 mg/dL — ABNORMAL HIGH (ref 65–99)
Potassium: 3.6 mmol/L (ref 3.5–5.1)
Sodium: 139 mmol/L (ref 135–145)

## 2018-03-21 LAB — LIPID PANEL
CHOL/HDL RATIO: 2.7 ratio
Cholesterol: 138 mg/dL (ref 0–200)
HDL: 51 mg/dL (ref >40)
LDL CALC: 69 mg/dL (ref 0–99)
TRIGLYCERIDES: 90 mg/dL (ref <150)
VLDL: 18 mg/dL (ref 0–40)

## 2018-03-21 LAB — MRSA PCR SCREENING: MRSA by PCR: NEGATIVE

## 2018-03-21 LAB — GLUCOSE, CAPILLARY
GLUCOSE-CAPILLARY: 120 mg/dL — AB (ref 65–99)
GLUCOSE-CAPILLARY: 142 mg/dL — AB (ref 65–99)
Glucose-Capillary: 156 mg/dL — ABNORMAL HIGH (ref 65–99)
Glucose-Capillary: 136 mg/dL — ABNORMAL HIGH (ref 65–99)

## 2018-03-21 LAB — HEPARIN LEVEL (UNFRACTIONATED)
Heparin Unfractionated: 0.63 IU/mL (ref 0.30–0.70)
Heparin Unfractionated: 0.67 IU/mL (ref 0.30–0.70)

## 2018-03-21 LAB — CBG MONITORING, ED: Glucose-Capillary: 86 mg/dL (ref 65–99)

## 2018-03-21 LAB — TROPONIN I
Troponin I: 0.87 ng/mL (ref <0.03)
Troponin I: 0.59 ng/mL (ref <0.03)
Troponin I: 0.56 ng/mL (ref <0.03)

## 2018-03-21 LAB — PRO B NATRIURETIC PEPTIDE: NT-Pro BNP: 32943 pg/mL — ABNORMAL HIGH (ref 0–301)

## 2018-03-21 LAB — HEMOGLOBIN A1C
Hgb A1c MFr Bld: 5.8 % — ABNORMAL HIGH (ref 4.8–5.6)
Mean Plasma Glucose: 119.76 mg/dL

## 2018-03-21 MED ORDER — BISOPROLOL FUMARATE 5 MG PO TABS
5.0000 mg | ORAL_TABLET | Freq: Two times a day (BID) | ORAL | Status: DC
  Administered 2018-03-21 – 2018-03-22 (×4): 5 mg via ORAL
  Filled 2018-03-21 (×5): qty 1

## 2018-03-21 MED ORDER — ASPIRIN EC 81 MG PO TBEC
81.0000 mg | DELAYED_RELEASE_TABLET | Freq: Every day | ORAL | Status: DC
  Administered 2018-03-21 – 2018-04-06 (×17): 81 mg via ORAL
  Filled 2018-03-21 (×17): qty 1

## 2018-03-21 MED ORDER — ISOSORBIDE MONONITRATE ER 30 MG PO TB24
30.0000 mg | ORAL_TABLET | Freq: Every day | ORAL | Status: DC
  Administered 2018-03-21 – 2018-03-27 (×7): 30 mg via ORAL
  Filled 2018-03-21 (×7): qty 1

## 2018-03-21 MED ORDER — ATORVASTATIN CALCIUM 40 MG PO TABS
40.0000 mg | ORAL_TABLET | Freq: Every day | ORAL | Status: DC
  Administered 2018-03-21 – 2018-04-06 (×18): 40 mg via ORAL
  Filled 2018-03-21 (×18): qty 1

## 2018-03-21 MED ORDER — SACUBITRIL-VALSARTAN 24-26 MG PO TABS
1.0000 | ORAL_TABLET | Freq: Two times a day (BID) | ORAL | Status: DC
  Administered 2018-03-21 – 2018-03-22 (×5): 1 via ORAL
  Filled 2018-03-21 (×6): qty 1

## 2018-03-21 MED ORDER — ONDANSETRON HCL 4 MG/2ML IJ SOLN: 4.0000 mg | Freq: Four times a day (QID) | INTRAMUSCULAR | Status: DC | PRN

## 2018-03-21 MED ORDER — NITROGLYCERIN 0.4 MG SL SUBL: 0.4000 mg | SUBLINGUAL_TABLET | SUBLINGUAL | Status: DC | PRN

## 2018-03-21 MED ORDER — CYCLOSPORINE 0.05 % OP EMUL
1.0000 [drp] | Freq: Two times a day (BID) | OPHTHALMIC | Status: DC
  Administered 2018-03-21 – 2018-04-06 (×33): 1 [drp] via OPHTHALMIC
  Filled 2018-03-21 (×36): qty 1

## 2018-03-21 MED ORDER — FUROSEMIDE 10 MG/ML IJ SOLN
60.0000 mg | Freq: Once | INTRAMUSCULAR | Status: AC
  Administered 2018-03-21: 60 mg via INTRAVENOUS
  Filled 2018-03-21: qty 6

## 2018-03-21 MED ORDER — ALBUTEROL SULFATE (2.5 MG/3ML) 0.083% IN NEBU: 2.5000 mg | INHALATION_SOLUTION | Freq: Four times a day (QID) | RESPIRATORY_TRACT | Status: DC | PRN

## 2018-03-21 MED ORDER — PANTOPRAZOLE SODIUM 40 MG PO TBEC
40.0000 mg | DELAYED_RELEASE_TABLET | Freq: Every day | ORAL | Status: DC
  Administered 2018-03-21 – 2018-04-06 (×17): 40 mg via ORAL
  Filled 2018-03-21 (×17): qty 1

## 2018-03-21 MED ORDER — ALBUTEROL SULFATE HFA 108 (90 BASE) MCG/ACT IN AERS: 2.0000 | INHALATION_SPRAY | Freq: Four times a day (QID) | RESPIRATORY_TRACT | Status: DC | PRN

## 2018-03-21 MED ORDER — FLUTICASONE PROPIONATE 50 MCG/ACT NA SUSP
1.0000 | Freq: Every day | NASAL | Status: DC
  Administered 2018-03-21 – 2018-04-03 (×10): 1 via NASAL
  Filled 2018-03-21 (×2): qty 16

## 2018-03-21 MED ORDER — INSULIN ASPART 100 UNIT/ML ~~LOC~~ SOLN
0.0000 [IU] | Freq: Every day | SUBCUTANEOUS | Status: DC
  Administered 2018-03-25 – 2018-03-30 (×2): 2 [IU] via SUBCUTANEOUS
  Administered 2018-04-03: 3 [IU] via SUBCUTANEOUS

## 2018-03-21 MED ORDER — FAMOTIDINE 20 MG PO TABS
20.0000 mg | ORAL_TABLET | Freq: Every day | ORAL | Status: DC
  Administered 2018-03-21 – 2018-03-30 (×10): 20 mg via ORAL
  Filled 2018-03-21 (×11): qty 1

## 2018-03-21 MED ORDER — INSULIN ASPART 100 UNIT/ML ~~LOC~~ SOLN
0.0000 [IU] | Freq: Three times a day (TID) | SUBCUTANEOUS | Status: DC
  Administered 2018-03-21 (×2): 3 [IU] via SUBCUTANEOUS
  Administered 2018-03-22: 2 [IU] via SUBCUTANEOUS
  Administered 2018-03-22: 5 [IU] via SUBCUTANEOUS
  Administered 2018-03-22: 3 [IU] via SUBCUTANEOUS
  Administered 2018-03-23: 5 [IU] via SUBCUTANEOUS
  Administered 2018-03-23: 2 [IU] via SUBCUTANEOUS
  Administered 2018-03-24: 3 [IU] via SUBCUTANEOUS
  Administered 2018-03-24: 2 [IU] via SUBCUTANEOUS
  Administered 2018-03-25: 3 [IU] via SUBCUTANEOUS
  Administered 2018-03-25: 2 [IU] via SUBCUTANEOUS
  Administered 2018-03-25: 3 [IU] via SUBCUTANEOUS
  Administered 2018-03-27 (×2): 5 [IU] via SUBCUTANEOUS
  Administered 2018-03-27 – 2018-03-28 (×2): 3 [IU] via SUBCUTANEOUS
  Administered 2018-03-28: 5 [IU] via SUBCUTANEOUS
  Administered 2018-03-28: 8 [IU] via SUBCUTANEOUS
  Administered 2018-03-29 (×2): 5 [IU] via SUBCUTANEOUS
  Administered 2018-03-29: 3 [IU] via SUBCUTANEOUS
  Administered 2018-03-30: 5 [IU] via SUBCUTANEOUS
  Administered 2018-03-30 – 2018-03-31 (×3): 3 [IU] via SUBCUTANEOUS
  Administered 2018-03-31: 2 [IU] via SUBCUTANEOUS
  Administered 2018-04-01 – 2018-04-02 (×4): 3 [IU] via SUBCUTANEOUS
  Administered 2018-04-02: 2 [IU] via SUBCUTANEOUS
  Administered 2018-04-02 – 2018-04-03 (×2): 3 [IU] via SUBCUTANEOUS
  Administered 2018-04-03: 8 [IU] via SUBCUTANEOUS
  Administered 2018-04-03 – 2018-04-04 (×3): 5 [IU] via SUBCUTANEOUS
  Administered 2018-04-04: 3 [IU] via SUBCUTANEOUS
  Administered 2018-04-05 (×2): 5 [IU] via SUBCUTANEOUS
  Administered 2018-04-05 – 2018-04-06 (×2): 3 [IU] via SUBCUTANEOUS

## 2018-03-21 NOTE — Progress Notes (Signed)
ANTICOAGULATION CONSULT NOTE - Follow Up Consult  Pharmacy Consult for Heparin  Indication: chest pain/ACS  Allergies  Allergen Reactions  . Codeine Shortness Of Breath  . Coreg [Carvedilol] Shortness Of Breath and Cough  . Metoprolol Nausea And Vomiting    "Throws up white, foamy liquid"  . Percocet [Oxycodone-Acetaminophen] Nausea And Vomiting  . Hydrocodone Cough    This was in a cough syrup that made her cough WORSE  . Benazepril Cough   Vital Signs: Temp: 97.7 F (36.5 C) (03/30 1127) Temp Source: Oral (03/30 1127) BP: 138/71 (03/30 1127) Pulse Rate: 71 (03/30 1127)  Labs: Recent Labs    03/20/18 1534 03/20/18 1926 03/21/18 0135 03/21/18 0446 03/21/18 1211  HGB  --  12.2  --  11.2*  --   HCT  --  37.4  --  35.9*  --   PLT  --  190  --  159  --   HEPARINUNFRC  --   --   --  0.63 0.67  CREATININE 1.83* 1.84*  --  1.86*  --   TROPONINI  --   --  0.87* 0.59*  --     Estimated Creatinine Clearance: 21.9 mL/min (A) (by C-G formula based on SCr of 1.86 mg/dL (H)).   . heparin 650 Units/hr (03/21/18 1300)      Assessment: 72 yo female on IV heparin for NSTEMI.  Heparin level remains within goal range.  No overt bleeding or complications noted, CBC fairly stable.  Goal of Therapy:  Heparin level 0.3-0.7 units/ml Monitor platelets by anticoagulation protocol: Yes   Plan:  1. Continue IV heparin at current rate. 2. Daily CBC and heparin level. 3. F/u plans for heparin after R/LHC on Monday.  Tad MooreJessica Kolson Chovanec, Pharm D, BCPS  Clinical Pharmacist Pager [phone]  03/21/2018 1:54 PM

## 2018-03-21 NOTE — Progress Notes (Signed)
DAILY PROGRESS NOTE   Patient Name: Hailey Levine Date of Encounter: 03/21/2018  Chief Complaint   No chest pain overnight  Patient Profile   Hailey Levine is a 72 year old female with ahistory of CAD s/p MI and 3v CABG in 2002 with subsequent stenting 2003, HFrEF (EF 20%), HTN, HLD, DM, and CKD who presents after an episode of chest pain.  Subjective   No chest pain overnight. Troponins trending down from 1.1, 0.87 to 0.59, consistent with NSTEMI. NT-proBNP very high at 32,943 - No significant diuresis recorded overnight. On IV heparin. Weight 156 lbs today. BP elevated.  Objective   Vitals:   03/21/18 0200 03/21/18 0400 03/21/18 0500 03/21/18 0645  BP: (!) 150/137 (!) 154/93 136/66 (!) 158/78  Pulse: 62 62 61 74  Resp: (!) 22 17 (!) 21   Temp:    (!) 97.5 F (36.4 C)  TempSrc:    Oral  SpO2: 93% 94% 96% 100%  Weight:    156 lb 1.4 oz (70.8 kg)  Height:    '4\' 9"'  (1.448 m)    Intake/Output Summary (Last 24 hours) at 03/21/2018 1735 Last data filed at 03/21/2018 0645 Gross per 24 hour  Intake 57.42 ml  Output -  Net 57.42 ml   Filed Weights   03/21/18 0645  Weight: 156 lb 1.4 oz (70.8 kg)    Physical Exam   General appearance: alert, no distress and was up brushing dentures when I came in the room Neck: JVD - 3 cm above sternal notch, no carotid bruit and thyroid not enlarged, symmetric, no tenderness/mass/nodules Lungs: diminished breath sounds bilaterally Heart: regular rate and rhythm Abdomen: soft, non-tender; bowel sounds normal; no masses,  no organomegaly Extremities: edema trace Pulses: 2+ and symmetric Skin: Skin color, texture, turgor normal. No rashes or lesions Neurologic: Grossly normal Psych: Pleasant  Inpatient Medications    Scheduled Meds: . aspirin EC  81 mg Oral Daily  . atorvastatin  40 mg Oral Daily  . bisoprolol  5 mg Oral BID  . cycloSPORINE  1 drop Both Eyes BID  . famotidine  20 mg Oral Daily  . fluticasone  1 spray Each  Nare Daily  . insulin aspart  0-15 Units Subcutaneous TID WC  . insulin aspart  0-5 Units Subcutaneous QHS  . isosorbide mononitrate  30 mg Oral Daily  . pantoprazole  40 mg Oral Daily  . sacubitril-valsartan  1 tablet Oral BID    Continuous Infusions: . heparin 650 Units/hr (03/21/18 0645)    PRN Meds: albuterol, nitroGLYCERIN, ondansetron (ZOFRAN) IV   Labs   Results for orders placed or performed during the hospital encounter of 03/20/18 (from the past 48 hour(s))  Basic metabolic panel     Status: Abnormal   Collection Time: 03/20/18  7:26 PM  Result Value Ref Range   Sodium 138 135 - 145 mmol/L   Potassium 3.9 3.5 - 5.1 mmol/L   Chloride 98 (L) 101 - 111 mmol/L   CO2 21 (L) 22 - 32 mmol/L   Glucose, Bld 80 65 - 99 mg/dL   BUN 42 (H) 6 - 20 mg/dL   Creatinine, Ser 1.84 (H) 0.44 - 1.00 mg/dL   Calcium 9.2 8.9 - 10.3 mg/dL   GFR calc non Af Amer 26 (L) >60 mL/min   GFR calc Af Amer 30 (L) >60 mL/min    Comment: (NOTE) The eGFR has been calculated using the CKD EPI equation. This calculation has not been validated  in all clinical situations. eGFR's persistently <60 mL/min signify possible Chronic Kidney Disease.    Anion gap 19 (H) 5 - 15    Comment: Performed at [facility], [place] [address], [place], [place] [address]  CBC     Status: Abnormal   Collection Time: 03/20/18  7:26 PM  Result Value Ref Range   WBC 8.2 4.0 - 10.5 K/uL   RBC 3.94 3.87 - 5.11 MIL/uL   Hemoglobin 12.2 12.0 - 15.0 g/dL   HCT 37.4 36.0 - 46.0 %   MCV 94.9 78.0 - 100.0 fL   MCH 31.0 26.0 - 34.0 pg   MCHC 32.6 30.0 - 36.0 g/dL   RDW 16.8 (H) 11.5 - 15.5 %   Platelets 190 150 - 400 K/uL    Comment: Performed at [facility] [address] [place], [place] [address]  I-stat troponin, ED     Status: Abnormal   Collection Time: 03/20/18  7:34 PM  Result Value Ref Range   Troponin i, poc 1.10 (HH) 0.00 - 0.08 ng/mL   Comment NOTIFIED PHYSICIAN    Comment 3            Comment:  Due to the release kinetics of cTnI, a negative result within the first hours of the onset of symptoms does not rule out myocardial infarction with certainty. If myocardial infarction is still suspected, repeat the test at appropriate intervals.   Troponin I     Status: Abnormal   Collection Time: 03/21/18  1:35 AM  Result Value Ref Range   Troponin I 0.87 (HH) <0.03 ng/mL    Comment: CRITICAL RESULT CALLED TO, READ BACK BY AND VERIFIED WITH: [facility],RN 03/21/18 0305 WAYK Performed at [facility] [address] [place], [place] [address]   Hemoglobin A1c     Status: Abnormal   Collection Time: 03/21/18  1:35 AM  Result Value Ref Range   Hgb A1c MFr Bld 5.8 (H) 4.8 - 5.6 %    Comment: (NOTE) Pre diabetes:          5.7%-6.4% Diabetes:              >6.4% Glycemic control for   <7.0% adults with diabetes    Mean Plasma Glucose 119.76 mg/dL    Comment: Performed at [facility] [address] [place], [place] [address]  CBG monitoring, ED     Status: None   Collection Time: 03/21/18  2:50 AM  Result Value Ref Range   Glucose-Capillary 86 65 - 99 mg/dL  Heparin level (unfractionated)     Status: None   Collection Time: 03/21/18  4:46 AM  Result Value Ref Range   Heparin Unfractionated 0.63 0.30 - 0.70 IU/mL    Comment:        IF HEPARIN RESULTS ARE BELOW EXPECTED VALUES, AND PATIENT DOSAGE HAS BEEN CONFIRMED, SUGGEST FOLLOW UP TESTING OF ANTITHROMBIN III LEVELS. Performed at [facility], [place] [address] [place], [place] [address]   CBC     Status: Abnormal   Collection Time: 03/21/18  4:46 AM  Result Value Ref Range   WBC 8.7 4.0 - 10.5 K/uL   RBC 3.78 (L) 3.87 - 5.11 MIL/uL   Hemoglobin 11.2 (L) 12.0 - 15.0 g/dL   HCT 35.9 (L) 36.0 - 46.0 %   MCV 95.0 78.0 - 100.0 fL   MCH 29.6 26.0 - 34.0 pg   MCHC 31.2 30.0 - 36.0 g/dL  RDW 16.8 (H) 11.5 - 15.5 %   Platelets 159 150 - 400 K/uL    Comment: Performed at [facility], [place] [address] [place], [place] [address]  Troponin I     Status: Abnormal   Collection Time: 03/21/18  4:46 AM  Result Value Ref Range   Troponin I 0.59 (HH) <0.03 ng/mL    Comment: CRITICAL VALUE NOTED.  VALUE IS CONSISTENT WITH PREVIOUSLY REPORTED AND CALLED VALUE. Performed at [facility], [place] [address], [place], [place] [address]   Basic metabolic panel     Status: Abnormal   Collection Time: 03/21/18  4:46 AM  Result Value Ref Range   Sodium 139 135 - 145 mmol/L   Potassium 3.6 3.5 - 5.1 mmol/L   Chloride 101 101 - 111 mmol/L   CO2 20 (L) 22 - 32 mmol/L   Glucose, Bld 100 (H) 65 - 99 mg/dL   BUN 40 (H) 6 - 20 mg/dL   Creatinine, Ser 1.86 (H) 0.44 - 1.00 mg/dL   Calcium 9.0 8.9 - 10.3 mg/dL   GFR calc non Af Amer 26 (L) >60 mL/min   GFR calc Af Amer 30 (L) >60 mL/min    Comment: (NOTE) The eGFR has been calculated using the CKD EPI equation. This calculation has not been validated in all clinical situations. eGFR's persistently <60 mL/min signify possible Chronic Kidney Disease.    Anion gap 18 (H) 5 - 15    Comment: Performed at [facility], [place] [address] [place], [place] [address]  Lipid panel     Status: None   Collection Time: 03/21/18  4:46 AM  Result Value Ref Range   Cholesterol 138 0 - 200 mg/dL   Triglycerides 90 <150 mg/dL   HDL 51 >40 mg/dL   Total CHOL/HDL Ratio 2.7 RATIO   VLDL 18 0 - 40 mg/dL   LDL Cholesterol 69 0 - 99 mg/dL    Comment:        Total Cholesterol/HDL:CHD Risk Coronary Heart Disease Risk Table                     Men   Women  1/2 Average Risk   3.4   3.3  Average Risk       5.0   4.4  2 X Average Risk   9.6   7.1  3 X Average Risk  23.4   11.0        Use the calculated Patient Ratio above and the CHD Risk Table to determine the patient's CHD Risk.        ATP III CLASSIFICATION (LDL):  <100     mg/dL   Optimal  100-129  mg/dL   Near or Above                    Optimal  130-159  mg/dL   Borderline  160-189  mg/dL   High  >190      mg/dL   Very High Performed at [facility] [address] [place], [place] [address]   MRSA PCR Screening     Status: None   Collection Time: 03/21/18  7:15 AM  Result Value Ref Range   MRSA by PCR NEGATIVE NEGATIVE    Comment:        The GeneXpert MRSA Assay (FDA approved for NASAL specimens only), is one component of a comprehensive MRSA colonization surveillance program. It is not  intended to diagnose MRSA infection nor to guide or monitor treatment for MRSA infections. Performed at [facility], [place] [address], [place], [place] [address]   Glucose, capillary     Status: Abnormal   Collection Time: 03/21/18  8:21 AM  Result Value Ref Range   Glucose-Capillary 120 (H) 65 - 99 mg/dL   Comment 1 Notify RN    Comment 2 Document in Chart     ECG   N/A  Telemetry   Sinus - Personally Reviewed  Radiology    Dg Chest 2 View  Result Date: 03/20/2018 CLINICAL DATA:  Chest pain EXAM: CHEST - 2 VIEW COMPARISON:  01/28/2018 FINDINGS: Post sternotomy changes. Mild cardiomegaly with aortic atherosclerosis. Tiny pleural effusions on the lateral view. No focal consolidation. Mild bronchitic changes. No pneumothorax. Left shoulder replacement. Degenerative changes of the spine. IMPRESSION: 1. Tiny pleural effusions. 2. Cardiomegaly Electronically Signed   By: Donavan Foil M.D.   On: 03/20/2018 20:00    Cardiac Studies   LV EF: 20% -   25%  ------------------------------------------------------------------- Indications:      Dyspnea (R06.09).  ------------------------------------------------------------------- History:   PMH:   Coronary artery disease.  PMH:   Myocardial infarction.  Risk factors:  Chronic Kidney Disease  Family history of coronary artery disease. Former tobacco use. Hypertension. Diabetes mellitus. Dyslipidemia.  ------------------------------------------------------------------- Study Conclusions  - Left ventricle: The cavity size  was normal. Systolic function was   severely reduced. The estimated ejection fraction was in the   range of 20% to 25%. Wall motion was normal; there were no   regional wall motion abnormalities. Features are consistent with   a pseudonormal left ventricular filling pattern, with concomitant   abnormal relaxation and increased filling pressure (grade 2   diastolic dysfunction). Doppler parameters are consistent with   elevated ventricular end-diastolic filling pressure. - Mitral valve: There was mild regurgitation. - Left atrium: The atrium was mildly dilated. - Right ventricle: The cavity size was moderately dilated. Wall   thickness was normal. Systolic function was severely reduced. - Tricuspid valve: There was moderate regurgitation. - Pulmonary arteries: Systolic pressure was moderately increased.   PA peak pressure: 60 mm Hg (S).  Impressions:  - There is severe biventricular systolic dysfunction. 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 No thrombus is seen on Definity   echocontrast images.  Assessment   Principal Problem:   NSTEMI (non-ST elevated myocardial infarction) [facility]) Active Problems:   Coronary artery disease involving coronary bypass graft of native heart with angina pectoris ([place])   Essential hypertension   Hyperlipidemia   Diabetes mellitus type 2 in obese ([place])   CKD stage 3 due to type 2 diabetes mellitus ([place])   Acute on chronic combined systolic and diastolic CHF (congestive heart failure) ([place])   Plan   1. Feels better today - no chest pain overnight. Diuresed "a lot" - but not recorded. Will give additional IV lasix today. Troponin declining on heparin. Ok to eat today - plan R/LHC on Monday.  Time Spent Directly with Patient:  I have spent a total of 25 minutes with the patient reviewing hospital notes, telemetry, EKGs,  labs and examining the patient as well as establishing an assessment and plan that was discussed personally with the patient. > 50% of time was spent in direct patient care.  Length of Stay:  LOS: 1 day   Pixie Casino, MD, FACC, [place] Director of the [facility] Diplomate of the American Board of Clinical Lipidology Attending Cardiologist  Direct Dial: [phone]  Fax: [phone]  Website:  [email or web address] 03/21/2018, 9:56 AM

## 2018-03-21 NOTE — Progress Notes (Signed)
ANTICOAGULATION CONSULT NOTE - Follow Up Consult  Pharmacy Consult for Heparin  Indication: chest pain/ACS  Allergies  Allergen Reactions  . Codeine Shortness Of Breath  . Coreg [Carvedilol] Shortness Of Breath and Cough  . Metoprolol Nausea And Vomiting    "Throws up white, foamy liquid"  . Percocet [Oxycodone-Acetaminophen] Nausea And Vomiting  . Hydrocodone Cough    This was in a cough syrup that made her cough WORSE  . Benazepril Cough   Vital Signs: Temp: 97.9 F (36.6 C) (03/29 1851) Temp Source: Oral (03/29 1851) BP: 136/66 (03/30 0500) Pulse Rate: 61 (03/30 0500)  Labs: Recent Labs    03/20/18 1534 03/20/18 1926 03/21/18 0135 03/21/18 0446  HGB  --  12.2  --  11.2*  HCT  --  37.4  --  35.9*  PLT  --  190  --  159  HEPARINUNFRC  --   --   --  0.63  CREATININE 1.83* 1.84*  --   --   TROPONINI  --   --  0.87*  --     Estimated Creatinine Clearance: 22.7 mL/min (A) (by C-G formula based on SCr of 1.84 mg/dL (H)).   Assessment: Heparin for NSTEMI, initial heparin level is good, cards following   Goal of Therapy:  Heparin level 0.3-0.7 units/ml Monitor platelets by anticoagulation protocol: Yes   Plan:  Cont heparin at 650 units/hr 1200 confirmatory heparin level  Hailey Levine, Hailey Levine 03/21/2018,6:12 AM

## 2018-03-22 LAB — GLUCOSE, CAPILLARY
GLUCOSE-CAPILLARY: 202 mg/dL — AB (ref 65–99)
Glucose-Capillary: 164 mg/dL — ABNORMAL HIGH (ref 65–99)
Glucose-Capillary: 160 mg/dL — ABNORMAL HIGH (ref 65–99)
Glucose-Capillary: 114 mg/dL — ABNORMAL HIGH (ref 65–99)

## 2018-03-22 LAB — HEPARIN LEVEL (UNFRACTIONATED): HEPARIN UNFRACTIONATED: 0.49 [IU]/mL (ref 0.30–0.70)

## 2018-03-22 LAB — CBC
HEMATOCRIT: 36 % (ref 36.0–46.0)
Hemoglobin: 11.3 g/dL — ABNORMAL LOW (ref 12.0–15.0)
MCH: 29.7 pg (ref 26.0–34.0)
MCHC: 31.4 g/dL (ref 30.0–36.0)
MCV: 94.5 fL (ref 78.0–100.0)
PLATELETS: 167 10*3/uL (ref 150–400)
RBC: 3.81 MIL/uL — ABNORMAL LOW (ref 3.87–5.11)
RDW: 16.8 % — AB (ref 11.5–15.5)
WBC: 7.6 10*3/uL (ref 4.0–10.5)

## 2018-03-22 LAB — BASIC METABOLIC PANEL
ANION GAP: 16 — AB (ref 5–15)
BUN: 44 mg/dL — ABNORMAL HIGH (ref 6–20)
CALCIUM: 9.2 mg/dL (ref 8.9–10.3)
CO2: 23 mmol/L (ref 22–32)
CREATININE: 2.15 mg/dL — AB (ref 0.44–1.00)
Chloride: 100 mmol/L — ABNORMAL LOW (ref 101–111)
GFR calc Af Amer: 25 mL/min — ABNORMAL LOW (ref >60)
GFR, EST NON AFRICAN AMERICAN: 22 mL/min — AB (ref >60)
GLUCOSE: 175 mg/dL — AB (ref 65–99)
Potassium: 3.6 mmol/L (ref 3.5–5.1)
Sodium: 139 mmol/L (ref 135–145)

## 2018-03-22 MED ORDER — SODIUM CHLORIDE 0.9% FLUSH
3.0000 mL | Freq: Two times a day (BID) | INTRAVENOUS | Status: DC
  Administered 2018-03-22 – 2018-03-23 (×3): 3 mL via INTRAVENOUS

## 2018-03-22 MED ORDER — SODIUM CHLORIDE 0.9% FLUSH: 3.0000 mL | INTRAVENOUS | Status: DC | PRN

## 2018-03-22 MED ORDER — BISOPROLOL FUMARATE 5 MG PO TABS
7.5000 mg | ORAL_TABLET | Freq: Two times a day (BID) | ORAL | Status: DC
  Administered 2018-03-22 – 2018-03-24 (×4): 7.5 mg via ORAL
  Filled 2018-03-22 (×4): qty 2

## 2018-03-22 MED ORDER — SODIUM CHLORIDE 0.9 % IV SOLN: 250.0000 mL | INTRAVENOUS | Status: DC | PRN

## 2018-03-22 MED ORDER — SODIUM CHLORIDE 0.9 % IV SOLN
INTRAVENOUS | Status: DC
  Administered 2018-03-22: 30 mL/h via INTRAVENOUS

## 2018-03-22 NOTE — Progress Notes (Signed)
DAILY PROGRESS NOTE   Patient Name: Hailey Levine Date of Encounter: 03/22/2018  Chief Complaint   No further chest pain  Patient Profile   Ms. Freeburg is a 72 year old female with ahistory of CAD s/p MI and 3v CABG in 2002 with subsequent stenting 2003, HFrEF (EF 20%), HTN, HLD, DM, and CKD who presents after an episode of chest pain.  Subjective   Not recorded negative overnight, however, creatinine has risen to 2.15 (from 1.86). Weight not recorded today. BP elevated in 308-657 systolic range.  Objective   Vitals:   03/21/18 2340 03/22/18 0000 03/22/18 0300 03/22/18 0750  BP: 113/76  (!) 161/84 (!) 178/74  Pulse: 66  76 70  Resp: (!) '22 13 18 17  ' Temp: 98 F (36.7 C)  98.2 F (36.8 C) 98.7 F (37.1 C)  TempSrc: Oral  Oral Oral  SpO2: 96%  100% 100%  Weight:      Height:        Intake/Output Summary (Last 24 hours) at 03/22/2018 8469 Last data filed at 03/22/2018 0700 Gross per 24 hour  Intake 1032.13 ml  Output 325 ml  Net 707.13 ml   Filed Weights   03/21/18 0645  Weight: 156 lb 1.4 oz (70.8 kg)    Physical Exam   General appearance: alert, no distress and pleasant Neck: no carotid bruit, no JVD and thyroid not enlarged, symmetric, no tenderness/mass/nodules Lungs: clear to auscultation bilaterally Heart: regular rate and rhythm Abdomen: soft, non-tender; bowel sounds normal; no masses,  no organomegaly Extremities: extremities normal, atraumatic, no cyanosis or edema Pulses: 2+ and symmetric Skin: Skin color, texture, turgor normal. No rashes or lesions Neurologic: Grossly normal Psych: Pleasant  Inpatient Medications    Scheduled Meds: . aspirin EC  81 mg Oral Daily  . atorvastatin  40 mg Oral Daily  . bisoprolol  5 mg Oral BID  . cycloSPORINE  1 drop Both Eyes BID  . famotidine  20 mg Oral Daily  . fluticasone  1 spray Each Nare Daily  . insulin aspart  0-15 Units Subcutaneous TID WC  . insulin aspart  0-5 Units Subcutaneous QHS  .  isosorbide mononitrate  30 mg Oral Daily  . pantoprazole  40 mg Oral Daily  . sacubitril-valsartan  1 tablet Oral BID    Continuous Infusions: . heparin 650 Units/hr (03/22/18 0142)    PRN Meds: albuterol, nitroGLYCERIN, ondansetron (ZOFRAN) IV   Labs   Results for orders placed or performed during the hospital encounter of 03/20/18 (from the past 48 hour(s))  Basic metabolic panel     Status: Abnormal   Collection Time: 03/20/18  7:26 PM  Result Value Ref Range   Sodium 138 135 - 145 mmol/L   Potassium 3.9 3.5 - 5.1 mmol/L   Chloride 98 (L) 101 - 111 mmol/L   CO2 21 (L) 22 - 32 mmol/L   Glucose, Bld 80 65 - 99 mg/dL   BUN 42 (H) 6 - 20 mg/dL   Creatinine, Ser 1.84 (H) 0.44 - 1.00 mg/dL   Calcium 9.2 8.9 - 10.3 mg/dL   GFR calc non Af Amer 26 (L) >60 mL/min   GFR calc Af Amer 30 (L) >60 mL/min    Comment: (NOTE) The eGFR has been calculated using the CKD EPI equation. This calculation has not been validated in all clinical situations. eGFR's persistently <60 mL/min signify possible Chronic Kidney Disease.    Anion gap 19 (H) 5 - 15  Comment: Performed at [facility], [place] [address], [place], [place] [address]  CBC     Status: Abnormal   Collection Time: 03/20/18  7:26 PM  Result Value Ref Range   WBC 8.2 4.0 - 10.5 K/uL   RBC 3.94 3.87 - 5.11 MIL/uL   Hemoglobin 12.2 12.0 - 15.0 g/dL   HCT 37.4 36.0 - 46.0 %   MCV 94.9 78.0 - 100.0 fL   MCH 31.0 26.0 - 34.0 pg   MCHC 32.6 30.0 - 36.0 g/dL   RDW 16.8 (H) 11.5 - 15.5 %   Platelets 190 150 - 400 K/uL    Comment: Performed at [facility] [place] [address], [place], [place] [address]  I-stat troponin, ED     Status: Abnormal   Collection Time: 03/20/18  7:34 PM  Result Value Ref Range   Troponin i, poc 1.10 (HH) 0.00 - 0.08 ng/mL   Comment NOTIFIED PHYSICIAN    Comment 3            Comment: Due to the release kinetics of cTnI, a negative result within the first hours of the onset of symptoms does not  rule out myocardial infarction with certainty. If myocardial infarction is still suspected, repeat the test at appropriate intervals.   Troponin I     Status: Abnormal   Collection Time: 03/21/18  1:35 AM  Result Value Ref Range   Troponin I 0.87 (HH) <0.03 ng/mL    Comment: CRITICAL RESULT CALLED TO, READ BACK BY AND VERIFIED WITH: [facility] A,RN 03/21/18 0305 WAYK Performed at [place] [address], [place], [place] [address]   Hemoglobin A1c     Status: Abnormal   Collection Time: 03/21/18  1:35 AM  Result Value Ref Range   Hgb A1c MFr Bld 5.8 (H) 4.8 - 5.6 %    Comment: (NOTE) Pre diabetes:          5.7%-6.4% Diabetes:              >6.4% Glycemic control for   <7.0% adults with diabetes    Mean Plasma Glucose 119.76 mg/dL    Comment: Performed at [facility] [address] [place], [place] [address]  CBG monitoring, ED     Status: None   Collection Time: 03/21/18  2:50 AM  Result Value Ref Range   Glucose-Capillary 86 65 - 99 mg/dL  Heparin level (unfractionated)     Status: None   Collection Time: 03/21/18  4:46 AM  Result Value Ref Range   Heparin Unfractionated 0.63 0.30 - 0.70 IU/mL    Comment:        IF HEPARIN RESULTS ARE BELOW EXPECTED VALUES, AND PATIENT DOSAGE HAS BEEN CONFIRMED, SUGGEST FOLLOW UP TESTING OF ANTITHROMBIN III LEVELS. Performed at [facility], [place] [address] [place], [place] [address]   CBC     Status: Abnormal   Collection Time: 03/21/18  4:46 AM  Result Value Ref Range   WBC 8.7 4.0 - 10.5 K/uL   RBC 3.78 (L) 3.87 - 5.11 MIL/uL   Hemoglobin 11.2 (L) 12.0 - 15.0 g/dL   HCT 35.9 (L) 36.0 - 46.0 %   MCV 95.0 78.0 - 100.0 fL   MCH 29.6 26.0 - 34.0 pg   MCHC 31.2 30.0 - 36.0 g/dL   RDW 16.8 (H) 11.5 - 15.5 %   Platelets 159 150 - 400 K/uL    Comment: Performed at [facility], [address], [place], [place] [address]  Troponin I     Status: Abnormal   Collection Time: 03/21/18  4:46 AM  Result Value  Ref Range   Troponin I 0.59 (HH) <0.03 ng/mL    Comment: CRITICAL VALUE NOTED.  VALUE IS CONSISTENT WITH PREVIOUSLY REPORTED AND CALLED VALUE. Performed at [facility], [place] [address] [place], [place] [address]   Basic metabolic panel     Status: Abnormal   Collection Time: 03/21/18  4:46 AM  Result Value Ref Range   Sodium 139 135 - 145 mmol/L   Potassium 3.6 3.5 - 5.1 mmol/L   Chloride 101 101 - 111 mmol/L   CO2 20 (L) 22 - 32 mmol/L   Glucose, Bld 100 (H) 65 - 99 mg/dL   BUN 40 (H) 6 - 20 mg/dL   Creatinine, Ser 1.86 (H) 0.44 - 1.00 mg/dL   Calcium 9.0 8.9 - 10.3 mg/dL   GFR calc non Af Amer 26 (L) >60 mL/min   GFR calc Af Amer 30 (L) >60 mL/min    Comment: (NOTE) The eGFR has been calculated using the CKD EPI equation. This calculation has not been validated in all clinical situations. eGFR's persistently <60 mL/min signify possible Chronic Kidney Disease.    Anion gap 18 (H) 5 - 15    Comment: Performed at [facility], [place] [address] [place], [place] [address]  Lipid panel     Status: None   Collection Time: 03/21/18  4:46 AM  Result Value Ref Range   Cholesterol 138 0 - 200 mg/dL   Triglycerides 90 <150 mg/dL   HDL 51 >40 mg/dL   Total CHOL/HDL Ratio 2.7 RATIO   VLDL 18 0 - 40 mg/dL   LDL Cholesterol 69 0 - 99 mg/dL    Comment:        Total Cholesterol/HDL:CHD Risk Coronary Heart Disease Risk Table                     Men   Women  1/2 Average Risk   3.4   3.3  Average Risk       5.0   4.4  2 X Average Risk   9.6   7.1  3 X Average Risk  23.4   11.0        Use the calculated Patient Ratio above and the CHD Risk Table to determine the patient's CHD Risk.        ATP III CLASSIFICATION (LDL):  <100     mg/dL   Optimal  100-129  mg/dL   Near or Above                    Optimal  130-159  mg/dL   Borderline  160-189  mg/dL   High  >190     mg/dL   Very High Performed at [facility] [address] [place], [place] [address]   MRSA PCR  Screening     Status: None   Collection Time: 03/21/18  7:15 AM  Result Value Ref Range   MRSA by PCR NEGATIVE NEGATIVE    Comment:        The GeneXpert MRSA Assay (FDA approved for NASAL specimens only), is one component of a comprehensive MRSA colonization surveillance program. It is not intended to diagnose MRSA infection nor to guide or monitor treatment for MRSA infections. Performed at [facility], [place] [address]  [place], [place] [address]   Glucose, capillary     Status: Abnormal   Collection Time: 03/21/18  8:21 AM  Result Value Ref Range   Glucose-Capillary 120 (H) 65 - 99 mg/dL   Comment 1 Notify RN    Comment 2 Document in Chart   Troponin I     Status: Abnormal   Collection Time: 03/21/18 12:11 PM  Result Value Ref Range   Troponin I 0.56 (HH) <0.03 ng/mL    Comment: CRITICAL VALUE NOTED.  VALUE IS CONSISTENT WITH PREVIOUSLY REPORTED AND CALLED VALUE. Performed at [facility], [place] [address] [place], [place] [address]   Heparin level (unfractionated)     Status: None   Collection Time: 03/21/18 12:11 PM  Result Value Ref Range   Heparin Unfractionated 0.67 0.30 - 0.70 IU/mL    Comment:        IF HEPARIN RESULTS ARE BELOW EXPECTED VALUES, AND PATIENT DOSAGE HAS BEEN CONFIRMED, SUGGEST FOLLOW UP TESTING OF ANTITHROMBIN III LEVELS. Performed at [facility], [place] [address], [place], [place] [address]   Glucose, capillary     Status: Abnormal   Collection Time: 03/21/18 12:32 PM  Result Value Ref Range   Glucose-Capillary 156 (H) 65 - 99 mg/dL   Comment 1 Notify RN    Comment 2 Document in Chart   Glucose, capillary     Status: Abnormal   Collection Time: 03/21/18  5:13 PM  Result Value Ref Range   Glucose-Capillary 136 (H) 65 - 99 mg/dL   Comment 1 Notify RN    Comment 2 Document in Chart   Glucose, capillary     Status: Abnormal   Collection Time: 03/21/18  9:33 PM  Result Value Ref Range   Glucose-Capillary 142 (H) 65 - 99 mg/dL    Comment 1 Notify RN   Heparin level (unfractionated)     Status: None   Collection Time: 03/22/18  2:03 AM  Result Value Ref Range   Heparin Unfractionated 0.49 0.30 - 0.70 IU/mL    Comment:        IF HEPARIN RESULTS ARE BELOW EXPECTED VALUES, AND PATIENT DOSAGE HAS BEEN CONFIRMED, SUGGEST FOLLOW UP TESTING OF ANTITHROMBIN III LEVELS. Performed at [facility], [place] [address], [place], [place] [address]   CBC     Status: Abnormal   Collection Time: 03/22/18  2:03 AM  Result Value Ref Range   WBC 7.6 4.0 - 10.5 K/uL   RBC 3.81 (L) 3.87 - 5.11 MIL/uL   Hemoglobin 11.3 (L) 12.0 - 15.0 g/dL   HCT 36.0 36.0 - 46.0 %   MCV 94.5 78.0 - 100.0 fL   MCH 29.7 26.0 - 34.0 pg   MCHC 31.4 30.0 - 36.0 g/dL   RDW 16.8 (H) 11.5 - 15.5 %   Platelets 167 150 - 400 K/uL    Comment: Performed at [facility], [place] [address], [place],  [address]  Basic metabolic panel     Status: Abnormal   Collection Time: 03/22/18  2:03 AM  Result Value Ref Range   Sodium 139 135 - 145 mmol/L   Potassium 3.6 3.5 - 5.1 mmol/L   Chloride 100 (L) 101 - 111 mmol/L   CO2 23 22 - 32 mmol/L   Glucose, Bld 175 (H) 65 - 99 mg/dL   BUN 44 (H) 6 - 20 mg/dL   Creatinine, Ser 2.15 (H) 0.44 - 1.00 mg/dL   Calcium 9.2 8.9 - 10.3  mg/dL   GFR calc non Af Amer 22 (L) >60 mL/min   GFR calc Af Amer 25 (L) >60 mL/min    Comment: (NOTE) The eGFR has been calculated using the CKD EPI equation. This calculation has not been validated in all clinical situations. eGFR's persistently <60 mL/min signify possible Chronic Kidney Disease.    Anion gap 16 (H) 5 - 15    Comment: Performed at [facility], [place] [address], [place], [place] [address]  Glucose, capillary     Status: Abnormal   Collection Time: 03/22/18  7:49 AM  Result Value Ref Range   Glucose-Capillary 164 (H) 65 - 99 mg/dL   Comment 1 Notify RN    Comment 2 Document in Chart     ECG   N/A  Telemetry   Sinus rhythm - Personally  Reviewed  Radiology    Dg Chest 2 View  Result Date: 03/20/2018 CLINICAL DATA:  Chest pain EXAM: CHEST - 2 VIEW COMPARISON:  01/28/2018 FINDINGS: Post sternotomy changes. Mild cardiomegaly with aortic atherosclerosis. Tiny pleural effusions on the lateral view. No focal consolidation. Mild bronchitic changes. No pneumothorax. Left shoulder replacement. Degenerative changes of the spine. IMPRESSION: 1. Tiny pleural effusions. 2. Cardiomegaly Electronically Signed   By: Donavan Foil M.D.   On: 03/20/2018 20:00    Cardiac Studies   LV EF: 20% -   25%  ------------------------------------------------------------------- Indications:      Dyspnea (R06.09).  ------------------------------------------------------------------- History:   PMH:   Coronary artery disease.  PMH:   Myocardial infarction.  Risk factors:  Chronic Kidney Disease  Family history of coronary artery disease. Former tobacco use. Hypertension. Diabetes mellitus. Dyslipidemia.  ------------------------------------------------------------------- Study Conclusions  - Left ventricle: The cavity size was normal. Systolic function was   severely reduced. The estimated ejection fraction was in the   range of 20% to 25%. Wall motion was normal; there were no   regional wall motion abnormalities. Features are consistent with   a pseudonormal left ventricular filling pattern, with concomitant   abnormal relaxation and increased filling pressure (grade 2   diastolic dysfunction). Doppler parameters are consistent with   elevated ventricular end-diastolic filling pressure. - Mitral valve: There was mild regurgitation. - Left atrium: The atrium was mildly dilated. - Right ventricle: The cavity size was moderately dilated. Wall   thickness was normal. Systolic function was severely reduced. - Tricuspid valve: There was moderate regurgitation. - Pulmonary arteries: Systolic pressure was moderately increased.   PA peak  pressure: 60 mm Hg (S).  Impressions:  - There is severe biventricular systolic dysfunction. 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 No thrombus is seen on Definity   echocontrast images.  Assessment   Principal Problem:   NSTEMI (non-ST elevated myocardial infarction) [facility]) Active Problems:   Coronary artery disease involving coronary bypass graft of native heart with angina pectoris ([place])   Essential hypertension   Hyperlipidemia   Diabetes mellitus type 2 in obese ([place])   CKD stage 3 due to type 2 diabetes mellitus ([place])   Acute on chronic combined systolic and diastolic CHF (congestive heart failure) ([place])   Plan   1. Creatinine rising with diuresis. Appears euvolemic. Will hold additional diuretic today. Gentle hydration starting tonight [facility]. Increase bisoprolol to 7.5 mg BID.  Time Spent Directly with Patient:  I have spent a  total of 25 minutes with the patient reviewing hospital notes, telemetry, EKGs, labs and examining the patient as well as establishing an assessment and plan that was discussed personally with the patient. > 50% of time was spent in direct patient care.  Length of Stay:  LOS: 2 days   Pixie Casino, MD, [facility], [place] Director of the Advanced Lipid Disorders &  Cardiovascular Risk Reduction Clinic Diplomate of the American Board of Clinical Lipidology Attending Cardiologist  Direct Dial: [phone]  Fax: [phone]  Website:  [email or web address] 03/22/2018, 9:27 AM

## 2018-03-22 NOTE — Progress Notes (Signed)
ANTICOAGULATION CONSULT NOTE - Follow Up Consult  Pharmacy Consult for Heparin  Indication: chest pain/ACS  Allergies  Allergen Reactions  . Codeine Shortness Of Breath  . Coreg [Carvedilol] Shortness Of Breath and Cough  . Metoprolol Nausea And Vomiting    "Throws up white, foamy liquid"  . Percocet [Oxycodone-Acetaminophen] Nausea And Vomiting  . Hydrocodone Cough    This was in a cough syrup that made her cough WORSE  . Benazepril Cough   Vital Signs: Temp: 98.2 F (36.8 C) (03/31 0300) Temp Source: Oral (03/31 0300) BP: 161/84 (03/31 0300) Pulse Rate: 76 (03/31 0300)  Labs: Recent Labs    03/20/18 1926 03/21/18 0135 03/21/18 0446 03/21/18 1211 03/22/18 0203  HGB 12.2  --  11.2*  --  11.3*  HCT 37.4  --  35.9*  --  36.0  PLT 190  --  159  --  167  HEPARINUNFRC  --   --  0.63 0.67 0.49  CREATININE 1.84*  --  1.86*  --  2.15*  TROPONINI  --  0.87* 0.59* 0.56*  --     Estimated Creatinine Clearance: 18.9 mL/min (A) (by C-G formula based on SCr of 2.15 mg/dL (H)).   . heparin 650 Units/hr (03/22/18 0142)      Assessment: 72 yo female on IV heparin for NSTEMI.  Heparin level remains within goal range.  No overt bleeding or complications noted, CBC fairly stable.  Awaiting cath lab tomorrow.  Goal of Therapy:  Heparin level 0.3-0.7 units/ml Monitor platelets by anticoagulation protocol: Yes   Plan:  1. Continue IV heparin at current rate. 2. Daily CBC and heparin level. 3. F/u plans for heparin after R/LHC on Monday.  Tad MooreJessica Kemuel Buchmann, Pharm D, BCPS  Clinical Pharmacist Pager [phone]  03/22/2018 7:35 AM

## 2018-03-23 LAB — BASIC METABOLIC PANEL
Anion gap: 16 — ABNORMAL HIGH (ref 5–15)
Anion gap: 16 — ABNORMAL HIGH (ref 5–15)
BUN: 49 mg/dL — AB (ref 6–20)
BUN: 50 mg/dL — AB (ref 6–20)
CALCIUM: 8.8 mg/dL — AB (ref 8.9–10.3)
CALCIUM: 9.3 mg/dL (ref 8.9–10.3)
CHLORIDE: 98 mmol/L — AB (ref 101–111)
CO2: 21 mmol/L — AB (ref 22–32)
CO2: 21 mmol/L — AB (ref 22–32)
CREATININE: 2.2 mg/dL — AB (ref 0.44–1.00)
CREATININE: 2.07 mg/dL — AB (ref 0.44–1.00)
Chloride: 100 mmol/L — ABNORMAL LOW (ref 101–111)
GFR calc Af Amer: 24 mL/min — ABNORMAL LOW (ref >60)
GFR calc non Af Amer: 21 mL/min — ABNORMAL LOW (ref >60)
GFR calc non Af Amer: 23 mL/min — ABNORMAL LOW (ref >60)
GFR, EST AFRICAN AMERICAN: 26 mL/min — AB (ref >60)
Glucose, Bld: 179 mg/dL — ABNORMAL HIGH (ref 65–99)
Glucose, Bld: 134 mg/dL — ABNORMAL HIGH (ref 65–99)
Potassium: 3.8 mmol/L (ref 3.5–5.1)
Potassium: 4.1 mmol/L (ref 3.5–5.1)
SODIUM: 135 mmol/L (ref 135–145)
SODIUM: 137 mmol/L (ref 135–145)

## 2018-03-23 LAB — GLUCOSE, CAPILLARY
GLUCOSE-CAPILLARY: 146 mg/dL — AB (ref 65–99)
GLUCOSE-CAPILLARY: 213 mg/dL — AB (ref 65–99)
Glucose-Capillary: 130 mg/dL — ABNORMAL HIGH (ref 65–99)
Glucose-Capillary: 195 mg/dL — ABNORMAL HIGH (ref 65–99)

## 2018-03-23 LAB — CBC
HEMATOCRIT: 34.9 % — AB (ref 36.0–46.0)
HEMOGLOBIN: 11.1 g/dL — AB (ref 12.0–15.0)
MCH: 29.7 pg (ref 26.0–34.0)
MCHC: 31.8 g/dL (ref 30.0–36.0)
MCV: 93.3 fL (ref 78.0–100.0)
Platelets: 175 10*3/uL (ref 150–400)
RBC: 3.74 MIL/uL — ABNORMAL LOW (ref 3.87–5.11)
RDW: 16.6 % — AB (ref 11.5–15.5)
WBC: 8.7 10*3/uL (ref 4.0–10.5)

## 2018-03-23 LAB — PROTIME-INR
INR: 1.17
Prothrombin Time: 14.8 seconds (ref 11.4–15.2)

## 2018-03-23 LAB — HEPARIN LEVEL (UNFRACTIONATED): HEPARIN UNFRACTIONATED: 0.43 [IU]/mL (ref 0.30–0.70)

## 2018-03-23 MED ORDER — HYDRALAZINE HCL 50 MG PO TABS
25.0000 mg | ORAL_TABLET | Freq: Three times a day (TID) | ORAL | Status: DC
  Administered 2018-03-23 – 2018-03-25 (×6): 25 mg via ORAL
  Filled 2018-03-23 (×6): qty 1

## 2018-03-23 MED ORDER — SODIUM CHLORIDE 0.9 % IV SOLN: INTRAVENOUS | Status: DC

## 2018-03-23 MED ORDER — PROBENECID 500 MG PO TABS
500.0000 mg | ORAL_TABLET | Freq: Two times a day (BID) | ORAL | Status: DC
  Administered 2018-03-23 – 2018-03-28 (×10): 500 mg via ORAL
  Filled 2018-03-23 (×11): qty 1

## 2018-03-23 NOTE — Progress Notes (Signed)
ANTICOAGULATION CONSULT NOTE - Follow Up Consult  Pharmacy Consult for Heparin  Indication: chest pain/ACS  Allergies  Allergen Reactions  . Codeine Shortness Of Breath  . Coreg [Carvedilol] Shortness Of Breath and Cough  . Metoprolol Nausea And Vomiting    "Throws up white, foamy liquid"  . Percocet [Oxycodone-Acetaminophen] Nausea And Vomiting  . Hydrocodone Cough    This was in a cough syrup that made her cough WORSE  . Benazepril Cough   Vital Signs: Temp: 98.5 F (36.9 C) (04/01 0803) Temp Source: Oral (04/01 0803) BP: 175/89 (04/01 0831) Pulse Rate: 73 (04/01 0410)  Labs: Recent Labs    03/21/18 0135  03/21/18 0446 03/21/18 1211 03/22/18 0203 03/23/18 0230 03/23/18 0732  HGB  --   --  11.2*  --  11.3* 11.1*  --   HCT  --   --  35.9*  --  36.0 34.9*  --   PLT  --   --  159  --  167 175  --   LABPROT  --   --   --   --   --   --  14.8  INR  --   --   --   --   --   --  1.17  HEPARINUNFRC  --    < > 0.63 0.67 0.49 0.43  --   CREATININE  --   --  1.86*  --  2.15* 2.20*  --   TROPONINI 0.87*  --  0.59* 0.56*  --   --   --    < > = values in this interval not displayed.    Estimated Creatinine Clearance: 18.5 mL/min (A) (by C-G formula based on SCr of 2.2 mg/dL (H)).   . sodium chloride    . heparin 650 Units/hr (03/23/18 0400)      Assessment: 72 yo female on IV heparin for NSTEMI.  Heparin level (0.43) remains within goal range.  No overt bleeding or complications noted, CBC fairly stable. Planed for Cath today, but delayed now d/t scr trending up. Reschedule 4/2 if renal fx is better.  Goal of Therapy:  Heparin level 0.3-0.7 units/ml Monitor platelets by anticoagulation protocol: Yes   Plan:  1. Continue IV heparin at current rate. 2. Daily CBC and heparin level. 3. F/u plans for heparin after R/LHC on Tuesday.  Bayard HuggerMei Zamirah Denny, PharmD, BCPS  Clinical Pharmacist  Pager: [phone]    03/23/2018 10:27 AM

## 2018-03-23 NOTE — Progress Notes (Addendum)
Progress Note  Patient Name: Hailey JonesMarilyn P Mainor Date of Encounter: 03/23/2018  Primary Cardiologist: Lyn RecordsHenry W Smith III, MD  Subjective   No chest pain, breathing is stable. Planned for cath today, but Cr continues to rise.   Inpatient Medications    Scheduled Meds: . aspirin EC  81 mg Oral Daily  . atorvastatin  40 mg Oral Daily  . bisoprolol  7.5 mg Oral BID  . cycloSPORINE  1 drop Both Eyes BID  . famotidine  20 mg Oral Daily  . fluticasone  1 spray Each Nare Daily  . insulin aspart  0-15 Units Subcutaneous TID WC  . insulin aspart  0-5 Units Subcutaneous QHS  . isosorbide mononitrate  30 mg Oral Daily  . pantoprazole  40 mg Oral Daily  . sacubitril-valsartan  1 tablet Oral BID  . sodium chloride flush  3 mL Intravenous Q12H   Continuous Infusions: . sodium chloride    . sodium chloride 30 mL/hr at 03/23/18 0400  . heparin 650 Units/hr (03/23/18 0400)   PRN Meds: sodium chloride, albuterol, nitroGLYCERIN, ondansetron (ZOFRAN) IV, sodium chloride flush   Vital Signs    Vitals:   03/22/18 2347 03/23/18 0000 03/23/18 0410 03/23/18 0803  BP: (!) 155/75  (!) 172/74 (!) 157/122  Pulse: 73  73   Resp:  (!) 28 (!) 24 20  Temp: 98.4 F (36.9 C)  97.8 F (36.6 C) 98.5 F (36.9 C)  TempSrc: Oral  Oral Oral  SpO2: 100%  93%   Weight:      Height:        Intake/Output Summary (Last 24 hours) at 03/23/2018 0908 Last data filed at 03/23/2018 0400 Gross per 24 hour  Intake 985 ml  Output 250 ml  Net 735 ml   Filed Weights   03/21/18 0645  Weight: 156 lb 1.4 oz (70.8 kg)    Telemetry    SR - Personally Reviewed  ECG    N/a - Personally Reviewed  Physical Exam   General: Well developed, well nourished, older W female appearing in no acute distress. Head: Normocephalic, atraumatic.  Neck: Supple, no JVD. Lungs:  Resp regular and unlabored, CTA. Heart: RRR, S1, S2, no murmur; no rub. Abdomen: Soft, non-tender, non-distended with normoactive bowel sounds.    Extremities: No clubbing, cyanosis, 1+ LE edema. Distal pedal pulses are 2+ bilaterally. Neuro: Alert and oriented X 3. Moves all extremities spontaneously. Psych: Normal affect.  Labs    Chemistry Recent Labs  Lab 03/21/18 0446 03/22/18 0203 03/23/18 0230  NA 139 139 135  K 3.6 3.6 3.8  CL 101 100* 98*  CO2 20* 23 21*  GLUCOSE 100* 175* 179*  BUN 40* 44* 49*  CREATININE 1.86* 2.15* 2.20*  CALCIUM 9.0 9.2 8.8*  GFRNONAA 26* 22* 21*  GFRAA 30* 25* 24*  ANIONGAP 18* 16* 16*     Hematology Recent Labs  Lab 03/21/18 0446 03/22/18 0203 03/23/18 0230  WBC 8.7 7.6 8.7  RBC 3.78* 3.81* 3.74*  HGB 11.2* 11.3* 11.1*  HCT 35.9* 36.0 34.9*  MCV 95.0 94.5 93.3  MCH 29.6 29.7 29.7  MCHC 31.2 31.4 31.8  RDW 16.8* 16.8* 16.6*  PLT 159 167 175    Cardiac Enzymes Recent Labs  Lab 03/21/18 0135 03/21/18 0446 03/21/18 1211  TROPONINI 0.87* 0.59* 0.56*    Recent Labs  Lab 03/20/18 1934  TROPIPOC 1.10*     BNP Recent Labs  Lab 03/20/18 1534  PROBNP 32,943*  DDimer No results for input(s): DDIMER in the last 168 hours.    Radiology    No results found.  Cardiac Studies   LV EF: 20% - 25%  ------------------------------------------------------------------- Indications: Dyspnea (R06.09).  ------------------------------------------------------------------- History: PMH: Coronary artery disease. PMH: Myocardial infarction. Risk factors: Chronic Kidney Disease  Family history of coronary artery disease. Former tobacco use. Hypertension. Diabetes mellitus. Dyslipidemia.  ------------------------------------------------------------------- Study Conclusions  - Left ventricle: The cavity size was normal. Systolic function was severely reduced. The estimated ejection fraction was in the range of 20% to 25%. Wall motion was normal; there were no regional wall motion abnormalities. Features are consistent with a pseudonormal  left ventricular filling pattern, with concomitant abnormal relaxation and increased filling pressure (grade 2 diastolic dysfunction). Doppler parameters are consistent with elevated ventricular end-diastolic filling pressure. - Mitral valve: There was mild regurgitation. - Left atrium: The atrium was mildly dilated. - Right ventricle: The cavity size was moderately dilated. Wall thickness was normal. Systolic function was severely reduced. - Tricuspid valve: There was moderate regurgitation. - Pulmonary arteries: Systolic pressure was moderately increased. PA peak pressure: 60 mm Hg (S).  Impressions:  - There is severe biventricular systolic dysfunction. 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 No thrombus is seen on Definity echocontrast images.  Patient Profile     72 y.o. female with ahistory of CAD s/p MI and 3v CABG in 2002 with subsequent stenting 2003, HFrEF (EF 20%), HTN, HLD, DM, and CKD who presented with chest pain and new systolic HF.   Assessment & Plan    1. NSTEMI with Hx of CABG ('02): Trop peaked at 1.10. Remains on IV heparin. No chest pain. Planned for cath today but Cr continues to rise today. Started on gentle IV fluids last night. Will hold IV fluids and diuretic as her respiratory status is stable this morning. Will recheck Cr this afternoon. Plan for [facility] in the am pending CR. Patient updated on plan.   2. Acute systolic HF: EF noted at 20-25% with severe biventricular systolic dysfunction. No thrombus noted on Echo. Has been diuresed with IV lasix, held this morning. Was started on Entresto but Cr continues to rise, will hold today.  3. AKI: Cr baseline 1.7-1.8, continues to rise at 2.2 today. Recommendations noted above.   4. HTN: Blood pressure remains elevated. On bisoprolol, will add hydralazine today given plan to  hold Entresto.   5. DM: home agents held, on SSI   6. HL: on high dose statin, LDL 69  Signed, Laverda Page, NP  03/23/2018, 9:08 AM  Pager # [phone]   For questions or updates, please contact [facility] Please consult [email or web address] for contact info under Cardiology/STEMI.  I have personally seen and examined this patient. I agree with the assessment and plan as outlined above.  She is admitted with a NSTEMI. History of CAD s/p CABG. NO chest pain this am. Creatinine is still rising but appears to be stable today. Lasix will be held today. Entresto on hold. Will repeat BMET in am. If creatinine is stable, will proceed with cardiac cath tomorrow.   Verne Carrow 03/23/2018 11:01 AM

## 2018-03-23 NOTE — H&P (View-Only) (Signed)
Progress Note  Patient Name: Hailey Levine Date of Encounter: 03/23/2018  Primary Cardiologist: Lyn RecordsHenry W Smith III, MD  Subjective   No chest pain, breathing is stable. Planned for cath today, but Cr continues to rise.   Inpatient Medications    Scheduled Meds: . aspirin EC  81 mg Oral Daily  . atorvastatin  40 mg Oral Daily  . bisoprolol  7.5 mg Oral BID  . cycloSPORINE  1 drop Both Eyes BID  . famotidine  20 mg Oral Daily  . fluticasone  1 spray Each Nare Daily  . insulin aspart  0-15 Units Subcutaneous TID WC  . insulin aspart  0-5 Units Subcutaneous QHS  . isosorbide mononitrate  30 mg Oral Daily  . pantoprazole  40 mg Oral Daily  . sacubitril-valsartan  1 tablet Oral BID  . sodium chloride flush  3 mL Intravenous Q12H   Continuous Infusions: . sodium chloride    . sodium chloride 30 mL/hr at 03/23/18 0400  . heparin 650 Units/hr (03/23/18 0400)   PRN Meds: sodium chloride, albuterol, nitroGLYCERIN, ondansetron (ZOFRAN) IV, sodium chloride flush   Vital Signs    Vitals:   03/22/18 2347 03/23/18 0000 03/23/18 0410 03/23/18 0803  BP: (!) 155/75  (!) 172/74 (!) 157/122  Pulse: 73  73   Resp:  (!) 28 (!) 24 20  Temp: 98.4 F (36.9 C)  97.8 F (36.6 C) 98.5 F (36.9 C)  TempSrc: Oral  Oral Oral  SpO2: 100%  93%   Weight:      Height:        Intake/Output Summary (Last 24 hours) at 03/23/2018 0908 Last data filed at 03/23/2018 0400 Gross per 24 hour  Intake 985 ml  Output 250 ml  Net 735 ml   Filed Weights   03/21/18 0645  Weight: 156 lb 1.4 oz (70.8 kg)    Telemetry    SR - Personally Reviewed  ECG    N/a - Personally Reviewed  Physical Exam   General: Well developed, well nourished, older W female appearing in no acute distress. Head: Normocephalic, atraumatic.  Neck: Supple, no JVD. Lungs:  Resp regular and unlabored, CTA. Heart: RRR, S1, S2, no murmur; no rub. Abdomen: Soft, non-tender, non-distended with normoactive bowel sounds.    Extremities: No clubbing, cyanosis, 1+ LE edema. Distal pedal pulses are 2+ bilaterally. Neuro: Alert and oriented X 3. Moves all extremities spontaneously. Psych: Normal affect.  Labs    Chemistry Recent Labs  Lab 03/21/18 0446 03/22/18 0203 03/23/18 0230  NA 139 139 135  K 3.6 3.6 3.8  CL 101 100* 98*  CO2 20* 23 21*  GLUCOSE 100* 175* 179*  BUN 40* 44* 49*  CREATININE 1.86* 2.15* 2.20*  CALCIUM 9.0 9.2 8.8*  GFRNONAA 26* 22* 21*  GFRAA 30* 25* 24*  ANIONGAP 18* 16* 16*     Hematology Recent Labs  Lab 03/21/18 0446 03/22/18 0203 03/23/18 0230  WBC 8.7 7.6 8.7  RBC 3.78* 3.81* 3.74*  HGB 11.2* 11.3* 11.1*  HCT 35.9* 36.0 34.9*  MCV 95.0 94.5 93.3  MCH 29.6 29.7 29.7  MCHC 31.2 31.4 31.8  RDW 16.8* 16.8* 16.6*  PLT 159 167 175    Cardiac Enzymes Recent Labs  Lab 03/21/18 0135 03/21/18 0446 03/21/18 1211  TROPONINI 0.87* 0.59* 0.56*    Recent Labs  Lab 03/20/18 1934  TROPIPOC 1.10*     BNP Recent Labs  Lab 03/20/18 1534  PROBNP 32,943*  DDimer No results for input(s): DDIMER in the last 168 hours.    Radiology    No results found.  Cardiac Studies   LV EF: 20% - 25%  ------------------------------------------------------------------- Indications: Dyspnea (R06.09).  ------------------------------------------------------------------- History: PMH: Coronary artery disease. PMH: Myocardial infarction. Risk factors: Chronic Kidney Disease  Family history of coronary artery disease. Former tobacco use. Hypertension. Diabetes mellitus. Dyslipidemia.  ------------------------------------------------------------------- Study Conclusions  - Left ventricle: The cavity size was normal. Systolic function was severely reduced. The estimated ejection fraction was in the range of 20% to 25%. Wall motion was normal; there were no regional wall motion abnormalities. Features are consistent with a pseudonormal  left ventricular filling pattern, with concomitant abnormal relaxation and increased filling pressure (grade 2 diastolic dysfunction). Doppler parameters are consistent with elevated ventricular end-diastolic filling pressure. - Mitral valve: There was mild regurgitation. - Left atrium: The atrium was mildly dilated. - Right ventricle: The cavity size was moderately dilated. Wall thickness was normal. Systolic function was severely reduced. - Tricuspid valve: There was moderate regurgitation. - Pulmonary arteries: Systolic pressure was moderately increased. PA peak pressure: 60 mm Hg (S).  Impressions:  - There is severe biventricular systolic dysfunction. 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 No thrombus is seen on Definity echocontrast images.  Patient Profile     72 y.o. female with ahistory of CAD s/p MI and 3v CABG in 2002 with subsequent stenting 2003, HFrEF (EF 20%), HTN, HLD, DM, and CKD who presented with chest pain and new systolic HF.   Assessment & Plan    1. NSTEMI with Hx of CABG ('02): Trop peaked at 1.10. Remains on IV heparin. No chest pain. Planned for cath today but Cr continues to rise today. Started on gentle IV fluids last night. Will hold IV fluids and diuretic as her respiratory status is stable this morning. Will recheck Cr this afternoon. Plan for [facility] in the am pending CR. Patient updated on plan.   2. Acute systolic HF: EF noted at 20-25% with severe biventricular systolic dysfunction. No thrombus noted on Echo. Has been diuresed with IV lasix, held this morning. Was started on Entresto but Cr continues to rise, will hold today.  3. AKI: Cr baseline 1.7-1.8, continues to rise at 2.2 today. Recommendations noted above.   4. HTN: Blood pressure remains elevated. On bisoprolol, will add hydralazine today given plan to  hold Entresto.   5. DM: home agents held, on SSI   6. HL: on high dose statin, LDL 69  Signed, Laverda Page, NP  03/23/2018, 9:08 AM  Pager # [phone]   For questions or updates, please contact [facility] Please consult [email or web address] for contact info under Cardiology/STEMI.  I have personally seen and examined this patient. I agree with the assessment and plan as outlined above.  She is admitted with a NSTEMI. History of CAD s/p CABG. NO chest pain this am. Creatinine is still rising but appears to be stable today. Lasix will be held today. Entresto on hold. Will repeat BMET in am. If creatinine is stable, will proceed with cardiac cath tomorrow.   Verne Carrow 03/23/2018 11:01 AM

## 2018-03-24 ENCOUNTER — Inpatient Hospital Stay: Payer: Self-pay

## 2018-03-24 ENCOUNTER — Encounter (HOSPITAL_COMMUNITY): Payer: Self-pay | Admitting: Cardiovascular Disease

## 2018-03-24 ENCOUNTER — Inpatient Hospital Stay (HOSPITAL_COMMUNITY)
Admission: EM | Disposition: A | Discharge status: Hospice | Payer: Self-pay | Source: Home / Self Care | Attending: Vascular Surgery

## 2018-03-24 DIAGNOSIS — R570 Cardiogenic shock: Secondary | ICD-10-CM

## 2018-03-24 HISTORY — PX: RIGHT/LEFT HEART CATH AND CORONARY/GRAFT ANGIOGRAPHY: CATH118267

## 2018-03-24 LAB — POCT I-STAT 3, ART BLOOD GAS (G3+)
ACID-BASE DEFICIT: 1 mmol/L (ref 0.0–2.0)
Bicarbonate: 23.4 mmol/L (ref 20.0–28.0)
O2 SAT: 93 %
PCO2 ART: 36.1 mmHg (ref 32.0–48.0)
PO2 ART: 66 mmHg — AB (ref 83.0–108.0)
TCO2: 24 mmol/L (ref 22–32)
pH, Arterial: 7.42 (ref 7.350–7.450)

## 2018-03-24 LAB — CBC
HCT: 33.1 % — ABNORMAL LOW (ref 36.0–46.0)
Hemoglobin: 10.3 g/dL — ABNORMAL LOW (ref 12.0–15.0)
MCH: 29.3 pg (ref 26.0–34.0)
MCHC: 31.1 g/dL (ref 30.0–36.0)
MCV: 94.3 fL (ref 78.0–100.0)
Platelets: 154 10*3/uL (ref 150–400)
RBC: 3.51 MIL/uL — AB (ref 3.87–5.11)
RDW: 16.6 % — ABNORMAL HIGH (ref 11.5–15.5)
WBC: 6.7 10*3/uL (ref 4.0–10.5)

## 2018-03-24 LAB — GLUCOSE, CAPILLARY
GLUCOSE-CAPILLARY: 139 mg/dL — AB (ref 65–99)
Glucose-Capillary: 166 mg/dL — ABNORMAL HIGH (ref 65–99)
Glucose-Capillary: 169 mg/dL — ABNORMAL HIGH (ref 65–99)
Glucose-Capillary: 157 mg/dL — ABNORMAL HIGH (ref 65–99)
Glucose-Capillary: 174 mg/dL — ABNORMAL HIGH (ref 65–99)

## 2018-03-24 LAB — BASIC METABOLIC PANEL
Anion gap: 16 — ABNORMAL HIGH (ref 5–15)
BUN: 51 mg/dL — AB (ref 6–20)
CHLORIDE: 100 mmol/L — AB (ref 101–111)
CO2: 22 mmol/L (ref 22–32)
Calcium: 8.8 mg/dL — ABNORMAL LOW (ref 8.9–10.3)
Creatinine, Ser: 2.09 mg/dL — ABNORMAL HIGH (ref 0.44–1.00)
GFR calc Af Amer: 26 mL/min — ABNORMAL LOW (ref >60)
GFR calc non Af Amer: 22 mL/min — ABNORMAL LOW (ref >60)
Glucose, Bld: 175 mg/dL — ABNORMAL HIGH (ref 65–99)
Potassium: 4 mmol/L (ref 3.5–5.1)
Sodium: 138 mmol/L (ref 135–145)

## 2018-03-24 LAB — POCT I-STAT 3, VENOUS BLOOD GAS (G3P V)
ACID-BASE DEFICIT: 1 mmol/L (ref 0.0–2.0)
Bicarbonate: 24.6 mmol/L (ref 20.0–28.0)
O2 SAT: 48 %
PH VEN: 7.363 (ref 7.250–7.430)
TCO2: 26 mmol/L (ref 22–32)
pCO2, Ven: 43.2 mmHg — ABNORMAL LOW (ref 44.0–60.0)
pO2, Ven: 27 mmHg — CL (ref 32.0–45.0)

## 2018-03-24 LAB — POCT ACTIVATED CLOTTING TIME: ACTIVATED CLOTTING TIME: 125 s

## 2018-03-24 LAB — HEPARIN LEVEL (UNFRACTIONATED): HEPARIN UNFRACTIONATED: 0.27 [IU]/mL — AB (ref 0.30–0.70)

## 2018-03-24 LAB — COOXEMETRY PANEL
CARBOXYHEMOGLOBIN: 1 % (ref 0.5–1.5)
Methemoglobin: 1.5 % (ref 0.0–1.5)
O2 SAT: 47.1 %
TOTAL HEMOGLOBIN: 11.2 g/dL — AB (ref 12.0–16.0)

## 2018-03-24 SURGERY — RIGHT/LEFT HEART CATH AND CORONARY/GRAFT ANGIOGRAPHY
Anesthesia: LOCAL

## 2018-03-24 MED ORDER — HEPARIN (PORCINE) IN NACL 2-0.9 UNIT/ML-% IJ SOLN
INTRAMUSCULAR | Status: AC | PRN
  Administered 2018-03-24 (×2): 500 mL via INTRA_ARTERIAL

## 2018-03-24 MED ORDER — SODIUM CHLORIDE 0.9% FLUSH: 3.0000 mL | INTRAVENOUS | Status: DC | PRN

## 2018-03-24 MED ORDER — FUROSEMIDE 10 MG/ML IJ SOLN
80.0000 mg | Freq: Two times a day (BID) | INTRAMUSCULAR | Status: DC
  Administered 2018-03-24 – 2018-03-27 (×4): 80 mg via INTRAVENOUS
  Filled 2018-03-24 (×6): qty 8

## 2018-03-24 MED ORDER — SODIUM CHLORIDE 0.9 % IV SOLN: INTRAVENOUS | Status: AC

## 2018-03-24 MED ORDER — ACETAMINOPHEN 325 MG PO TABS
650.0000 mg | ORAL_TABLET | ORAL | Status: DC | PRN
  Administered 2018-03-26 – 2018-04-04 (×5): 650 mg via ORAL
  Filled 2018-03-24 (×5): qty 2

## 2018-03-24 MED ORDER — LIDOCAINE HCL (PF) 1 % IJ SOLN
INTRAMUSCULAR | Status: AC
  Filled 2018-03-24: qty 30

## 2018-03-24 MED ORDER — LIDOCAINE HCL (PF) 1 % IJ SOLN
INTRAMUSCULAR | Status: DC | PRN
  Administered 2018-03-24: 20 mL

## 2018-03-24 MED ORDER — DIAZEPAM 5 MG PO TABS
5.0000 mg | ORAL_TABLET | Freq: Four times a day (QID) | ORAL | Status: DC | PRN
  Administered 2018-03-25 – 2018-04-05 (×11): 5 mg via ORAL
  Filled 2018-03-24 (×11): qty 1

## 2018-03-24 MED ORDER — HEPARIN (PORCINE) IN NACL 2-0.9 UNIT/ML-% IJ SOLN
INTRAMUSCULAR | Status: AC
  Filled 2018-03-24: qty 500

## 2018-03-24 MED ORDER — FENTANYL CITRATE (PF) 100 MCG/2ML IJ SOLN
INTRAMUSCULAR | Status: DC | PRN
  Administered 2018-03-24: 25 ug via INTRAVENOUS

## 2018-03-24 MED ORDER — MIDAZOLAM HCL 2 MG/2ML IJ SOLN
INTRAMUSCULAR | Status: AC
  Filled 2018-03-24: qty 2

## 2018-03-24 MED ORDER — FENTANYL CITRATE (PF) 100 MCG/2ML IJ SOLN
INTRAMUSCULAR | Status: AC
  Filled 2018-03-24: qty 2

## 2018-03-24 MED ORDER — HEPARIN SODIUM (PORCINE) 5000 UNIT/ML IJ SOLN
5000.0000 [IU] | Freq: Three times a day (TID) | INTRAMUSCULAR | Status: DC
  Administered 2018-03-25 – 2018-03-28 (×8): 5000 [IU] via SUBCUTANEOUS
  Filled 2018-03-24 (×8): qty 1

## 2018-03-24 MED ORDER — SODIUM CHLORIDE 0.9 % IV SOLN: 250.0000 mL | INTRAVENOUS | Status: DC | PRN

## 2018-03-24 MED ORDER — SODIUM CHLORIDE 0.9 % IV SOLN
INTRAVENOUS | Status: DC
  Administered 2018-03-24: 07:00:00 via INTRAVENOUS

## 2018-03-24 MED ORDER — ONDANSETRON HCL 4 MG/2ML IJ SOLN: 4.0000 mg | Freq: Four times a day (QID) | INTRAMUSCULAR | Status: DC | PRN

## 2018-03-24 MED ORDER — MIDAZOLAM HCL 2 MG/2ML IJ SOLN
INTRAMUSCULAR | Status: DC | PRN
  Administered 2018-03-24: 1 mg via INTRAVENOUS

## 2018-03-24 MED ORDER — SODIUM CHLORIDE 0.9% FLUSH
3.0000 mL | Freq: Two times a day (BID) | INTRAVENOUS | Status: DC
  Administered 2018-03-25 – 2018-03-26 (×3): 3 mL via INTRAVENOUS

## 2018-03-24 MED ORDER — SODIUM CHLORIDE 0.9% FLUSH
10.0000 mL | Freq: Two times a day (BID) | INTRAVENOUS | Status: DC
  Administered 2018-03-24: 20 mL
  Administered 2018-03-25 – 2018-03-29 (×7): 10 mL
  Administered 2018-03-29: 20 mL
  Administered 2018-03-30 – 2018-03-31 (×2): 10 mL

## 2018-03-24 MED ORDER — SODIUM CHLORIDE 0.9% FLUSH: 10.0000 mL | INTRAVENOUS | Status: DC | PRN

## 2018-03-24 MED ORDER — ASPIRIN 81 MG PO CHEW: 81.0000 mg | CHEWABLE_TABLET | Freq: Every day | ORAL | Status: DC

## 2018-03-24 SURGICAL SUPPLY — 12 items
CATH INFINITI 5 FR LCB (CATHETERS) ×1 IMPLANT
CATH INFINITI MULTIPACK ST 5F (CATHETERS) ×1 IMPLANT
CATH SWAN GANZ 7F STRAIGHT (CATHETERS) ×1 IMPLANT
KIT HEART LEFT (KITS) ×2 IMPLANT
PACK CARDIAC CATHETERIZATION (CUSTOM PROCEDURE TRAY) ×2 IMPLANT
SHEATH AVANTI 11CM 5FR (SHEATH) ×1 IMPLANT
SHEATH AVANTI 11CM 7FR (SHEATH) ×1 IMPLANT
TRANSDUCER W/STOPCOCK (MISCELLANEOUS) ×3 IMPLANT
TUBING ART PRESS 72  MALE/FEM (TUBING) ×1
TUBING ART PRESS 72 MALE/FEM (TUBING) IMPLANT
WIRE EMERALD 3MM-J .025X260CM (WIRE) ×1 IMPLANT
WIRE EMERALD 3MM-J .035X150CM (WIRE) ×1 IMPLANT

## 2018-03-24 NOTE — Care Management Important Message (Signed)
Important Message  Patient Details  Name: Hailey JonesMarilyn P Koos MRN: [id number] Date of Birth: 10/17/1946   Medicare Important Message Given:  Yes    Dorena BodoIris Whit Bruni 03/24/2018, 2:36 PM

## 2018-03-24 NOTE — Interval H&P Note (Signed)
Cath Lab Visit (complete for each Cath Lab visit)  Clinical Evaluation Leading to the Procedure:   ACS: No.  Non-ACS:    Anginal Classification: CCS III  Anti-ischemic medical therapy: Maximal Therapy (2 or more classes of medications)  Non-Invasive Test Results: No non-invasive testing performed  Prior CABG: No previous CABG      History and Physical Interval Note:  03/24/2018 8:59 AM  Hailey Levine  has presented today for surgery, with the diagnosis of NSTEMI  The various methods of treatment have been discussed with the patient and family. After consideration of risks, benefits and other options for treatment, the patient has consented to  Procedure(s): RIGHT/LEFT HEART CATH AND CORONARY ANGIOGRAPHY (N/A) as a surgical intervention .  The patient's history has been reviewed, patient examined, no change in status, stable for surgery.  I have reviewed the patient's chart and labs.  Questions were answered to the patient's satisfaction.     Hailey Levine

## 2018-03-24 NOTE — Progress Notes (Signed)
Peripherally Inserted Central Catheter/Midline Placement  The IV Nurse has discussed with the patient and/or persons authorized to consent for the patient, the purpose of this procedure and the potential benefits and risks involved with this procedure.  The benefits include less needle sticks, lab draws from the catheter, and the patient may be discharged home with the catheter. Risks include, but not limited to, infection, bleeding, blood clot (thrombus formation), and puncture of an artery; nerve damage and irregular heartbeat and possibility to perform a PICC exchange if needed/ordered by physician.  Alternatives to this procedure were also discussed.  Bard Power PICC patient education guide, fact sheet on infection prevention and patient information card has been provided to patient /or left at bedside.    PICC/Midline Placement Documentation  PICC Double Lumen 03/24/18 PICC Right Basilic 40 cm 1 cm (Active)  Indication for Insertion or Continuance of Line Home intravenous therapies (PICC only) 03/24/2018  8:51 PM  Exposed Catheter (cm) 0 cm 03/24/2018  8:51 PM  Site Assessment Clean;Dry;Intact 03/24/2018  8:51 PM  Lumen #1 Status Flushed;Saline locked;Blood return noted 03/24/2018  8:51 PM  Lumen #2 Status Flushed;Saline locked;Blood return noted 03/24/2018  8:51 PM  Dressing Type Transparent 03/24/2018  8:51 PM  Dressing Status Clean;Dry;Intact 03/24/2018  8:51 PM  Dressing Change Due 03/31/18 03/24/2018  8:51 PM       Ethelda Chick 03/24/2018, 8:53 PM

## 2018-03-24 NOTE — Consult Note (Signed)
Advanced Heart Failure Team Consult Note   Primary Physician: Lenell Antu, DO PCP-Cardiologist:  Lesleigh Noe, MD  Reason for Consultation: Heart Failure   HPI:    Hailey Levine is seen today for evaluation of heart failure at the request of Dr Excell Seltzer.   Ms Bertucci is a 72 year old with a history of CAD S/P CABG x 3 in 2002 with stent 2003, chronic systolic heart failure, HTN, hyperlipidemia, DM, and CKD.   Admitted with chest pain. CXR with tiny effusion and cardiomegaly. Pertinent admission labs included: K 3.9, creatinine 1.84 (baseline 1.7-1.9), Hgb 12.2, hgb 8.2, troponin 1.1, pro bnp 2115. She has been diuresing with IV lasix.  Today she underwent RHC/LHC as noted below with elevated filling pressures and low cardiac output.   Despite cath findings, she says she feels fine. Going to [employer] 3x/week doing exercises without problem. Has mild edema and orthopnea but she doesn't feel it is severe. Says she is doing "pretty darn good." However, she does admit that she uses a lift chair for steps and can't walk to mailbox which is 180 feet away.  Echo 02/2018  EF 20-25% Grade IIDD Peak PA pressure 60 mm hg  Ost Cx to Prox Cx lesion is 55% stenosed.  Prox RCA to Mid RCA lesion is 100% stenosed.  Ost 2nd Diag to 2nd Diag lesion is 75% stenosed.  Mid LAD lesion is 100% stenosed.  Prox LAD lesion is 80% stenosed.  RPDA lesion is 99% stenosed.  Origin lesion is 100% stenosed.  Origin to Prox Graft lesion is 100% stenosed.  Hemodynamic findings consistent with severe pulmonary hypertension. RA: A-wave 27, V-wave 25; mean 23 RV: 86/30 PA: 82/38; mean 55 PW: A-wave 32, V-wave 38; mean 31 AO: 154/61 PA: 83/35 LV: 162/32 PW: A-wave 35, V-wave 39; mean 34 LV: 165/35 AO: 169/73 Oxygen saturation in the aorta was 93% and in the pulmonary artery was 48%. Cardiac output by the thermodilution  2.8 Cardiac index by the thermodilution  1.8 PVR: 8.48 Woods units  with HRU index 13.74 Review of Systems: [y] = yes, [ ]  = no   General: Weight gain [ ] ; Weight loss [ ] ; Anorexia [ ] ; Fatigue [ ] ; Fever [ ] ; Chills [ ] ; Weakness [ ]   Cardiac: Chest pain/pressure [ ] ; Resting SOB [ ] ; Exertional SOB [ y]; Orthopnea [ ] ; Pedal Edema Cove.Etienne ]; Palpitations [ ] ; Syncope [ ] ; Presyncope [ ] ; Paroxysmal nocturnal dyspnea[ y]  Pulmonary: Cough [ ] ; Wheezing[ ] ; Hemoptysis[ ] ; Sputum [ ] ; Snoring [ ]   GI: Vomiting[ ] ; Dysphagia[ ] ; Melena[ ] ; Hematochezia [ ] ; Heartburn[ ] ; Abdominal pain [ ] ; Constipation [ ] ; Diarrhea [ ] ; BRBPR [ ]   GU: Hematuria[ ] ; Dysuria [ ] ; Nocturia[ ]   Vascular: Pain in legs with walking Cove.Etienne ]; Pain in feet with lying flat [ ] ; Non-healing sores [ ] ; Stroke [ ] ; TIA [ ] ; Slurred speech [ ] ;  Neuro: Headaches[ ] ; Vertigo[ ] ; Seizures[ ] ; Paresthesias[ ] ;Blurred vision [ ] ; Diplopia [ ] ; Vision changes [ ]   Ortho/Skin: Arthritis Cove.Etienne ]; Joint pain Cove.Etienne ]; Muscle pain [ ] ; Joint swelling [ ] ; Back Pain [ ] ; Rash [ ]   Psych: Depression[ ] ; Anxiety[ ]   Heme: Bleeding problems [ ] ; Clotting disorders [ ] ; Anemia [ ]   Endocrine: Diabetes Cove.Etienne ]; Thyroid dysfunction[ ]   Home Medications Prior to Admission medications   Medication Sig Start Date End Date Taking? Authorizing Provider  aspirin 81 MG tablet Take 81 mg by mouth daily.   Yes [provider]  atorvastatin (LIPITOR) 40 MG tablet Take 40 mg by mouth at bedtime.    Yes [provider]  bisoprolol (ZEBETA) 5 MG tablet One twice daily Patient taking differently: Take 5 mg by mouth 2 (two) times daily.  03/02/18  Yes Nyoka CowdenWert, Michael B, MD  cycloSPORINE (RESTASIS) 0.05 % ophthalmic emulsion Place 1 drop into both eyes 2 (two) times daily.   Yes [provider]  fluticasone (FLONASE) 50 MCG/ACT nasal spray Place 1 spray into both nostrils at bedtime.    Yes [provider]  furosemide (LASIX) 80 MG tablet Take one tablet by mouth every morning.  Take 1/2 tablet (40mg ) by  mouth every evening. Patient taking differently: 40-80 mg See admin instructions. Take 80 mg by mouth in the morning and 40 mg in the late afternoon 03/13/18  Yes Lyn RecordsSmith, Henry W, MD  glimepiride (AMARYL) 1 MG tablet Take 1 mg by mouth daily with breakfast.   Yes [provider]  metFORMIN (GLUCOPHAGE-XR) 500 MG 24 hr tablet Take 1,000 mg by mouth daily.    Yes [provider]  nitroGLYCERIN (NITROSTAT) 0.4 MG SL tablet Place 0.4 mg under the tongue every 5 (five) minutes x 3 doses as needed. (chest pain) 01/08/16  Yes [provider]  Omega-3 Fatty Acids (OMEGA 3 PO) Take 2 capsules by mouth daily.    Yes [provider]  pantoprazole (PROTONIX) 40 MG tablet Take 1 tablet (40 mg total) by mouth daily. Take 30-60 min before first meal of the day 03/02/18  Yes Nyoka CowdenWert, Michael B, MD  probenecid (BENEMID) 500 MG tablet Take 500 mg by mouth 2 (two) times daily.   Yes [provider]  sacubitril-valsartan (ENTRESTO) 24-26 MG Take 1 tablet by mouth 2 (two) times daily. 03/13/18  Yes Lyn RecordsSmith, Henry W, MD  albuterol (PROVENTIL HFA;VENTOLIN HFA) [id number](90 Base) MCG/ACT inhaler Inhale 2 puffs into the lungs every six hours as needed for shortness of breath or wheezing 01/29/18   [provider]  isosorbide mononitrate (IMDUR) 30 MG 24 hr tablet Take 1 tablet (30 mg total) by mouth daily. 03/20/18 06/18/18  Leone BrandIngold, Laura R, NP    Past Medical History: Past Medical History:  Diagnosis Date  . Arthritis   . CKD (chronic kidney disease)   . Coronary artery disease   . Depression   . Diabetes mellitus without complication (HCC)   . Hyperlipidemia   . Hypertension   . Myocardial infarction (HCC) 2002   MI x 2 - one prior to 2002    Past Surgical History: Past Surgical History:  Procedure Laterality Date  . ABDOMINAL HYSTERECTOMY    . CAROTID ENDARTERECTOMY Left 05/21/2001  . CHOLECYSTECTOMY    . CORONARY ANGIOPLASTY WITH STENT PLACEMENT  2003  . CORONARY  ARTERY BYPASS GRAFT  2002  . EYE SURGERY Bilateral    cataract  . OVARIAN CYST REMOVAL    . REVERSE SHOULDER ARTHROPLASTY Left 04/05/2014   Procedure: LEFT REVERSE SHOULDER ARTHROPLASTY;  Surgeon: Senaida LangeKevin M Supple, MD;  Location: [facility] OR;  Service: Orthopedics;  Laterality: Left;  . RIGHT/LEFT HEART CATH AND CORONARY/GRAFT ANGIOGRAPHY N/A 03/24/2018   Procedure: RIGHT/LEFT HEART CATH AND CORONARY/GRAFT ANGIOGRAPHY;  Surgeon: Lennette BihariKelly, Thomas A, MD;  Location: [facility] INVASIVE CV [facility];  Service: Cardiovascular;  Laterality: N/A;  . TUBAL LIGATION      Family History: Family History  Problem Relation Age of Onset  .  Heart attack Father 66    Social History: Social History   Socioeconomic History  . Marital status: Widowed    Spouse name: Not on file  . Number of children: Not on file  . Years of education: Not on file  . Highest education level: Not on file  Occupational History  . Occupation: retired  Engineer, production  . Financial resource strain: Not on file  . Food insecurity:    Worry: Not on file    Inability: Not on file  . Transportation needs:    Medical: Not on file    Non-medical: Not on file  Tobacco Use  . Smoking status: Former Smoker    Packs/day: 0.75    Years: 36.00    Pack years: 27.00    Types: Cigarettes    Last attempt to quit: 12/16/1998    Years since quitting: 19.2  . Smokeless tobacco: Never Used  Substance and Sexual Activity  . Alcohol use: Yes    Alcohol/week: 0.0 oz    Comment: strawberry dacquiri once a year  . Drug use: No  . Sexual activity: Not on file  Lifestyle  . Physical activity:    Days per week: Not on file    Minutes per session: Not on file  . Stress: Not on file  Relationships  . Social connections:    Talks on phone: Not on file    Gets together: Not on file    Attends religious service: Not on file    Active member of club or organization: Not on file    Attends meetings of clubs or organizations: Not on file    Relationship status:  Not on file  Other Topics Concern  . Not on file  Social History Narrative  . Not on file    Allergies:  Allergies  Allergen Reactions  . Codeine Shortness Of Breath  . Coreg [Carvedilol] Shortness Of Breath and Cough  . Metoprolol Nausea And Vomiting    "Throws up white, foamy liquid"  . Percocet [Oxycodone-Acetaminophen] Nausea And Vomiting  . Hydrocodone Cough    This was in a cough syrup that made her cough WORSE  . Benazepril Cough    Objective:    Vital Signs:   Temp:  [97.5 F (36.4 C)-98.6 F (37 C)] 97.5 F (36.4 C) (04/02 1309) Pulse Rate:  [0-127] 71 (04/02 1400) Resp:  [0-27] 15 (04/02 1400) BP: (115-184)/(63-114) 139/66 (04/02 1400) SpO2:  [0 %-100 %] 99 % (04/02 1400) Last BM Date: 03/21/18  Weight change: Filed Weights   03/21/18 0645  Weight: 156 lb 1.4 oz (70.8 kg)    Intake/Output:   Intake/Output Summary (Last 24 hours) at 03/24/2018 1517 Last data filed at 03/24/2018 1500 Gross per 24 hour  Intake 26 ml  Output 450 ml  Net -424 ml      Physical Exam    General:  Elderly Lying almost flat in bed  No resp difficulty HEENT: normal Neck: supple. JVP hard to see but appears elevated to ear . Carotids 2+ bilat; no bruits. No lymphadenopathy or thyromegaly appreciated. Cor: PMI nondisplaced. Regular rate & rhythm. No rubs, gallops or murmurs. Lungs: clear Abdomen: obese soft, nontender, nondistended. No hepatosplenomegaly. No bruits or masses. Good bowel sounds. Extremities: no cyanosis, clubbing, rash, 1+ edema Neuro: alert & orientedx3, cranial nerves grossly intact. moves all 4 extremities w/o difficulty. Affect pleasant   Telemetry   NSR 70-80s Personally reviewed   EKG    NSR 72 bpm  Anterolateral Qs Personally reviewed   Labs   Basic Metabolic Panel: Recent Labs  Lab 03/21/18 0446 03/22/18 0203 03/23/18 0230 03/23/18 1608 03/24/18 0205  NA 139 139 135 137 138  K 3.6 3.6 3.8 4.1 4.0  CL 101 100* 98* 100* 100*  CO2 20*  23 21* 21* 22  GLUCOSE 100* 175* 179* 134* 175*  BUN 40* 44* 49* 50* 51*  CREATININE 1.86* 2.15* 2.20* 2.07* 2.09*  CALCIUM 9.0 9.2 8.8* 9.3 8.8*    Liver Function Tests: No results for input(s): AST, ALT, ALKPHOS, BILITOT, PROT, ALBUMIN in the last 168 hours. No results for input(s): LIPASE, AMYLASE in the last 168 hours. No results for input(s): AMMONIA in the last 168 hours.  CBC: Recent Labs  Lab 03/20/18 1926 03/21/18 0446 03/22/18 0203 03/23/18 0230 03/24/18 0205  WBC 8.2 8.7 7.6 8.7 6.7  HGB 12.2 11.2* 11.3* 11.1* 10.3*  HCT 37.4 35.9* 36.0 34.9* 33.1*  MCV 94.9 95.0 94.5 93.3 94.3  PLT 190 159 167 175 154    Cardiac Enzymes: Recent Labs  Lab 03/21/18 0135 03/21/18 0446 03/21/18 1211  TROPONINI 0.87* 0.59* 0.56*    BNP: BNP (last 3 results) No results for input(s): BNP in the last 8760 hours.  ProBNP (last 3 results) Recent Labs    11/07/17 1214 03/02/18 1724 03/20/18 1534  PROBNP 11,244* 2,115.0* 32,943*     CBG: Recent Labs  Lab 03/23/18 1632 03/23/18 2217 03/24/18 0745 03/24/18 1015 03/24/18 1202  GLUCAP 130* 195* 166* 169* 157*    Coagulation Studies: Recent Labs    03/23/18 0732  LABPROT 14.8  INR 1.17     Imaging    No results found.   Medications:     Current Medications: . aspirin EC  81 mg Oral Daily  . atorvastatin  40 mg Oral Daily  . bisoprolol  7.5 mg Oral BID  . cycloSPORINE  1 drop Both Eyes BID  . famotidine  20 mg Oral Daily  . fluticasone  1 spray Each Nare Daily  . [START ON 03/25/2018] heparin  5,000 Units Subcutaneous Q8H  . hydrALAZINE  25 mg Oral Q8H  . insulin aspart  0-15 Units Subcutaneous TID WC  . insulin aspart  0-5 Units Subcutaneous QHS  . isosorbide mononitrate  30 mg Oral Daily  . pantoprazole  40 mg Oral Daily  . probenecid  500 mg Oral BID  . sodium chloride flush  3 mL Intravenous Q12H     Infusions: . sodium chloride 75 mL/hr at 03/24/18 0718  . sodium chloride 50 mL/hr at  03/24/18 1055  . sodium chloride         Patient Profile  Ms Cregan is a 72 year old with a history of CAD S/P CABG x 3 in 2002 with stent 2003, chronic systolic heart failure, htn, hyperlipidemia, DM, and CKD.   Admitted with chest pain.   Assessment/Plan   1. Unstable angina - cath with severe 3vCAD with 2/3 SVGs occluded - no revasc options 2. A/C Systolic Heart Failure  - EF 20-25% due to ICM  - RHC with markedly elevated biventricular filling pressures and low cardiac output - Start IV lasix - Hold bb for now  - Continue Entresto.  - No dig with CKD  - Will place PICC for co-ox and CVP. Low threshold to start inotropes.  3. Hyperlipidemia  -Contine atorvastatin.  4. HTN - Elevated. Titrate HF meds  5. CKD Stage IV - Creatinine baseline 1.7-1.9  -Follow  up BMET closely.   Medication concerns reviewed with patient and pharmacy team. Barriers identified:   Length of Stay: 4  Amy Clegg, NP  03/24/2018, 3:17 PM  Advanced Heart Failure Team Pager [phone] (M-F; 7a - 4p)  Please contact [facility] for night-coverage after hours (4p -7a ) and weekends on [email or web address]  Patient seen and examined with Tonye Becket, NP. We discussed all aspects of the encounter. I agree with the assessment and plan as stated above.   72 y/o female with DM2, CKD IV, CAD s/p remote CABG and systolic HF EF 20-25% admitted with unstable angina. Cath today reviewed personally. Showed severe 3vCAD with 2/3 SVGs occluded. no revasc options. RHC with markedly elevated biventricular filling pressures and low cardiac output. However symptomatically she says she feels pretty good though actual functional capacities don't seems as good as she reports.  On exam Lying almost flat in bed JVP up Cor RRR no s3 Lungs clear Ab obese  Ext 1+ edema.   Will start IV diuresis. Place PICC to follow co-ox and CVP. Follow renal function closely. Low threshold to start milrinone though I would not push too hard  as she is doing relatively well at home. No candidate for VAD or transplant with age and CKD.   Arvilla Meres, MD  4:52 PM

## 2018-03-24 NOTE — Progress Notes (Signed)
Site area: rt groin fa and fv sheaths pulled and pressure held by Lynett Grimes Site Prior to Removal:  Level 0 Pressure Applied For: 25 minutes Manual:   yes Patient Status During Pull:  stable Post Pull Site:  Level 0 Post Pull Instructions Given:  yes Post Pull Pulses Present: palpable rt dp Dressing Applied:  Gauze and tegaderm Bedrest begins @ 1050 Comments:

## 2018-03-25 LAB — COOXEMETRY PANEL
Carboxyhemoglobin: 1 % (ref 0.5–1.5)
Carboxyhemoglobin: 1.2 % (ref 0.5–1.5)
METHEMOGLOBIN: 1.4 % (ref 0.0–1.5)
METHEMOGLOBIN: 1.4 % (ref 0.0–1.5)
O2 Saturation: 42 %
O2 Saturation: 63.6 %
TOTAL HEMOGLOBIN: 11.2 g/dL — AB (ref 12.0–16.0)
Total hemoglobin: 9.9 g/dL — ABNORMAL LOW (ref 12.0–16.0)

## 2018-03-25 LAB — BASIC METABOLIC PANEL
Anion gap: 15 (ref 5–15)
BUN: 43 mg/dL — AB (ref 6–20)
CO2: 22 mmol/L (ref 22–32)
CREATININE: 1.99 mg/dL — AB (ref 0.44–1.00)
Calcium: 9.2 mg/dL (ref 8.9–10.3)
Chloride: 101 mmol/L (ref 101–111)
GFR, EST AFRICAN AMERICAN: 27 mL/min — AB (ref >60)
GFR, EST NON AFRICAN AMERICAN: 24 mL/min — AB (ref >60)
Glucose, Bld: 201 mg/dL — ABNORMAL HIGH (ref 65–99)
Potassium: 3.8 mmol/L (ref 3.5–5.1)
SODIUM: 138 mmol/L (ref 135–145)

## 2018-03-25 LAB — CBC
HEMATOCRIT: 32.1 % — AB (ref 36.0–46.0)
HEMOGLOBIN: 10.4 g/dL — AB (ref 12.0–15.0)
MCH: 30.6 pg (ref 26.0–34.0)
MCHC: 32.4 g/dL (ref 30.0–36.0)
MCV: 94.4 fL (ref 78.0–100.0)
Platelets: 160 10*3/uL (ref 150–400)
RBC: 3.4 MIL/uL — ABNORMAL LOW (ref 3.87–5.11)
RDW: 17 % — ABNORMAL HIGH (ref 11.5–15.5)
WBC: 6.8 10*3/uL (ref 4.0–10.5)

## 2018-03-25 LAB — GLUCOSE, CAPILLARY
GLUCOSE-CAPILLARY: 139 mg/dL — AB (ref 65–99)
GLUCOSE-CAPILLARY: 188 mg/dL — AB (ref 65–99)
Glucose-Capillary: 154 mg/dL — ABNORMAL HIGH (ref 65–99)
Glucose-Capillary: 237 mg/dL — ABNORMAL HIGH (ref 65–99)

## 2018-03-25 MED ORDER — HYDRALAZINE HCL 25 MG PO TABS
37.5000 mg | ORAL_TABLET | Freq: Three times a day (TID) | ORAL | Status: DC
  Administered 2018-03-25 – 2018-03-26 (×3): 37.5 mg via ORAL
  Filled 2018-03-25 (×4): qty 1.5

## 2018-03-25 MED ORDER — MILRINONE LACTATE IN DEXTROSE 20-5 MG/100ML-% IV SOLN
0.2500 ug/kg/min | INTRAVENOUS | Status: DC
  Administered 2018-03-25: 0.125 ug/kg/min via INTRAVENOUS
  Administered 2018-03-27 (×2): 0.25 ug/kg/min via INTRAVENOUS
  Administered 2018-03-28 – 2018-04-01 (×4): 0.125 ug/kg/min via INTRAVENOUS
  Administered 2018-04-02 – 2018-04-05 (×5): 0.25 ug/kg/min via INTRAVENOUS
  Filled 2018-03-25 (×13): qty 100

## 2018-03-25 NOTE — Progress Notes (Addendum)
Advanced Heart Failure Rounding Note  PCP-Cardiologist: Hailey NoeHenry W Smith III, MD   Subjective:     Yesterday diuresed with 80 mg IV lasix. Negative 1.1 liters. BB was stopped.   Denies SOB/CP. Mild orthopnea. Still bloated.   Objective:   Weight Range: 156 lb 1.4 oz (70.8 kg) Body mass index is 33.78 kg/m.   Vital Signs:   Temp:  [97.5 F (36.4 C)-98.6 F (37 C)] 97.9 F (36.6 C) (04/03 0805) Pulse Rate:  [0-127] 70 (04/03 0502) Resp:  [0-27] 18 (04/03 0805) BP: (115-184)/(61-114) 151/69 (04/03 0805) SpO2:  [0 %-100 %] 98 % (04/03 0502) Last BM Date: 03/21/18  Weight change: Filed Weights   03/21/18 0645  Weight: 156 lb 1.4 oz (70.8 kg)    Intake/Output:   Intake/Output Summary (Last 24 hours) at 03/25/2018 0845 Last data filed at 03/25/2018 0655 Gross per 24 hour  Intake 480 ml  Output 1150 ml  Net -670 ml      Physical Exam   CVP 14 personally checked.  General:  Elderly  No resp difficulty. In bed HEENT: Normal Neck: Supple. JVP to jaw. Carotids 2+ bilat; no bruits. No lymphadenopathy or thyromegaly appreciated. Cor: PMI nondisplaced. Regular rate & rhythm. +s3 Lungs: Clear Abdomen: Soft, nontender, nondistended. No hepatosplenomegaly. No bruits or masses. Good bowel sounds. Extremities: No cyanosis, clubbing, rash, R and LLE 2+   edema Neuro: Alert & orientedx3, cranial nerves grossly intact. moves all 4 extremities w/o difficulty. Affect pleasant   Telemetry   NSR 60-70s with PVCs   EKG    None   Labs    CBC Recent Labs    03/24/18 0205 03/25/18 0510  WBC 6.7 6.8  HGB 10.3* 10.4*  HCT 33.1* 32.1*  MCV 94.3 94.4  PLT 154 160   Basic Metabolic Panel Recent Labs    16/09/9603/01/19 1608 03/24/18 0205  NA 137 138  K 4.1 4.0  CL 100* 100*  CO2 21* 22  GLUCOSE 134* 175*  BUN 50* 51*  CREATININE 2.07* 2.09*  CALCIUM 9.3 8.8*   Liver Function Tests No results for input(s): AST, ALT, ALKPHOS, BILITOT, PROT, ALBUMIN in the last 72  hours. No results for input(s): LIPASE, AMYLASE in the last 72 hours. Cardiac Enzymes No results for input(s): CKTOTAL, CKMB, CKMBINDEX, TROPONINI in the last 72 hours.  BNP: BNP (last 3 results) No results for input(s): BNP in the last 8760 hours.  ProBNP (last 3 results) Recent Labs    11/07/17 1214 03/02/18 1724 03/20/18 1534  PROBNP 11,244* 2,115.0* 04,54032,943*     D-Dimer No results for input(s): DDIMER in the last 72 hours. Hemoglobin A1C No results for input(s): HGBA1C in the last 72 hours. Fasting Lipid Panel No results for input(s): CHOL, HDL, LDLCALC, TRIG, CHOLHDL, LDLDIRECT in the last 72 hours. Thyroid Function Tests No results for input(s): TSH, T4TOTAL, T3FREE, THYROIDAB in the last 72 hours.  Invalid input(s): FREET3  Other results:   Imaging    [place] Ekg Site Rite  Result Date: 03/24/2018 If Site Rite image not attached, placement could not be confirmed due to current cardiac rhythm.     Medications:     Scheduled Medications: . aspirin EC  81 mg Oral Daily  . atorvastatin  40 mg Oral Daily  . cycloSPORINE  1 drop Both Eyes BID  . famotidine  20 mg Oral Daily  . fluticasone  1 spray Each Nare Daily  . furosemide  80 mg Intravenous BID  .  heparin  5,000 Units Subcutaneous Q8H  . hydrALAZINE  37.5 mg Oral Q8H  . insulin aspart  0-15 Units Subcutaneous TID WC  . insulin aspart  0-5 Units Subcutaneous QHS  . isosorbide mononitrate  30 mg Oral Daily  . pantoprazole  40 mg Oral Daily  . probenecid  500 mg Oral BID  . sodium chloride flush  10-40 mL Intracatheter Q12H  . sodium chloride flush  3 mL Intravenous Q12H     Infusions: . sodium chloride 75 mL/hr at 03/24/18 0718  . sodium chloride       PRN Medications:  sodium chloride, acetaminophen, albuterol, diazepam, nitroGLYCERIN, ondansetron (ZOFRAN) IV, ondansetron (ZOFRAN) IV, sodium chloride flush, sodium chloride flush    Patient Profile  Hailey Levine is a 72 year old with a history  of CAD S/P CABG x 3 in 2002 with stent 2003, chronic systolic heart failure, htn, hyperlipidemia, DM, and CKD.   Admitted with chest pain.    Assessment/Plan  A/C Systolic Heart Failure  - EF 20-25% due to ICM  - RHC with markedly elevated biventricular filling pressures and low cardiac output - CVP 14. IInitial CO-OX low 47%. Continue IV lasix.  - Check CO-OX and BMET now.  - Hold bb for now  - Continue Entresto.  - No dig with CKD  -3. Hyperlipidemia  -Contine atorvastatin.  4. HTN - Elevated. Increase hydralazine.  5. CKD Stage IV - Creatinine baseline 1.7-1.9  -Follow up BMET closely.   Consult cardiac rehab.  Needs another day of diuresis.   Medication concerns reviewed with patient and pharmacy team. Barriers identified: None  CO-OX 42%. Will add 0.125 mcg milrinone. Repeat CO-OX in am.    Length of Stay: 5  Hailey Clegg, NP  03/25/2018, 8:45 AM  Advanced Heart Failure Team Pager [phone] (M-F; 7a - 4p)  Please contact [facility] for night-coverage after hours (4p -7a ) and weekends on [email or web address]  Patient seen and examined with Hailey Becket, NP. We discussed all aspects of the encounter. I agree with the assessment and plan as stated above.   Diuresis is sluggish. Still markedly volume overloaded. Co-ox low consistent with ow output. Agree with starting IV milrinone for inotropic support - would start at 0.25. Continue IV diuresis. Watch renal function closely.   Hailey Meres, MD  7:43 PM

## 2018-03-26 ENCOUNTER — Encounter (HOSPITAL_COMMUNITY)
Admission: EM | Disposition: A | Discharge status: Hospice | Payer: Self-pay | Source: Home / Self Care | Attending: Vascular Surgery

## 2018-03-26 ENCOUNTER — Inpatient Hospital Stay (HOSPITAL_COMMUNITY): Payer: Medicare HMO

## 2018-03-26 ENCOUNTER — Inpatient Hospital Stay (HOSPITAL_COMMUNITY): Payer: Medicare HMO | Admitting: Certified Registered"

## 2018-03-26 ENCOUNTER — Inpatient Hospital Stay (HOSPITAL_COMMUNITY)
Admission: EM | Disposition: A | Discharge status: Hospice | Payer: Self-pay | Source: Home / Self Care | Attending: Vascular Surgery

## 2018-03-26 DIAGNOSIS — I998 Other disorder of circulatory system: Secondary | ICD-10-CM | POA: Diagnosis present

## 2018-03-26 DIAGNOSIS — I739 Peripheral vascular disease, unspecified: Secondary | ICD-10-CM

## 2018-03-26 DIAGNOSIS — R609 Edema, unspecified: Secondary | ICD-10-CM

## 2018-03-26 DIAGNOSIS — M79609 Pain in unspecified limb: Secondary | ICD-10-CM

## 2018-03-26 HISTORY — PX: FEMORAL-FEMORAL BYPASS GRAFT: SHX936

## 2018-03-26 HISTORY — PX: ABDOMINAL AORTOGRAM: CATH118222

## 2018-03-26 HISTORY — PX: LOWER EXTREMITY ANGIOGRAPHY: CATH118251

## 2018-03-26 HISTORY — PX: PERIPHERAL VASCULAR INTERVENTION: CATH118257

## 2018-03-26 LAB — BASIC METABOLIC PANEL
ANION GAP: 14 (ref 5–15)
BUN: 46 mg/dL — ABNORMAL HIGH (ref 6–20)
CALCIUM: 8.9 mg/dL (ref 8.9–10.3)
CO2: 23 mmol/L (ref 22–32)
Chloride: 99 mmol/L — ABNORMAL LOW (ref 101–111)
Creatinine, Ser: 2.11 mg/dL — ABNORMAL HIGH (ref 0.44–1.00)
GFR calc Af Amer: 26 mL/min — ABNORMAL LOW (ref >60)
GFR, EST NON AFRICAN AMERICAN: 22 mL/min — AB (ref >60)
GLUCOSE: 177 mg/dL — AB (ref 65–99)
POTASSIUM: 3.9 mmol/L (ref 3.5–5.1)
Sodium: 136 mmol/L (ref 135–145)

## 2018-03-26 LAB — POCT ACTIVATED CLOTTING TIME
ACTIVATED CLOTTING TIME: 246 s
ACTIVATED CLOTTING TIME: 230 s
Activated Clotting Time: 213 seconds

## 2018-03-26 LAB — CBC
HCT: 30.1 % — ABNORMAL LOW (ref 36.0–46.0)
Hemoglobin: 9.8 g/dL — ABNORMAL LOW (ref 12.0–15.0)
MCH: 30.6 pg (ref 26.0–34.0)
MCHC: 32.6 g/dL (ref 30.0–36.0)
MCV: 94.1 fL (ref 78.0–100.0)
PLATELETS: 160 10*3/uL (ref 150–400)
RBC: 3.2 MIL/uL — ABNORMAL LOW (ref 3.87–5.11)
RDW: 17.2 % — AB (ref 11.5–15.5)
WBC: 6.9 10*3/uL (ref 4.0–10.5)

## 2018-03-26 LAB — GLUCOSE, CAPILLARY
GLUCOSE-CAPILLARY: 180 mg/dL — AB (ref 65–99)
GLUCOSE-CAPILLARY: 152 mg/dL — AB (ref 65–99)
GLUCOSE-CAPILLARY: 166 mg/dL — AB (ref 65–99)

## 2018-03-26 LAB — COOXEMETRY PANEL
CARBOXYHEMOGLOBIN: 1.1 % (ref 0.5–1.5)
METHEMOGLOBIN: 1.5 % (ref 0.0–1.5)
O2 Saturation: 49 %
Total hemoglobin: 9.9 g/dL — ABNORMAL LOW (ref 12.0–16.0)

## 2018-03-26 LAB — TYPE AND SCREEN
ABO/RH(D): O NEG
ANTIBODY SCREEN: NEGATIVE

## 2018-03-26 SURGERY — ABDOMINAL AORTOGRAM
Anesthesia: LOCAL

## 2018-03-26 SURGERY — CREATION, BYPASS, ARTERIAL, FEMORAL TO FEMORAL, USING GRAFT
Anesthesia: General | Site: Groin | Laterality: Left

## 2018-03-26 MED ORDER — HEPARIN (PORCINE) IN NACL 2-0.9 UNIT/ML-% IJ SOLN
INTRAMUSCULAR | Status: AC
  Filled 2018-03-26: qty 1000

## 2018-03-26 MED ORDER — HEMOSTATIC AGENTS (NO CHARGE) OPTIME
TOPICAL | Status: DC | PRN
  Administered 2018-03-26 (×2): 1 via TOPICAL

## 2018-03-26 MED ORDER — LIDOCAINE HCL (PF) 1 % IJ SOLN
INTRAMUSCULAR | Status: DC | PRN
  Administered 2018-03-26: 15 mL

## 2018-03-26 MED ORDER — CEFAZOLIN SODIUM-DEXTROSE 2-3 GM-%(50ML) IV SOLR
INTRAVENOUS | Status: DC | PRN
  Administered 2018-03-26: 2 g via INTRAVENOUS

## 2018-03-26 MED ORDER — ONDANSETRON HCL 4 MG/2ML IJ SOLN
INTRAMUSCULAR | Status: DC | PRN
  Administered 2018-03-26: 4 mg via INTRAVENOUS

## 2018-03-26 MED ORDER — PROTAMINE SULFATE 10 MG/ML IV SOLN
INTRAVENOUS | Status: DC | PRN
  Administered 2018-03-26: 30 mg via INTRAVENOUS
  Administered 2018-03-26: 20 mg via INTRAVENOUS

## 2018-03-26 MED ORDER — ROCURONIUM BROMIDE 10 MG/ML (PF) SYRINGE
PREFILLED_SYRINGE | INTRAVENOUS | Status: AC
  Filled 2018-03-26: qty 5

## 2018-03-26 MED ORDER — PHENYLEPHRINE HCL 10 MG/ML IJ SOLN
INTRAVENOUS | Status: DC | PRN
  Administered 2018-03-26: 30 ug/min via INTRAVENOUS

## 2018-03-26 MED ORDER — EPHEDRINE SULFATE-NACL 50-0.9 MG/10ML-% IV SOSY
PREFILLED_SYRINGE | INTRAVENOUS | Status: DC | PRN
  Administered 2018-03-26 (×2): 5 mg via INTRAVENOUS
  Administered 2018-03-26: 10 mg via INTRAVENOUS

## 2018-03-26 MED ORDER — VASOPRESSIN 20 UNIT/ML IV SOLN
INTRAVENOUS | Status: AC
  Filled 2018-03-26: qty 1

## 2018-03-26 MED ORDER — SODIUM CHLORIDE 0.9 % IV SOLN
INTRAVENOUS | Status: AC
  Filled 2018-03-26: qty 1.2

## 2018-03-26 MED ORDER — SODIUM CHLORIDE 0.9 % IV SOLN: INTRAVENOUS | Status: DC

## 2018-03-26 MED ORDER — PHENYLEPHRINE 40 MCG/ML (10ML) SYRINGE FOR IV PUSH (FOR BLOOD PRESSURE SUPPORT)
PREFILLED_SYRINGE | INTRAVENOUS | Status: DC | PRN
  Administered 2018-03-26: 120 ug via INTRAVENOUS
  Administered 2018-03-26: 80 ug via INTRAVENOUS

## 2018-03-26 MED ORDER — SUGAMMADEX SODIUM 200 MG/2ML IV SOLN
INTRAVENOUS | Status: DC | PRN
  Administered 2018-03-26: 200 mg via INTRAVENOUS

## 2018-03-26 MED ORDER — PROTAMINE SULFATE 10 MG/ML IV SOLN
INTRAVENOUS | Status: AC
  Filled 2018-03-26: qty 5

## 2018-03-26 MED ORDER — HEPARIN SODIUM (PORCINE) 1000 UNIT/ML IJ SOLN
INTRAMUSCULAR | Status: DC | PRN
  Administered 2018-03-26: 7000 [IU] via INTRAVENOUS

## 2018-03-26 MED ORDER — ONDANSETRON HCL 4 MG/2ML IJ SOLN
INTRAMUSCULAR | Status: AC
  Filled 2018-03-26: qty 2

## 2018-03-26 MED ORDER — LIDOCAINE HCL (CARDIAC) 20 MG/ML IV SOLN
INTRAVENOUS | Status: AC
  Filled 2018-03-26: qty 5

## 2018-03-26 MED ORDER — ROCURONIUM BROMIDE 10 MG/ML (PF) SYRINGE
PREFILLED_SYRINGE | INTRAVENOUS | Status: DC | PRN
  Administered 2018-03-26: 20 mg via INTRAVENOUS
  Administered 2018-03-26: 50 mg via INTRAVENOUS

## 2018-03-26 MED ORDER — SUGAMMADEX SODIUM 200 MG/2ML IV SOLN
INTRAVENOUS | Status: AC
  Filled 2018-03-26: qty 2

## 2018-03-26 MED ORDER — LIDOCAINE HCL 1 % IJ SOLN
INTRAMUSCULAR | Status: AC
  Filled 2018-03-26: qty 20

## 2018-03-26 MED ORDER — IODIXANOL 320 MG/ML IV SOLN
INTRAVENOUS | Status: DC | PRN
  Administered 2018-03-26: 25 mL via INTRAVENOUS

## 2018-03-26 MED ORDER — 0.9 % SODIUM CHLORIDE (POUR BTL) OPTIME
TOPICAL | Status: DC | PRN
  Administered 2018-03-26: 2000 mL

## 2018-03-26 MED ORDER — SUCCINYLCHOLINE CHLORIDE 20 MG/ML IJ SOLN
INTRAMUSCULAR | Status: DC | PRN
  Administered 2018-03-26: 120 mg via INTRAVENOUS

## 2018-03-26 MED ORDER — HEPARIN SODIUM (PORCINE) 1000 UNIT/ML IJ SOLN
INTRAMUSCULAR | Status: AC
  Filled 2018-03-26: qty 1

## 2018-03-26 MED ORDER — SODIUM CHLORIDE 0.9 % IV SOLN
INTRAVENOUS | Status: DC | PRN
  Administered 2018-03-26: 500 mL

## 2018-03-26 MED ORDER — ETOMIDATE 2 MG/ML IV SOLN
INTRAVENOUS | Status: DC | PRN
  Administered 2018-03-26: 12 mg via INTRAVENOUS

## 2018-03-26 MED ORDER — SODIUM CHLORIDE 0.9 % IV SOLN
INTRAVENOUS | Status: DC | PRN
  Administered 2018-03-26 (×3): via INTRAVENOUS

## 2018-03-26 MED ORDER — LIDOCAINE HCL (CARDIAC) 20 MG/ML IV SOLN
INTRAVENOUS | Status: DC | PRN
  Administered 2018-03-26: 50 mg via INTRATRACHEAL

## 2018-03-26 MED ORDER — FENTANYL CITRATE (PF) 250 MCG/5ML IJ SOLN
INTRAMUSCULAR | Status: DC | PRN
  Administered 2018-03-26: 50 ug via INTRAVENOUS

## 2018-03-26 MED ORDER — HYDRALAZINE HCL 50 MG PO TABS
50.0000 mg | ORAL_TABLET | Freq: Three times a day (TID) | ORAL | Status: DC
  Administered 2018-03-26: 50 mg via ORAL
  Filled 2018-03-26: qty 1

## 2018-03-26 MED ORDER — FENTANYL CITRATE (PF) 250 MCG/5ML IJ SOLN
INTRAMUSCULAR | Status: AC
  Filled 2018-03-26: qty 5

## 2018-03-26 MED FILL — Heparin Sodium (Porcine) 2 Unit/ML in Sodium Chloride 0.9%: INTRAMUSCULAR | Qty: 500 | Status: AC

## 2018-03-26 SURGICAL SUPPLY — 51 items
BAG ISL DRAPE 18X18 STRL (DRAPES) ×1
BAG ISOLATION DRAPE 18X18 (DRAPES) IMPLANT
CANISTER SUCT 3000ML PPV (MISCELLANEOUS) ×2 IMPLANT
CANNULA VESSEL 3MM 2 BLNT TIP (CANNULA) ×3 IMPLANT
CLIP VESOCCLUDE MED 24/CT (CLIP) ×2 IMPLANT
CLIP VESOCCLUDE SM WIDE 24/CT (CLIP) ×2 IMPLANT
DRAIN CHANNEL 15F RND FF W/TCR (WOUND CARE) IMPLANT
DRAPE ISOLATION BAG 18X18 (DRAPES) ×1
ELECT REM PT RETURN 9FT ADLT (ELECTROSURGICAL) ×2
ELECTRODE REM PT RTRN 9FT ADLT (ELECTROSURGICAL) ×1 IMPLANT
EVACUATOR SILICONE 100CC (DRAIN) IMPLANT
GAUZE SPONGE 4X4 16PLY XRAY LF (GAUZE/BANDAGES/DRESSINGS) ×1 IMPLANT
GLOVE BIO SURGEON STRL SZ7.5 (GLOVE) ×2 IMPLANT
GLOVE BIOGEL PI IND STRL 6.5 (GLOVE) IMPLANT
GLOVE BIOGEL PI IND STRL 7.0 (GLOVE) IMPLANT
GLOVE BIOGEL PI IND STRL 7.5 (GLOVE) IMPLANT
GLOVE BIOGEL PI IND STRL 8 (GLOVE) ×1 IMPLANT
GLOVE BIOGEL PI INDICATOR 6.5 (GLOVE) ×1
GLOVE BIOGEL PI INDICATOR 7.0 (GLOVE) ×1
GLOVE BIOGEL PI INDICATOR 7.5 (GLOVE) ×1
GLOVE BIOGEL PI INDICATOR 8 (GLOVE) ×1
GLOVE ECLIPSE 7.0 STRL STRAW (GLOVE) ×1 IMPLANT
GOWN STRL REUS W/ TWL LRG LVL3 (GOWN DISPOSABLE) ×3 IMPLANT
GOWN STRL REUS W/TWL LRG LVL3 (GOWN DISPOSABLE) ×6
GRAFT HEMASHIELD 8MM (Vascular Products) ×2 IMPLANT
GRAFT VASC STRG 30X8KNIT (Vascular Products) IMPLANT
HEMOSTAT SNOW SURGICEL 2X4 (HEMOSTASIS) ×2 IMPLANT
INSERT FOGARTY SM (MISCELLANEOUS) ×3 IMPLANT
KIT BASIN OR (CUSTOM PROCEDURE TRAY) ×2 IMPLANT
KIT DRSG PREVENA PLUS 7DAY 125 (MISCELLANEOUS) ×2 IMPLANT
KIT PREVENA INCISION MGT 13 (CANNISTER) ×2 IMPLANT
KIT TURNOVER KIT B (KITS) ×2 IMPLANT
LOOP VESSEL MINI RED (MISCELLANEOUS) ×1 IMPLANT
NS IRRIG 1000ML POUR BTL (IV SOLUTION) ×4 IMPLANT
PACK PERIPHERAL VASCULAR (CUSTOM PROCEDURE TRAY) ×2 IMPLANT
PAD ARMBOARD 7.5X6 YLW CONV (MISCELLANEOUS) ×4 IMPLANT
SPONGE SURGIFOAM ABS GEL 100 (HEMOSTASIS) IMPLANT
STAPLER VISISTAT (STAPLE) IMPLANT
SUT PROLENE 5 0 C 1 24 (SUTURE) ×6 IMPLANT
SUT PROLENE 5 0 C 1 36 (SUTURE) ×1 IMPLANT
SUT PROLENE 6 0 BV (SUTURE) ×2 IMPLANT
SUT SILK 2 0 PERMA HAND 18 BK (SUTURE) IMPLANT
SUT SILK 2 0 SH (SUTURE) ×2 IMPLANT
SUT VIC AB 2-0 CTB1 (SUTURE) ×4 IMPLANT
SUT VIC AB 3-0 SH 27 (SUTURE) ×4
SUT VIC AB 3-0 SH 27X BRD (SUTURE) ×2 IMPLANT
SUT VICRYL 4-0 PS2 18IN ABS (SUTURE) ×4 IMPLANT
TOWEL GREEN STERILE (TOWEL DISPOSABLE) ×2 IMPLANT
TRAY FOLEY MTR SLVR 16FR STAT (CATHETERS) ×2 IMPLANT
UNDERPAD 30X30 (UNDERPADS AND DIAPERS) ×2 IMPLANT
WATER STERILE IRR 1000ML POUR (IV SOLUTION) ×2 IMPLANT

## 2018-03-26 SURGICAL SUPPLY — 21 items
BALLN MUSTANG 4X60X75 (BALLOONS) ×4
BALLOON MUSTANG 4X60X75 (BALLOONS) IMPLANT
CATH ANGIO 5F PIGTAIL 65CM (CATHETERS) ×1 IMPLANT
COVER PRB 48X5XTLSCP FOLD TPE (BAG) IMPLANT
COVER PROBE 5X48 (BAG) ×4
DEVICE CONTINUOUS FLUSH (MISCELLANEOUS) ×1 IMPLANT
FILTER CO2 0.2 MICRON (VASCULAR PRODUCTS) ×1 IMPLANT
KIT ENCORE 26 ADVANTAGE (KITS) ×1 IMPLANT
KIT MICROPUNCTURE NIT STIFF (SHEATH) ×1 IMPLANT
KIT PV (KITS) ×4 IMPLANT
RESERVOIR CO2 (VASCULAR PRODUCTS) ×1 IMPLANT
SET FLUSH CO2 (MISCELLANEOUS) ×1 IMPLANT
SHEATH AVANTI 11CM 5FR (SHEATH) ×1 IMPLANT
SHEATH BRITE TIP 7FR 35CM (SHEATH) ×1 IMPLANT
STENT VIABAHNBX 6X59X135 (Permanent Stent) ×1 IMPLANT
SYR MEDRAD MARK V 150ML (SYRINGE) ×4 IMPLANT
TRANSDUCER W/STOPCOCK (MISCELLANEOUS) ×4 IMPLANT
TRAY PV CATH (CUSTOM PROCEDURE TRAY) ×4 IMPLANT
WIRE BENTSON .035X145CM (WIRE) ×1 IMPLANT
WIRE HI TORQ VERSACORE J 260CM (WIRE) ×1 IMPLANT
WIRE ROSEN-J .035X260CM (WIRE) IMPLANT

## 2018-03-26 NOTE — Anesthesia Procedure Notes (Addendum)
Procedure Name: Intubation Date/Time: 03/26/2018 3:31 PM Performed by: Myna Bright, CRNA Pre-anesthesia Checklist: Patient identified, Emergency Drugs available, Suction available and Patient being monitored Patient Re-evaluated:Patient Re-evaluated prior to induction Oxygen Delivery Method: Circle System Utilized Preoxygenation: Pre-oxygenation with 100% oxygen Induction Type: IV induction Ventilation: Mask ventilation without difficulty Laryngoscope Size: Mac and 3 Grade View: Grade I Tube type: Oral Tube size: 7.5 mm Number of attempts: 1 Airway Equipment and Method: Stylet and Oral airway Placement Confirmation: ETT inserted through vocal cords under direct vision,  positive ETCO2 and breath sounds checked- equal and bilateral Secured at: 21 cm Tube secured with: Tape Dental Injury: Teeth and Oropharynx as per pre-operative assessment

## 2018-03-26 NOTE — Progress Notes (Addendum)
Advanced Heart Failure Rounding Note  PCP-Cardiologist: Lesleigh NoeHenry W Smith III, MD   Subjective:    Yesterday milrinone was increased to 0.25 mcg. However co-ox still remains low at 49%  Overnight she developed numbness RLE with some pain in the R foot.   Denies SOB. CVP 14   Objective:   Weight Range: 159 lb 13.3 oz (72.5 kg) Body mass index is 34.59 kg/m.   Vital Signs:   Temp:  [97.8 F (36.6 C)-98.8 F (37.1 C)] 98.8 F (37.1 C) (04/04 0744) Pulse Rate:  [72-75] 75 (04/03 2322) Resp:  [14-23] 14 (04/03 2322) BP: (128-159)/(55-72) 132/71 (04/04 0744) SpO2:  [96 %-100 %] 96 % (04/03 2322) Weight:  [159 lb 13.3 oz (72.5 kg)] 159 lb 13.3 oz (72.5 kg) (04/04 0439) Last BM Date: 03/21/18  Weight change: Filed Weights   03/21/18 0645 03/26/18 0439  Weight: 156 lb 1.4 oz (70.8 kg) 159 lb 13.3 oz (72.5 kg)    Intake/Output:   Intake/Output Summary (Last 24 hours) at 03/26/2018 0939 Last data filed at 03/26/2018 0500 Gross per 24 hour  Intake 632.8 ml  Output 1250 ml  Net -617.2 ml      Physical Exam  CVP 15.  General:  No resp difficulty. Sitting up in bed.  HEENT: normal Neck: supple. JVD to jaw . Carotids 2+ bilat; no bruits. No lymphadenopathy or thryomegaly appreciated. Cor: PMI laterally displaced. Regular rate & rhythm. No rubs, gallops or murmurs. Lungs: clear Abdomen: soft, nontender, nondistended. No hepatosplenomegaly. No bruits or masses. Good bowel sounds. Extremities: no cyanosis, clubbing, rash, edema. R foot cool insensate unable to doppler pedal/post tib pulse. L foot warm. R and LLE 1+ edema.  Neuro: alert & orientedx3, cranial nerves grossly intact. moves all 4 extremities w/o difficulty. Affect pleasant  Telemetry   NSR 60-70s with PVCs   EKG    None   Labs    CBC Recent Labs    03/25/18 0510 03/26/18 0506  WBC 6.8 6.9  HGB 10.4* 9.8*  HCT 32.1* 30.1*  MCV 94.4 94.1  PLT 160 160   Basic Metabolic Panel Recent Labs   03/25/18 0910 03/26/18 0506  NA 138 136  K 3.8 3.9  CL 101 99*  CO2 22 23  GLUCOSE 201* 177*  BUN 43* 46*  CREATININE 1.99* 2.11*  CALCIUM 9.2 8.9   Liver Function Tests No results for input(s): AST, ALT, ALKPHOS, BILITOT, PROT, ALBUMIN in the last 72 hours. No results for input(s): LIPASE, AMYLASE in the last 72 hours. Cardiac Enzymes No results for input(s): CKTOTAL, CKMB, CKMBINDEX, TROPONINI in the last 72 hours.  BNP: BNP (last 3 results) No results for input(s): BNP in the last 8760 hours.  ProBNP (last 3 results) Recent Labs    11/07/17 1214 03/02/18 1724 03/20/18 1534  PROBNP 11,244* 2,115.0* 62,95232,943*     D-Dimer No results for input(s): DDIMER in the last 72 hours. Hemoglobin A1C No results for input(s): HGBA1C in the last 72 hours. Fasting Lipid Panel No results for input(s): CHOL, HDL, LDLCALC, TRIG, CHOLHDL, LDLDIRECT in the last 72 hours. Thyroid Function Tests No results for input(s): TSH, T4TOTAL, T3FREE, THYROIDAB in the last 72 hours.  Invalid input(s): FREET3  Other results:   Imaging    No results found.   Medications:     Scheduled Medications: . aspirin EC  81 mg Oral Daily  . atorvastatin  40 mg Oral Daily  . cycloSPORINE  1 drop Both Eyes BID  .  famotidine  20 mg Oral Daily  . fluticasone  1 spray Each Nare Daily  . furosemide  80 mg Intravenous BID  . heparin  5,000 Units Subcutaneous Q8H  . hydrALAZINE  37.5 mg Oral Q8H  . insulin aspart  0-15 Units Subcutaneous TID WC  . insulin aspart  0-5 Units Subcutaneous QHS  . isosorbide mononitrate  30 mg Oral Daily  . pantoprazole  40 mg Oral Daily  . probenecid  500 mg Oral BID  . sodium chloride flush  10-40 mL Intracatheter Q12H  . sodium chloride flush  3 mL Intravenous Q12H    Infusions: . sodium chloride    . milrinone 0.25 mcg/kg/min (03/25/18 2007)    PRN Medications: sodium chloride, acetaminophen, albuterol, diazepam, nitroGLYCERIN, ondansetron (ZOFRAN) IV,  ondansetron (ZOFRAN) IV, sodium chloride flush, sodium chloride flush    Patient Profile  Hailey Levine is a 72 year old with a history of CAD S/P CABG x 3 in 2002 with stent 2003, chronic systolic heart failure, htn, hyperlipidemia, DM, and CKD.   Admitted with chest pain.    Assessment/Plan  A/C Systolic Heart Failure  - EF 20-25% due to ICM  - RHC with markedly elevated biventricular filling pressures and low cardiac output - Todays CO-OX 49% on milrinone 0.25 mcg. Repeat CO-OX  - Volume status elevated. CVP 15. Continue IV lasix.  - Hold bb for now  - Continue Entresto.  - No dig with CKD  -3. Hyperlipidemia  -Contine atorvastatin.  4. HTN -Elevated.  -Increase hydralazine to 50 mg tid.   5. CKD Stage IV - Creatinine baseline 1.7-1.9  - Creatinine 2.1  6. RLE cool- concern for ischemia.  Last night developed numbness RLE. RLE cool this morning.  Unable to doppler R pedal/post tib pulses.  Check US DVT and ABIs now.   Medication concerns reviewed with patient and pharmacy team. Barriers identified: None   Length of Stay: 6  Amy Clegg, NP  03/26/2018, 9:39 AM  Advanced Heart Failure Team Pager [phone] (M-F; 7a - 4p)  Please contact [facility] for night-coverage after hours (4p -7a ) and weekends on [email or web address]  Patient seen and examined with Tonye Becket, NP. We discussed all aspects of the encounter. I agree with the assessment and plan as stated above.   Remains with low output HF with low co-ox despite milrinone. Continues with volume overload. Says urine output is up but weight unchanged. Will continue IV intoropes and IV lasix.   This am now with ischemic RLE. U/s viewed personally and shows poor arterial flow. I have d/w VVS who will see him today.   Arvilla Meres, MD  11:47 AM

## 2018-03-26 NOTE — Consult Note (Addendum)
VASCULAR SURGERY ASSESSMENT & PLAN:   ISCHEMIC RIGHT LOWER EXTREMITY: I was called to see this patient today because of an ischemic right lower extremity. History is outlined below. On my history prior to thisdmission the ent describes some bilateral lower extremity claudication that involved her hips thighs and calves. 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 She had cardiac catheterization on Tuesday and then the following day Wednesday, she developed acute onset of pain and numbness in the right lower extremity. This persisted today.  On physical exam I cannot palpate a right femoral pulse. She does have a left femoral pulse. She has brisk Doppler signals in the left foot with no Doppler flow in the right foot.  I reviewed her noninvasive study today. This showed severely dampened monophasic flow in the right external and common iliac arteries on the right with a monophasic left femoral signal suggesting proximal disease there also. There was no Doppler flow in the right foot.  Her creatinine is 2.1 with a GFR of 22. Thus I do not think she is a good candidate for a CT angiogram. I have recommended CO2 arteriography with limited contrast to try to define the etiology of her acute right lower extremity ischemia. She did undergo catheterization from the right femoral approach and its possible that the issue is limited to the common femoral artery. However I think more likely the patient had underlying aortoiliac occlusive disease that may have gone on to thrombose on the right. Given her recent MI and low ejection fraction she is honestly high-risk for surgery need to try to keep things as simple as possible. Certainly if there is an endovascular option for addressing her ischemia that would be ideal. If not perhaps she'll be a candidate for a left-to-right femorofemoral bypass pending the results of her arteriogram.  I have discussed the indications for the procedure and  the potential complications including, but not limited to, bleeding, wound healing problems,kidney failure, and limb loss. All her questions were answered and she is agreeable to proceed.  Waverly Ferrari, MD, FACS Beeper [phone] Office: [phone]     Requested by:  Dr. Gala Romney  Reason for consultation: ischemic right lower extremity    History of Present Illness   AALIJAH MIMS is a 72 y.o. (1946-05-18) female who presented to the ED with chest pain with history of CAD s/p carotid endarterectomy and CABGx3 2002 with subsequent PCI 2003.  She was diagnosed with an NSTEMI.  Cardiac catheterization via right common femoral artery demonstrated occluded bypass x2 and multivessel disease with recommendations for agressive heart failure management and lifevest with EF 20-25%.  Last night she developed sudden numbness in R foot and leg and pain with movement.  On exam her main concern is numbness below her knee and she feels like her leg is "dead" when standing on it.  She denies any excessive pain however foot is tender to touch.  Arterial duplex could not detect any pedal circulation and suggested aortoiliac occlusive disease with monophasic R EIA and L EIA.  She is NPO.  PMH also significant for DM type 2 and CKD with elevated creatinine to 2.11.   Past Medical History:  Diagnosis Date  . Arthritis   . CKD (chronic kidney disease)   . Coronary artery disease   . Depression   . Diabetes mellitus without complication (HCC)   . Hyperlipidemia   . Hypertension   . Myocardial infarction (HCC) 2002   MI x 2 -  one prior to 2002    Past Surgical History:  Procedure Laterality Date  . ABDOMINAL HYSTERECTOMY    . CAROTID ENDARTERECTOMY Left 05/21/2001  . CHOLECYSTECTOMY    . CORONARY ANGIOPLASTY WITH STENT PLACEMENT  2003  . CORONARY ARTERY BYPASS GRAFT  2002  . EYE SURGERY Bilateral    cataract  . OVARIAN CYST REMOVAL    . REVERSE SHOULDER ARTHROPLASTY Left 04/05/2014    Procedure: LEFT REVERSE SHOULDER ARTHROPLASTY;  Surgeon: Senaida LangeKevin M Supple, MD;  Location: [facility] OR;  Service: Orthopedics;  Laterality: Left;  . RIGHT/LEFT HEART CATH AND CORONARY/GRAFT ANGIOGRAPHY N/A 03/24/2018   Procedure: RIGHT/LEFT HEART CATH AND CORONARY/GRAFT ANGIOGRAPHY;  Surgeon: Lennette BihariKelly, Thomas A, MD;  Location: [facility] INVASIVE CV LAB;  Service: Cardiovascular;  Laterality: N/A;  . TUBAL LIGATION       Social History   Socioeconomic History  . Marital status: Widowed    Spouse name: Not on file  . Number of children: Not on file  . Years of education: Not on file  . Highest education level: Not on file  Occupational History  . Occupation: retired  Engineer, productionocial Needs  . Financial resource strain: Not on file  . Food insecurity:    Worry: Not on file    Inability: Not on file  . Transportation needs:    Medical: Not on file    Non-medical: Not on file  Tobacco Use  . Smoking status: Former Smoker    Packs/day: 0.75    Years: 36.00    Pack years: 27.00    Types: Cigarettes    Last attempt to quit: 12/16/1998    Years since quitting: 19.2  . Smokeless tobacco: Never Used  Substance and Sexual Activity  . Alcohol use: Yes    Alcohol/week: 0.0 oz    Comment: strawberry dacquiri once a year  . Drug use: No  . Sexual activity: Not on file  Lifestyle  . Physical activity:    Days per week: Not on file    Minutes per session: Not on file  . Stress: Not on file  Relationships  . Social connections:    Talks on phone: Not on file    Gets together: Not on file    Attends religious service: Not on file    Active member of club or organization: Not on file    Attends meetings of clubs or organizations: Not on file    Relationship status: Not on file  . Intimate partner violence:    Fear of current or ex partner: Not on file    Emotionally abused: Not on file    Physically abused: Not on file    Forced sexual activity: Not on file  Other Topics Concern  . Not on file  Social History  Narrative  . Not on file    Family History  Problem Relation Age of Onset  . Heart attack Father [id number]    Current Facility-Administered Medications  Medication Dose Route Frequency Provider Last Rate Last Dose  . 0.9 %  sodium chloride infusion  250 mL Intravenous PRN Lennette BihariKelly, Thomas A, MD      . 0.9 %  sodium chloride infusion   Intravenous Continuous Chuck Hintickson, Christopher S, MD      . acetaminophen (TYLENOL) tablet 650 mg  650 mg Oral Q4H PRN Lennette BihariKelly, Thomas A, MD   650 mg at 03/26/18 0413  . albuterol (PROVENTIL) (2.5 MG/3ML) 0.083% nebulizer solution 2.5 mg  2.5 mg Nebulization Q6H PRN Antoine PocheBranch, Jonathan F,  MD      . aspirin EC tablet 81 mg  81 mg Oral Daily Ernest Mallick, MD   81 mg at 03/26/18 1144  . atorvastatin (LIPITOR) tablet 40 mg  40 mg Oral Daily Ernest Mallick, MD   40 mg at 03/26/18 1146  . cycloSPORINE (RESTASIS) 0.05 % ophthalmic emulsion 1 drop  1 drop Both Eyes BID Ernest Mallick, MD   1 drop at 03/25/18 2136  . diazepam (VALIUM) tablet 5 mg  5 mg Oral Q6H PRN Lennette Bihari, MD   5 mg at 03/26/18 1145  . famotidine (PEPCID) tablet 20 mg  20 mg Oral Daily Ernest Mallick, MD   20 mg at 03/26/18 1145  . fluticasone (FLONASE) 50 MCG/ACT nasal spray 1 spray  1 spray Each Nare Daily Ernest Mallick, MD   1 spray at 03/25/18 2136  . furosemide (LASIX) injection 80 mg  80 mg Intravenous BID Clegg, Amy D, NP   80 mg at 03/25/18 1748  . heparin injection 5,000 Units  5,000 Units Subcutaneous Q8H Lennette Bihari, MD   5,000 Units at 03/26/18 4098  . hydrALAZINE (APRESOLINE) tablet 50 mg  50 mg Oral Q8H Bensimhon, Bevelyn Buckles, MD      . insulin aspart (novoLOG) injection 0-15 Units  0-15 Units Subcutaneous TID WC Ernest Mallick, MD   3 Units at 03/25/18 1748  . insulin aspart (novoLOG) injection 0-5 Units  0-5 Units Subcutaneous QHS Ernest Mallick, MD   2 Units at 03/25/18 2147  . isosorbide mononitrate (IMDUR) 24 hr tablet 30 mg  30 mg Oral Daily Ernest Mallick, MD   30 mg at  03/26/18 1144  . milrinone (PRIMACOR) 20 MG/100 ML (0.2 mg/mL) infusion  0.25 mcg/kg/min Intravenous Continuous Bensimhon, Bevelyn Buckles, MD 5.3 mL/hr at 03/25/18 2007 0.25 mcg/kg/min at 03/25/18 2007  . nitroGLYCERIN (NITROSTAT) SL tablet 0.4 mg  0.4 mg Sublingual Q5 min PRN Ernest Mallick, MD      . ondansetron [facility]) injection 4 mg  4 mg Intravenous Q6H PRN Ernest Mallick, MD      . ondansetron [facility]) injection 4 mg  4 mg Intravenous Q6H PRN Lennette Bihari, MD      . pantoprazole (PROTONIX) EC tablet 40 mg  40 mg Oral Daily Ernest Mallick, MD   40 mg at 03/26/18 1145  . probenecid (BENEMID) tablet 500 mg  500 mg Oral BID Kathleene Hazel, MD   500 mg at 03/25/18 2135  . sodium chloride flush (NS) 0.9 % injection 10-40 mL  10-40 mL Intracatheter Q12H Lennette Bihari, MD   10 mL at 03/26/18 1149  . sodium chloride flush (NS) 0.9 % injection 10-40 mL  10-40 mL Intracatheter PRN Lennette Bihari, MD      . sodium chloride flush (NS) 0.9 % injection 3 mL  3 mL Intravenous Q12H Lennette Bihari, MD   3 mL at 03/26/18 1149  . sodium chloride flush (NS) 0.9 % injection 3 mL  3 mL Intravenous PRN Lennette Bihari, MD        Allergies  Allergen Reactions  . Codeine Shortness Of Breath  . Coreg [Carvedilol] Shortness Of Breath and Cough  . Metoprolol Nausea And Vomiting    "Throws up white, foamy liquid"  . Percocet [Oxycodone-Acetaminophen] Nausea And Vomiting  . Hydrocodone Cough    This was in a cough syrup that made her cough WORSE  . Benazepril Cough    REVIEW OF SYSTEMS (negative unless checked):  Cardiac:  []  Chest pain or chest pressure? []  Shortness of breath upon activity? []  Shortness of breath when lying flat? []  Irregular heart rhythm?  Vascular:  [x]  Pain in calf, thigh, or hip brought on by walking? []  Pain in feet at night that wakes you up from your sleep? []  Blood clot in your veins? [x]  Leg swelling?  Pulmonary:  []  Oxygen at home? []  Productive  cough? []  Wheezing?  Neurologic:  []  Sudden weakness in arms or legs? [x]  Sudden numbness in arms or legs? []  Sudden onset of difficult speaking or slurred speech? []  Temporary loss of vision in one eye? []  Problems with dizziness?  Gastrointestinal:  []  Blood in stool? []  Vomited blood?  Genitourinary:  []  Burning when urinating? []  Blood in urine?  Psychiatric:  []  Major depression  Hematologic:  []  Bleeding problems? []  Problems with blood clotting?  Dermatologic:  []  Rashes or ulcers?  Constitutional:  []  Fever or chills?  Ear/Nose/Throat:  []  Change in hearing? []  Nose bleeds? []  Sore throat?  Musculoskeletal:  []  Back pain? []  Joint pain? []  Muscle pain?   For VQI Use Only   PRE-ADM LIVING Home  AMB STATUS Ambulatory with Assistance  CAD Sx History of MI, but no symptoms MI < 6 months ago  PRIOR CHF Moderate  STRESS TEST No    Physical Examination     Vitals:   03/25/18 2322 03/26/18 0439 03/26/18 0744 03/26/18 1133  BP: (!) 151/64  132/71 (!) 155/65  Pulse: 75   84  Resp: 14   (!) 28  Temp: 98.3 F (36.8 C)  98.8 F (37.1 C) 98.4 F (36.9 C)  TempSrc: Oral  Oral Oral  SpO2: 96%   93%  Weight:  159 lb 13.3 oz (72.5 kg)    Height:       Body mass index is 34.59 kg/m.  General Alert, O x 3, Obese, NAD  Head [place]/AT,          Neck Supple, mid-line trachea,    Pulmonary Sym exp, good B air movt, diminished in lower lung fields  Cardiac RRR, Nl S1, S2,  Vascular Vessel Right Left  Radial Palpable Palpable  Brachial Palpable Palpable  Carotid Palpable, No Bruit Palpable, No Bruit  Aorta Not palpable N/A  Femoral no definitive palpable pulse no definitive palpable pulse  Popliteal Not palpable Not palpable  PT Not palpable Not palpable  DP Not palpable ATA by doppler    Gastro- intestinal soft, non-distended, non-tender to palpation,   Musculo- skeletal M/S 5/5 throughout  , R foot cold to touch compared to left; coolness to  touch to the level of the mid lower leg  , No edema present, No visible varicosities , No Lipodermatosclerosis present     Psychiatric Judgement intact, Mood & affect appropriate for pt's clinical situation  Dermatologic See M/S exam for extremity exam, No rashes otherwise noted  Lymphatic  Palpable lymph nodes: None    Non-invasive Vascular Imaging     *PRELIMINARY RESULTS* Vascular Ultrasound Right Lower Extremity Arterial Duplex has been completed.   Severely dampened monophasic flow at the right external and common iliac arteries, along with strong monophasic flow in the left common femoral artery suggest significant obstruction of the proximal common iliac artery with probable obstruction of the aorta. Flow is significantly dampened/absent throughout the right lower extremity arteries with no discernable flow in the pedal arteries.   Preliminary results discussed with Tonye Becket, NP. Per Amy, ABI is not  needed at this time due to absence of pedal artery flow.    Medical Decision Making   AFSHEEN ANTONY is a 72 y.o. female who presents with ischemic RLE cold to touch   Arterial duplex demonstrating monophasic R and L EIA suggesting aortoiliac occlusive disease  No detectable flow in R lower leg and foot by duplex  No suggestion of traumatic catheterization R CFA  Plan is for CO2 aortogram with BLE runoff given CKD with rising Cr  Given recent MI and poor EF she is not a surgical candidate for aortoiliac reconstruction  Patient is aware of the threatened nature of her RLE and agrees to proceed first with CO2 angiography  Risks of the procedure were discussed with patient and son in law and they voiced their understanding  NPO; patient's nurse will obtain consent   Emilie Rutter, PA-C [facility] Office: [phone]  03/26/2018, 11:51 AM

## 2018-03-26 NOTE — Anesthesia Preprocedure Evaluation (Signed)
Anesthesia Evaluation  Patient identified by MRN, date of birth, ID band Patient awake    Reviewed: Allergy & Precautions, NPO status , Patient's Chart, lab work & pertinent test resultsPreop documentation limited or incomplete due to emergent nature of procedure.  Airway Mallampati: I  TM Distance: <3 FB Neck ROM: Full    Dental  (+) Upper Dentures, Lower Dentures   Pulmonary asthma , former smoker,     + decreased breath sounds      Cardiovascular hypertension, + CAD, + Past MI and +CHF   Rhythm:Regular Rate:Normal     Neuro/Psych PSYCHIATRIC DISORDERS Depression    GI/Hepatic Neg liver ROS, GERD  Medicated,  Endo/Other  diabetes, Type 2, Oral Hypoglycemic Agents  Renal/GU Renal disease     Musculoskeletal   Abdominal (+) + obese,   Peds  Hematology   Anesthesia Other Findings   Reproductive/Obstetrics                             Anesthesia Physical Anesthesia Plan  ASA: IV and emergent  Anesthesia Plan: General   Post-op Pain Management:    Induction: Intravenous  PONV Risk Score and Plan: 4 or greater and Ondansetron  Airway Management Planned: Oral ETT  Additional Equipment: Arterial line  Intra-op Plan:   Post-operative Plan: Possible Post-op intubation/ventilation  Informed Consent: I have reviewed the patients History and Physical, chart, labs and discussed the procedure including the risks, benefits and alternatives for the proposed anesthesia with the patient or authorized representative who has indicated his/her understanding and acceptance.   Dental advisory given and Only emergency history available  Plan Discussed with: CRNA  Anesthesia Plan Comments:         Anesthesia Quick Evaluation

## 2018-03-26 NOTE — Progress Notes (Addendum)
*  PRELIMINARY RESULTS* Vascular Ultrasound Right Lower Extremity Arterial Duplex has been completed.   Severely dampened monophasic flow at the right external and common iliac arteries, along with strong monophasic flow in the left common femoral artery suggest significant obstruction of the proximal common iliac artery with probable obstruction of the aorta. Flow is significantly dampened/absent throughout the right lower extremity arteries with no discernable flow in the pedal arteries.   Preliminary results discussed with Tonye BecketAmy Clegg, NP. Per Amy, ABI is not needed at this time due to absence of pedal artery flow.  03/26/2018 10:31 AM Gertie FeyMichelle Alban Marucci, BS, RVT, RDCS, RDMS

## 2018-03-26 NOTE — Progress Notes (Signed)
Tonye BecketAmy Clegg, NP at bedside; orders stat arterial duplex and ABI of RLE.  Will await exam and results.  Patient stable otherwise

## 2018-03-26 NOTE — Progress Notes (Signed)
Patient c/o 5/10 pain to RLE at rest; 10/10 pain with movement.  RLE cool to touch as compared to LLE; cap refill in RLE greater than 3 seconds.  Unable to doppler DP/PT in RLE; confirmed x two RNs.  Physician paged.  Patient informed to remain in bed and use call light for any needs.  Await response from physician.

## 2018-03-26 NOTE — Progress Notes (Signed)
Emilie RutterMatthew Eveland, PA for Vascular surgery at bedside to update patient and POA re: plan for procedure/potential surgery.  Patient and POA w/stated verbal understanding.  Await orders to obtain consent.  Patient due Furosemide 80mg  IVP and on Milrinone gtt - CoOx 49 this AM.  Paged HF NP to determine appropriateness of foley catheter considering patient needs to continue to diurese but also going to be unable to use BSC as baseline and will be sedated for procedure/surgery.  Await response.

## 2018-03-26 NOTE — Transfer of Care (Signed)
Immediate Anesthesia Transfer of Care Note  Patient: Hailey Levine  Procedure(s) Performed: BYPASS GRAFT FEMORAL-FEMORAL ARTERY LEFT TO RIGHT (Left Groin)  Patient Location: PACU  Anesthesia Type:General  Level of Consciousness: sedated and responds to stimulation  Airway & Oxygen Therapy: Patient Spontanous Breathing and Patient connected to nasal cannula oxygen  Post-op Assessment: Report given to RN, Post -op Vital signs reviewed and stable and Patient moving all extremities  Post vital signs: Reviewed and stable  Last Vitals:  Vitals Value Taken Time  BP 112/74 03/26/2018  6:52 PM  Temp    Pulse 90 03/26/2018  6:59 PM  Resp 15 03/26/2018  7:00 PM  SpO2 95 % 03/26/2018  7:00 PM  Vitals shown include unvalidated device data.  Last Pain:  Vitals:   03/26/18 1859  TempSrc:   PainSc: (P) 0-No pain      Patients Stated Pain Goal: 0 (03/26/18 0348)  Complications: No apparent anesthesia complications

## 2018-03-26 NOTE — Progress Notes (Signed)
Confirmed w/vascular surgery to hold Lasix 80mg  IVP for 0800 dose.  Patient previously unavailable for dose d/t vascular ultrasounds; now preparing to transport to cath lab for vascular procedure and potentially surgery.  Will monitor.  Remains on Milrinone gtt.

## 2018-03-26 NOTE — Anesthesia Postprocedure Evaluation (Signed)
Anesthesia Post Note  Patient: Rhea BeltonMarilyn P Montufar  Procedure(s) Performed: BYPASS GRAFT FEMORAL-FEMORAL ARTERY LEFT TO RIGHT (Left Groin)     Patient location during evaluation: PACU Anesthesia Type: General Level of consciousness: awake and alert Pain management: pain level controlled Vital Signs Assessment: post-procedure vital signs reviewed and stable Respiratory status: spontaneous breathing, nonlabored ventilation, respiratory function stable and patient connected to nasal cannula oxygen Cardiovascular status: blood pressure returned to baseline and stable Postop Assessment: no apparent nausea or vomiting Anesthetic complications: no    Last Vitals:  Vitals:   03/26/18 2115 03/26/18 2130  BP:    Pulse: 71 76  Resp: 15 17  Temp:    SpO2: 100% 100%    Last Pain:  Vitals:   03/26/18 1945  TempSrc:   PainSc: 0-No pain                 Amadea Keagy COKER

## 2018-03-26 NOTE — Progress Notes (Signed)
Preliminary notes by tech--Bilateral lower extremities venous duplex exam completed. Negative for DVT bilaterally. Limited study due to patient has a lot of pain on her right CFV region where the procedure site is.   Hongying Wateen Varon(RDMS RVT) 03/26/18 11:05 AM

## 2018-03-26 NOTE — Op Note (Signed)
PATIENT: Hailey Levine   MRN: [id number] DOB: 1946/02/27    DATE OF PROCEDURE: 03/26/2018  INDICATIONS:    JERINE SURLES is a 72 y.o. female Who developed an acutely ischemic right lower extremity with no Doppler flow in the right foot. She had no femoral pulse and evidence of iliac disease on the left side also. She's taken to the peripheral vascular lab for arteriography with CO2 and limited contrast.  PROCEDURE:    1. Ultrasound-guided access to the left common femoral artery 2. Aortogram and bilateral iliac arteriogram with CO2  And limited contrast 3. Angioplasty of the left common iliac artery with a 4 mm x 6 cm balloon 4. Angioplasty and stenting of left common iliac artery with a VBX 6 mm x 59 mm covered stent  SURGEON: Judeth Cornfield. Scot Dock, MD, FACS  ANESTHESIA: local   EBL: minimal  TECHNIQUE: the patient was taken to the peripheral vascular lab and was not sedated. Both groins were prepped and draped in usual sterile fashion. Under ultrasound guidance, after the skin was anesthetized, the left common femoral artery was cannulated with micropuncture needle Sung Amabile sheath introduced and the wire. This was exchanged for a 5 [place] sheath over a [employer] wire. I was able to get the [facility] wire through the iliac occlusion on the left and a pigtail catheter was advanced with some difficulty through the tight stenosis in the left common iliac artery up into the aorta. It was good return here. CO2 aortogram was obtained with the catheter at the level of the renal arteriesarea catheter was in position above the aortic bifurcation and a repeat CO2 and iliac arterial was obtained. The right common iliac and external iliac arteries were occluded. There also appeared to be disease in the common femoral artery and superficial femoral artery although there was poor visualization because of the proximal occlusion. The patient had a long segment 90% stenosis in the left common iliac  artery and elected to address this with a covered stent.  I exchanged the 5 French sheath for a 7 French sheath and the patient was then heparinized. ACT was monitored. I was unable to advance the covered stent through the stenosis because it was very tight. I tempted to advance the sheath but this was not successful either. Therefore I elected to  Pre-dilate the left common iliac artery with a 4 mm x 6 cm balloon. At that point we retracted the balloon down to the sheath after was deflated and then inflated it slightly and advanced through the common iliac artery to get the sheath above the common iliac artery. At this point the 6 mm x 59 mm covered stent was positioned at the marked level and the sheath was retracted. The covered stent wasn't applied without difficulty inflated to 15 atm for 1 minute. Follow up films showed neck result with no residual stenosis.  FINDINGS:   1. Moderate diffuse disease of the infrarenal aorta. 2. On the right side the common iliac artery is occluded  And likely dissected. There is some reconstitution of the distal right external iliac artery. There is disease throughout the common femoral artery and the superficial femoral artery appears to be occluded although there is limited visualization because of the proximal occlusion. Given the disease in the right common femoral artery I felt that this would need surgical exploration and therefore trying to dress the occlusion on the rightwith an endovascular approach I thought was contraindicated. 3. On the left side the  patient had a 90% long segment stenosisof the left common iliac artery which was successfully addressed with a covered stent as described above.  CLINICAL NOTE: the patient is being taken urgently to the operating for a left-to-right femorofemoral bypass graft. I have discussed the procedure due to complications with her and she is agreeable to proceed. We will proceed urgently.  Deitra Mayo, MD,  FACS [facility]  DATE OF DICTATION:   03/26/2018

## 2018-03-26 NOTE — Anesthesia Procedure Notes (Signed)
Arterial Line Insertion Start/End4/03/2018 3:53 PM, 03/26/2018 3:55 PM Performed by: Shelton SilvasHollis, Tirso Laws D, MD, anesthesiologist  Patient location: Pre-op. Preanesthetic checklist: patient identified, IV checked, site marked, risks and benefits discussed, surgical consent, monitors and equipment checked, pre-op evaluation, timeout performed and anesthesia consent Lidocaine 1% used for infiltration radial was placed Catheter size: 20 Fr Hand hygiene performed  and maximum sterile barriers used   Attempts: 1 Procedure performed without using ultrasound guided technique. Following insertion, dressing applied. Post procedure assessment: normal and unchanged  Patient tolerated the procedure well with no immediate complications.

## 2018-03-26 NOTE — Op Note (Signed)
NAME: Hailey Levine    MRN: [id number] DOB: 12/26/1945    DATE OF OPERATION: 03/26/2018  PREOP DIAGNOSIS:    Ischemic right lower extremity  POSTOP DIAGNOSIS:    Same  PROCEDURE:    Left femoral to right deep femoral artery bypass with 8 mm Dacron graft  SURGEON: Di Kindlehristopher S. Edilia Boickson, MD, FACS  ASSIST: Lianne CureMaureen Collins PA  ANESTHESIA: general   EBL: minimal  INDICATIONS:    Hailey JonesMarilyn P Bowie is a 72 y.o. female who developed an ischemic right lower extremity with no Doppler flow in the right lower extremity. 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 She underwent cardiac cath via a right femoral approach on Monday. She underwent angiogram today with CO2 a limited contrast initially showed an occluded iliac system on the right and significant disease in the right common femoral artery and superficial femoral artery. There was a 90% stenosis on the left which was addressed with a covered stent allowing the option of a left-to-right femorofemoral bypass. The patient presents for femorofemoral bypass grafting urgently.  FINDINGS:    at the completion was a posterior tibial and anterior tibial signal bilaterally.  TECHNIQUE:    The patient was taken to the operating room and received a general anesthetic. The lower abdomen both groins and both lower extremities were prepped and draped in usual sterile fashion. Longitudinal incision was made in the right groin. Dissection was carried down to the common femoral artery which was severely calcified and diseased. A dissector well under the inguinal ligament to get a soft spot in the artery where I could clamp.  Control the deep femoral artery which was soft and the superficial femoral artery also had severe diffuse disease proximally. I felt the best target on the right was the deep femoral artery.  On the left side than the sheath had been removed and pressure held for  hemostasis. I dissected down onto the common femoral artery and the arteriotomy was identified from the sheath and this was closed with a 5-0 Prolene. I controlled the common femoral artery proximally in the superficial femoral deep femoral arteries distally. Patella was grade between the 2 incisions and an 8 mm Dacron graft tunnel between the 2 incisions. Patient was heparinized.  Attention was first turned to the left femoral anastomosis. The common femoral artery, deep femoral and superficial femoral arteries were controlled. Longitudinal arteriotomy was made in the common femoral artery extending down onto the deep femoral artery. The graft was spatulated and sewn end-to-side to the artery using continuous 5-0 Prolene suture.  Simple appropriate length for anastomosis to the deep femoral artery on the right. The external iliac artery was clamped proximally multiple profunda branches were controlled with vessel loops and the superficial femoral artery was controlled. Longitudinal arteriotomy was made in the deep femoral artery. The right limb of the graft was cut to appropriate length, spatulated and sewn end-to-side to the deep femoral artery using continuous 5-0 Prolene suture. Prior to completing this anastomosis the arteries were backbled and flushed and was excellent inflow. There was backbleeding from the superficial femoral and deep femoral artery. Closure established to the right leg.   At this point there was a good Doppler Anterior tibial and PT posterior tibial signal bilaterally. Was partially reversed with protamine. The groin wounds were closed with 2 deep layers of 2-0 Vicryl, subcutaneous layer with 3-0 Vicryl and skin closed with 50. Predinner dressings were placed bilaterally.  The patient tolerated the procedure well and transferred to the recovery room in stable condition. All needle and sponge correct.  Waverly Ferrari, MD, FACS [facility]  DATE  OF DICTATION:   03/26/2018

## 2018-03-27 ENCOUNTER — Encounter (HOSPITAL_COMMUNITY): Payer: Self-pay | Admitting: Vascular Surgery

## 2018-03-27 DIAGNOSIS — N179 Acute kidney failure, unspecified: Secondary | ICD-10-CM

## 2018-03-27 DIAGNOSIS — I998 Other disorder of circulatory system: Secondary | ICD-10-CM

## 2018-03-27 LAB — BASIC METABOLIC PANEL
ANION GAP: 13 (ref 5–15)
Anion gap: 8 (ref 5–15)
Anion gap: 13 (ref 5–15)
BUN: 42 mg/dL — AB (ref 6–20)
BUN: 29 mg/dL — ABNORMAL HIGH (ref 6–20)
BUN: 46 mg/dL — ABNORMAL HIGH (ref 6–20)
CALCIUM: 8.4 mg/dL — AB (ref 8.9–10.3)
CALCIUM: 8.2 mg/dL — AB (ref 8.9–10.3)
CHLORIDE: 103 mmol/L (ref 101–111)
CO2: 23 mmol/L (ref 22–32)
CO2: 13 mmol/L — AB (ref 22–32)
CO2: 22 mmol/L (ref 22–32)
CREATININE: 1.4 mg/dL — AB (ref 0.44–1.00)
CREATININE: 2.51 mg/dL — AB (ref 0.44–1.00)
Calcium: 4.8 mg/dL — CL (ref 8.9–10.3)
Chloride: 103 mmol/L (ref 101–111)
Chloride: 121 mmol/L — ABNORMAL HIGH (ref 101–111)
Creatinine, Ser: 2.14 mg/dL — ABNORMAL HIGH (ref 0.44–1.00)
GFR calc Af Amer: 25 mL/min — ABNORMAL LOW (ref >60)
GFR calc Af Amer: 42 mL/min — ABNORMAL LOW (ref >60)
GFR calc non Af Amer: 36 mL/min — ABNORMAL LOW (ref >60)
GFR calc non Af Amer: 18 mL/min — ABNORMAL LOW (ref >60)
GFR, EST AFRICAN AMERICAN: 21 mL/min — AB (ref >60)
GFR, EST NON AFRICAN AMERICAN: 22 mL/min — AB (ref >60)
GLUCOSE: 172 mg/dL — AB (ref 65–99)
GLUCOSE: 139 mg/dL — AB (ref 65–99)
GLUCOSE: 236 mg/dL — AB (ref 65–99)
Potassium: 3.8 mmol/L (ref 3.5–5.1)
Potassium: 2.1 mmol/L — CL (ref 3.5–5.1)
Potassium: 3.8 mmol/L (ref 3.5–5.1)
SODIUM: 142 mmol/L (ref 135–145)
Sodium: 139 mmol/L (ref 135–145)
Sodium: 138 mmol/L (ref 135–145)

## 2018-03-27 LAB — GLUCOSE, CAPILLARY
GLUCOSE-CAPILLARY: 171 mg/dL — AB (ref 65–99)
Glucose-Capillary: 231 mg/dL — ABNORMAL HIGH (ref 65–99)
Glucose-Capillary: 223 mg/dL — ABNORMAL HIGH (ref 65–99)
Glucose-Capillary: 181 mg/dL — ABNORMAL HIGH (ref 65–99)

## 2018-03-27 LAB — COOXEMETRY PANEL
CARBOXYHEMOGLOBIN: 1.7 % — AB (ref 0.5–1.5)
Methemoglobin: 0.9 % (ref 0.0–1.5)
O2 Saturation: 69.6 %
Total hemoglobin: 10.3 g/dL — ABNORMAL LOW (ref 12.0–16.0)

## 2018-03-27 LAB — CBC
HCT: 26.5 % — ABNORMAL LOW (ref 36.0–46.0)
Hemoglobin: 8.5 g/dL — ABNORMAL LOW (ref 12.0–15.0)
MCH: 30.6 pg (ref 26.0–34.0)
MCHC: 32.1 g/dL (ref 30.0–36.0)
MCV: 95.3 fL (ref 78.0–100.0)
PLATELETS: 145 10*3/uL — AB (ref 150–400)
RBC: 2.78 MIL/uL — ABNORMAL LOW (ref 3.87–5.11)
RDW: 17.2 % — AB (ref 11.5–15.5)
WBC: 9.6 10*3/uL (ref 4.0–10.5)

## 2018-03-27 LAB — POCT I-STAT, CHEM 8
BUN: 41 mg/dL — AB (ref 6–20)
CALCIUM ION: 1.11 mmol/L — AB (ref 1.15–1.40)
CREATININE: 2.4 mg/dL — AB (ref 0.44–1.00)
Chloride: 103 mmol/L (ref 101–111)
GLUCOSE: 234 mg/dL — AB (ref 65–99)
HEMATOCRIT: 24 % — AB (ref 36.0–46.0)
HEMOGLOBIN: 8.2 g/dL — AB (ref 12.0–15.0)
Potassium: 3.8 mmol/L (ref 3.5–5.1)
Sodium: 138 mmol/L (ref 135–145)
TCO2: 24 mmol/L (ref 22–32)

## 2018-03-27 MED ORDER — FUROSEMIDE 10 MG/ML IJ SOLN
160.0000 mg | Freq: Two times a day (BID) | INTRAVENOUS | Status: DC
  Filled 2018-03-27: qty 16

## 2018-03-27 MED ORDER — FUROSEMIDE 10 MG/ML IJ SOLN: 160.0000 mg | Freq: Once | INTRAVENOUS | Status: DC

## 2018-03-27 MED ORDER — FUROSEMIDE 10 MG/ML IJ SOLN
160.0000 mg | Freq: Two times a day (BID) | INTRAVENOUS | Status: DC
  Administered 2018-03-27 – 2018-03-28 (×2): 160 mg via INTRAVENOUS
  Filled 2018-03-27 (×2): qty 16
  Filled 2018-03-27: qty 2

## 2018-03-27 MED ORDER — FUROSEMIDE 10 MG/ML IJ SOLN
80.0000 mg | Freq: Once | INTRAMUSCULAR | Status: DC
Start: 2018-03-27 — End: 2018-03-27

## 2018-03-27 MED ORDER — NOREPINEPHRINE BITARTRATE 1 MG/ML IV SOLN
0.0000 ug/min | INTRAVENOUS | Status: DC
  Administered 2018-03-27: 4 ug/min via INTRAVENOUS
  Administered 2018-03-27: 8 ug/min via INTRAVENOUS
  Administered 2018-03-28: 16 ug/min via INTRAVENOUS
  Administered 2018-03-28 (×2): 14 ug/min via INTRAVENOUS
  Filled 2018-03-27 (×6): qty 4

## 2018-03-27 MED FILL — Heparin Sodium (Porcine) 2 Unit/ML in Sodium Chloride 0.9%: INTRAMUSCULAR | Qty: 1000 | Status: AC

## 2018-03-27 MED FILL — Lidocaine HCl Local Inj 1%: INTRAMUSCULAR | Qty: 20 | Status: AC

## 2018-03-27 NOTE — Progress Notes (Signed)
   VASCULAR SURGERY ASSESSMENT & PLAN:   1 Day Post-Op s/p:  Angioplasty and stenting of left common iliac stenosis with VBX stent Left to right femorofemoral bypass  Good Doppler signals in both feet.  Okay to mobilize from my standpoint.  She received a total of 25 cc of contrast yesterday.  Her creatinine is unchanged at 2.1 today.  Her urine output has been low but given her history of heart failure I will defer management of her fluids to cardiology.  She is a set up for wound healing problems in her groins given her diabetes and obesity.  She has the Prevena dressings in place which should stay for 7 days.   SUBJECTIVE:   No specific complaints.  PHYSICAL EXAM:   Vitals:   03/27/18 0345 03/27/18 0400 03/27/18 0500 03/27/18 0600  BP:  (!) 100/35 (!) 104/36 (!) 108/42  Pulse:  95 95 (!) 106  Resp:  18 17 18   Temp: 98.6 F (37 C)     TempSrc: Oral     SpO2:  94% 100% 100%  Weight:   160 lb 0.9 oz (72.6 kg)   Height:       Good dorsalis pedis and posterior tibial signals bilaterally. Prevena's have a good seal.  LABS:   Lab Results  Component Value Date   WBC 9.6 03/27/2018   HGB 8.5 (L) 03/27/2018   HCT 26.5 (L) 03/27/2018   MCV 95.3 03/27/2018   PLT 145 (L) 03/27/2018   Lab Results  Component Value Date   CREATININE 2.14 (H) 03/27/2018   Lab Results  Component Value Date   INR 1.17 03/23/2018   CBG (last 3)  Recent Labs    03/26/18 1240 03/26/18 1854 03/26/18 2213  GLUCAP 180* 152* 166*    PROBLEM LIST:    Principal Problem:   NSTEMI (non-ST elevated myocardial infarction) (HCC) Active Problems:   Coronary artery disease involving coronary bypass graft of native heart with angina pectoris (HCC)   Essential hypertension   Hyperlipidemia   Diabetes mellitus type 2 in obese (HCC)   CKD stage 3 due to type 2 diabetes mellitus (HCC)   Acute on chronic combined systolic and diastolic CHF (congestive heart failure) (HCC)   Ischemic  leg   CURRENT MEDS:   . aspirin EC  81 mg Oral Daily  . atorvastatin  40 mg Oral Daily  . cycloSPORINE  1 drop Both Eyes BID  . famotidine  20 mg Oral Daily  . fluticasone  1 spray Each Nare Daily  . furosemide  80 mg Intravenous BID  . heparin  5,000 Units Subcutaneous Q8H  . hydrALAZINE  50 mg Oral Q8H  . insulin aspart  0-15 Units Subcutaneous TID WC  . insulin aspart  0-5 Units Subcutaneous QHS  . isosorbide mononitrate  30 mg Oral Daily  . pantoprazole  40 mg Oral Daily  . probenecid  500 mg Oral BID  . sodium chloride flush  10-40 mL Intracatheter Q12H    Waverly FerrariChristopher Dickson Beeper: [phone] Office: [phone] 03/27/2018

## 2018-03-27 NOTE — Progress Notes (Addendum)
Advanced Heart Failure Rounding Note  PCP-Cardiologist: Lesleigh NoeHenry W Smith III, MD   Subjective:   03/26/2018 S/P Angioplasty and stenting of left common iliac stenosis with VBX stent.Left to right femorofemoral bypass.   Yesterday developed critical ischemia of LLE. Taken for stenting of LLE followed by L-> R fem-fem bypass  Creatinine 2.1   Feels weak today. No CP. Mild SOB. Essential anuric. Co-ox improved on milrinone 69%    Objective:   Weight Range: 160 lb 0.9 oz (72.6 kg) Body mass index is 34.64 kg/m.   Vital Signs:   Temp:  [97 F (36.1 C)-98.7 F (37.1 C)] 98.7 F (37.1 C) (04/05 0802) Pulse Rate:  [0-106] 90 (04/05 1000) Resp:  [0-77] 19 (04/05 1000) BP: (92-187)/(34-83) 92/63 (04/05 1000) SpO2:  [0 %-100 %] 100 % (04/05 1000) Arterial Line BP: (79-205)/(20-103) 99/35 (04/05 1000) Weight:  [160 lb 0.9 oz (72.6 kg)] 160 lb 0.9 oz (72.6 kg) (04/05 0500) Last BM Date: 03/21/18  Weight change: Filed Weights   03/21/18 0645 03/26/18 0439 03/27/18 0500  Weight: 156 lb 1.4 oz (70.8 kg) 159 lb 13.3 oz (72.5 kg) 160 lb 0.9 oz (72.6 kg)    Intake/Output:   Intake/Output Summary (Last 24 hours) at 03/27/2018 1059 Last data filed at 03/27/2018 1000 Gross per 24 hour  Intake 1396 ml  Output 1065 ml  Net 331 ml      Physical Exam  CVP 15.  General:   Lying flat in bed No resp difficulty HEENT: normal Neck: supple. JVP jaw. Carotids 2+ bilat; no bruits. No lymphadenopathy or thryomegaly appreciated. Cor: PMI nondisplaced. Regular rate & rhythm. No rubs, gallops or murmurs. Lungs: clear Abdomen: soft, nontender, nondistended. No hepatosplenomegaly. No bruits or masses. Good bowel sounds. Extremities: R and L groin with vac dressings in place, no cyanosis, clubbing, rash, LLE trace edema RLE 1+ edema PICC  Neuro: alert & orientedx3, cranial nerves grossly intact. moves all 4 extremities w/o difficulty. Affect pleasant   Telemetry   NSR 60-70s Personally  reviewed   EKG    None   Labs    CBC Recent Labs    03/26/18 0506 03/27/18 0400  WBC 6.9 9.6  HGB 9.8* 8.5*  HCT 30.1* 26.5*  MCV 94.1 95.3  PLT 160 145*   Basic Metabolic Panel Recent Labs    16/09/9603/04/19 0506 03/27/18 0400  NA 136 139  K 3.9 3.8  CL 99* 103  CO2 23 23  GLUCOSE 177* 172*  BUN 46* 42*  CREATININE 2.11* 2.14*  CALCIUM 8.9 8.4*   Liver Function Tests No results for input(s): AST, ALT, ALKPHOS, BILITOT, PROT, ALBUMIN in the last 72 hours. No results for input(s): LIPASE, AMYLASE in the last 72 hours. Cardiac Enzymes No results for input(s): CKTOTAL, CKMB, CKMBINDEX, TROPONINI in the last 72 hours.  BNP: BNP (last 3 results) No results for input(s): BNP in the last 8760 hours.  ProBNP (last 3 results) Recent Labs    11/07/17 1214 03/02/18 1724 03/20/18 1534  PROBNP 11,244* 2,115.0* 04,54032,943*     D-Dimer No results for input(s): DDIMER in the last 72 hours. Hemoglobin A1C No results for input(s): HGBA1C in the last 72 hours. Fasting Lipid Panel No results for input(s): CHOL, HDL, LDLCALC, TRIG, CHOLHDL, LDLDIRECT in the last 72 hours. Thyroid Function Tests No results for input(s): TSH, T4TOTAL, T3FREE, THYROIDAB in the last 72 hours.  Invalid input(s): FREET3  Other results:   Imaging    No results found.  Medications:     Scheduled Medications: . aspirin EC  81 mg Oral Daily  . atorvastatin  40 mg Oral Daily  . cycloSPORINE  1 drop Both Eyes BID  . famotidine  20 mg Oral Daily  . fluticasone  1 spray Each Nare Daily  . furosemide  80 mg Intravenous BID  . heparin  5,000 Units Subcutaneous Q8H  . insulin aspart  0-15 Units Subcutaneous TID WC  . insulin aspart  0-5 Units Subcutaneous QHS  . pantoprazole  40 mg Oral Daily  . probenecid  500 mg Oral BID  . sodium chloride flush  10-40 mL Intracatheter Q12H    Infusions: . sodium chloride    . sodium chloride    . milrinone 0.25 mcg/kg/min (03/27/18 0700)    PRN  Medications: sodium chloride, acetaminophen, albuterol, diazepam, nitroGLYCERIN, ondansetron (ZOFRAN) IV, ondansetron (ZOFRAN) IV, sodium chloride flush    Patient Profile  Ms Prim is a 72 year old with a history of CAD S/P CABG x 3 in 2002 with stent 2003, chronic systolic heart failure, htn, hyperlipidemia, DM, and CKD.   Admitted with chest pain.    Assessment/Plan  A/C Systolic Heart Failure  - EF 20-25% due to ICM  - RHC with markedly elevated biventricular filling pressures and low cardiac output - Todays CO-OX 70% on milrinone 0.25 mcg.  - CVP 15. Continue IV lasix.  -- Hold bb for now  - Off entresto. Stop hydralazine/imdur with hypotension.   - No dig with CKD  -3. Hyperlipidemia  -Contine atorvastatin.  4. HTN -Hypotensive today. Stop hydralazine/imdur for now.  5. CKD Stage IV - Creatinine baseline 1.7-1.9  - Creatinine 2.1  6. Ischemic RLE Vascular consult appreciated.  S/p 03/26/2018  Angioplasty and stenting of left common iliac stenosis with VBX stent. Left to right femorofemoral bypass  Consult PT.   Medication concerns reviewed with patient and pharmacy team. Barriers identified: None   Length of Stay: 7  Amy Clegg, NP  03/27/2018, 10:59 AM  Advanced Heart Failure Team Pager [phone] (M-F; 7a - 4p)  Please contact [facility] for night-coverage after hours (4p -7a ) and weekends on [email or web address]  .Agree with above.   Remains very tenuous after BLE revsacularization. Volume status increasing. Remains anuric despite inotropic support  On exam weak appearing JVP to jaw Cor RRR +s3 Lungs coarse Ab soft NT Ext +surgical wounds. Good perfusion  She is critically ill with cardiogenic shock, critical limb ischemia and now AKI. Suspect AKI due ATN. Will continue hemodynamic support. Give IV lasix 160. No evidence of respiratory failure or uremia currently but I discussed need for possible CVVHD over the weekend with patient and her family.    CRITICAL CARE Performed by: Arvilla Meres  Total critical care time: 35 minutes  Critical care time was exclusive of separately billable procedures and treating other patients.  Critical care was necessary to treat or prevent imminent or life-threatening deterioration.  Critical care was time spent personally by me (independent of midlevel providers or residents) on the following activities: development of treatment plan with patient and/or surrogate as well as nursing, discussions with consultants, evaluation of patient's response to treatment, examination of patient, obtaining history from patient or surrogate, ordering and performing treatments and interventions, ordering and review of laboratory studies, ordering and review of radiographic studies, pulse oximetry and re-evaluation of patient's condition.  Arvilla Meres, MD  9:03 PM

## 2018-03-27 NOTE — Care Management Note (Signed)
Case Management Note Donn PieriniKristi Annelyse Rey RN, BSN Unit 4E-Case Manager-- 2H coverage [phone]  Patient Details  Name: Eber JonesMarilyn P Harper MRN: [id number] Date of Birth: 04/02/1946  Subjective/Objective:  Pt admitted with NSTEMI, acute HF, ischemic right lower ext. S/p Angioplasty and stenting of left common iliac stenosis with VBX stent Left to right femorofemoral bypass   Action/Plan: PTA pt lived at home PT to eval - CM will follow for transition of care needs.   Expected Discharge Date:  03/25/18               Expected Discharge Plan:     In-House Referral:     Discharge planning Services  CM Consult  Post Acute Care Choice:    Choice offered to:     DME Arranged:    DME Agency:     HH Arranged:    HH Agency:     Status of Service:  In process, will continue to follow  If discussed at Long Length of Stay Meetings, dates discussed:    Discharge Disposition:   Additional Comments:  Darrold SpanWebster, Kahlie Deutscher Hall, RN 03/27/2018, 12:28 PM

## 2018-03-28 ENCOUNTER — Inpatient Hospital Stay (HOSPITAL_COMMUNITY): Payer: Medicare HMO

## 2018-03-28 DIAGNOSIS — I480 Paroxysmal atrial fibrillation: Secondary | ICD-10-CM

## 2018-03-28 LAB — BASIC METABOLIC PANEL
Anion gap: 14 (ref 5–15)
BUN: 47 mg/dL — AB (ref 6–20)
CALCIUM: 8.2 mg/dL — AB (ref 8.9–10.3)
CO2: 20 mmol/L — ABNORMAL LOW (ref 22–32)
CREATININE: 2.63 mg/dL — AB (ref 0.44–1.00)
Chloride: 100 mmol/L — ABNORMAL LOW (ref 101–111)
GFR calc Af Amer: 20 mL/min — ABNORMAL LOW (ref >60)
GFR, EST NON AFRICAN AMERICAN: 17 mL/min — AB (ref >60)
GLUCOSE: 263 mg/dL — AB (ref 65–99)
POTASSIUM: 4.2 mmol/L (ref 3.5–5.1)
Sodium: 134 mmol/L — ABNORMAL LOW (ref 135–145)

## 2018-03-28 LAB — COOXEMETRY PANEL
Carboxyhemoglobin: 1.2 % (ref 0.5–1.5)
METHEMOGLOBIN: 1.5 % (ref 0.0–1.5)
O2 Saturation: 51.4 %
Total hemoglobin: 11 g/dL — ABNORMAL LOW (ref 12.0–16.0)

## 2018-03-28 LAB — CBC
HCT: 24.7 % — ABNORMAL LOW (ref 36.0–46.0)
Hemoglobin: 7.9 g/dL — ABNORMAL LOW (ref 12.0–15.0)
MCH: 30.4 pg (ref 26.0–34.0)
MCHC: 32 g/dL (ref 30.0–36.0)
MCV: 95 fL (ref 78.0–100.0)
PLATELETS: 165 10*3/uL (ref 150–400)
RBC: 2.6 MIL/uL — ABNORMAL LOW (ref 3.87–5.11)
RDW: 17.5 % — AB (ref 11.5–15.5)
WBC: 11.4 10*3/uL — ABNORMAL HIGH (ref 4.0–10.5)

## 2018-03-28 LAB — HEPARIN LEVEL (UNFRACTIONATED): Heparin Unfractionated: 0.37 IU/mL (ref 0.30–0.70)

## 2018-03-28 LAB — GLUCOSE, CAPILLARY
GLUCOSE-CAPILLARY: 263 mg/dL — AB (ref 65–99)
GLUCOSE-CAPILLARY: 160 mg/dL — AB (ref 65–99)
GLUCOSE-CAPILLARY: 137 mg/dL — AB (ref 65–99)
Glucose-Capillary: 249 mg/dL — ABNORMAL HIGH (ref 65–99)

## 2018-03-28 LAB — URINALYSIS, ROUTINE W REFLEX MICROSCOPIC
Bilirubin Urine: NEGATIVE
GLUCOSE, UA: NEGATIVE mg/dL
Ketones, ur: NEGATIVE mg/dL
Nitrite: NEGATIVE
PROTEIN: 30 mg/dL — AB
Specific Gravity, Urine: 1.023 (ref 1.005–1.030)
Trans Epithel, UA: 2
pH: 5 (ref 5.0–8.0)

## 2018-03-28 LAB — CREATININE, URINE, RANDOM: Creatinine, Urine: 156.92 mg/dL

## 2018-03-28 LAB — TSH: TSH: 1.163 u[IU]/mL (ref 0.350–4.500)

## 2018-03-28 LAB — SODIUM, URINE, RANDOM

## 2018-03-28 MED ORDER — AMIODARONE HCL IN DEXTROSE 360-4.14 MG/200ML-% IV SOLN
30.0000 mg/h | INTRAVENOUS | Status: DC
  Administered 2018-03-28 – 2018-03-30 (×6): 30 mg/h via INTRAVENOUS
  Filled 2018-03-28 (×6): qty 200

## 2018-03-28 MED ORDER — AMIODARONE HCL IN DEXTROSE 360-4.14 MG/200ML-% IV SOLN
60.0000 mg/h | INTRAVENOUS | Status: AC
Start: 2018-03-28 — End: 2018-03-28
  Administered 2018-03-28 (×2): 60 mg/h via INTRAVENOUS
  Filled 2018-03-28: qty 200

## 2018-03-28 MED ORDER — AMIODARONE HCL IN DEXTROSE 360-4.14 MG/200ML-% IV SOLN
INTRAVENOUS | Status: AC
  Filled 2018-03-28: qty 200

## 2018-03-28 MED ORDER — HEPARIN (PORCINE) IN NACL 100-0.45 UNIT/ML-% IJ SOLN
900.0000 [IU]/h | INTRAMUSCULAR | Status: DC
  Administered 2018-03-28: 750 [IU]/h via INTRAVENOUS
  Administered 2018-03-30: 650 [IU]/h via INTRAVENOUS
  Administered 2018-03-31 – 2018-04-05 (×7): 900 [IU]/h via INTRAVENOUS
  Filled 2018-03-28 (×8): qty 250

## 2018-03-28 MED ORDER — HEPARIN BOLUS VIA INFUSION
2000.0000 [IU] | Freq: Once | INTRAVENOUS | Status: DC
  Filled 2018-03-28: qty 2000

## 2018-03-28 MED ORDER — FUROSEMIDE 10 MG/ML IJ SOLN
160.0000 mg | Freq: Three times a day (TID) | INTRAVENOUS | Status: DC
  Administered 2018-03-28 – 2018-03-29 (×3): 160 mg via INTRAVENOUS
  Filled 2018-03-28: qty 16
  Filled 2018-03-28: qty 10
  Filled 2018-03-28 (×3): qty 16

## 2018-03-28 MED ORDER — AMIODARONE HCL IN DEXTROSE 360-4.14 MG/200ML-% IV SOLN: 60.0000 mg/h | INTRAVENOUS | Status: DC

## 2018-03-28 MED ORDER — AMIODARONE LOAD VIA INFUSION
150.0000 mg | Freq: Once | INTRAVENOUS | Status: AC
  Administered 2018-03-28: 150 mg via INTRAVENOUS

## 2018-03-28 MED ORDER — NOREPINEPHRINE BITARTRATE 1 MG/ML IV SOLN
0.0000 ug/min | INTRAVENOUS | Status: DC
  Administered 2018-03-28 – 2018-03-29 (×2): 16 ug/min via INTRAVENOUS
  Administered 2018-03-30: 20 ug/min via INTRAVENOUS
  Administered 2018-03-30: 22 ug/min via INTRAVENOUS
  Administered 2018-03-31: 13 ug/min via INTRAVENOUS
  Administered 2018-04-01: 10 ug/min via INTRAVENOUS
  Administered 2018-04-03: 6 ug/min via INTRAVENOUS
  Filled 2018-03-28 (×8): qty 16

## 2018-03-28 MED ORDER — AMIODARONE HCL IN DEXTROSE 360-4.14 MG/200ML-% IV SOLN: 30.0000 mg/h | INTRAVENOUS | Status: DC

## 2018-03-28 NOTE — Progress Notes (Signed)
    Subjective  - POD #2, s/p left CIA stent and left to right femoral femoral bypass  Decreased UOP and AFIB overnight   Physical Exam:  Prevena wound vacs in place and functioning well in each groin Doppler DP and PT signals bilaterally       Assessment/Plan:  POD #2  --Will defer management of renal and cardiac issues to heart failure team --stable from vascular perspective --wound vacs sty in place for 7 days  Wells Brabham 03/28/2018 11:15 AM --  Vitals:   03/28/18 0700 03/28/18 0757  BP: (!) 120/56   Pulse: 87   Resp: 17   Temp:  98.3 F (36.8 C)  SpO2: 100%     Intake/Output Summary (Last 24 hours) at 03/28/2018 1115 Last data filed at 03/28/2018 0834 Gross per 24 hour  Intake 1314.71 ml  Output 225 ml  Net 1089.71 ml     Laboratory CBC    Component Value Date/Time   WBC 11.4 (H) 03/28/2018 0404   HGB 7.9 (L) 03/28/2018 0404   HCT 24.7 (L) 03/28/2018 0404   PLT 165 03/28/2018 0404    BMET    Component Value Date/Time   NA 134 (L) 03/28/2018 0404   NA 137 03/20/2018 1534   K 4.2 03/28/2018 0404   CL 100 (L) 03/28/2018 0404   CO2 20 (L) 03/28/2018 0404   GLUCOSE 263 (H) 03/28/2018 0404   BUN 47 (H) 03/28/2018 0404   BUN 44 (H) 03/20/2018 1534   CREATININE 2.63 (H) 03/28/2018 0404   CREATININE 1.65 (H) 05/17/2016 1356   CALCIUM 8.2 (L) 03/28/2018 0404   GFRNONAA 17 (L) 03/28/2018 0404   GFRAA 20 (L) 03/28/2018 0404    COAG Lab Results  Component Value Date   INR 1.17 03/23/2018   INR 1.09 04/05/2014   No results found for: PTT  Antibiotics Anti-infectives (From admission, onward)   None       V. Charlena CrossWells Brabham IV, M.D. [facility] [place] Office: [phone] Pager:  [phone]

## 2018-03-28 NOTE — Progress Notes (Signed)
Advanced Heart Failure Rounding Note  PCP-Cardiologist: Lesleigh NoeHenry W Smith III, MD   Subjective:    03/26/2018 S/P Angioplasty and stenting of left common iliac stenosis with VBX stent.Left to right femorofemoral bypass.   Had AF overnight. IV amio started.  Sitting up in chair. Feels ok. Denies CP or SOB. CVP 13. Co-ox 51%  On milrinone 0.25 NE 14   Objective:   Weight Range: 76.8 kg (169 lb 5 oz) Body mass index is 36.64 kg/m.   Vital Signs:   Temp:  [98.1 F (36.7 C)-99 F (37.2 C)] 98.1 F (36.7 C) (04/06 1228) Pulse Rate:  [81-113] 83 (04/06 1300) Resp:  [14-31] 18 (04/06 1300) BP: (66-142)/(39-121) 119/48 (04/06 1300) SpO2:  [93 %-100 %] 98 % (04/06 1300) Arterial Line BP: (62-177)/(33-103) 67/51 (04/06 1300) Weight:  [76.8 kg (169 lb 5 oz)] 76.8 kg (169 lb 5 oz) (04/06 0458) Last BM Date: 03/21/18  Weight change: Filed Weights   03/26/18 0439 03/27/18 0500 03/28/18 0458  Weight: 72.5 kg (159 lb 13.3 oz) 72.6 kg (160 lb 0.9 oz) 76.8 kg (169 lb 5 oz)    Intake/Output:   Intake/Output Summary (Last 24 hours) at 03/28/2018 1432 Last data filed at 03/28/2018 1400 Gross per 24 hour  Intake 1867.17 ml  Output 235 ml  Net 1632.17 ml      Physical Exam   CVP 13-14 General:  Sitting up in chair NAD HEENT: normal Neck: supple. JVP jaw Carotids 2+ bilat; no bruits. No lymphadenopathy or thryomegaly appreciated. Cor: PMI nondisplaced. Regular mildly tachy No rubs, gallops or murmurs. Lungs: clear Abdomen: soft, nontender, nondistended. No hepatosplenomegaly. No bruits or masses. Good bowel sounds. Extremities: no cyanosis, clubbing, rash, tr edema warm surgical scars ok  Neuro: alert & orientedx3, cranial nerves grossly intact. moves all 4 extremities w/o difficulty. Affect pleasant   Telemetry   NSR 90-100  AF with RVR overnight Personally reviewed    EKG    None   Labs    CBC Recent Labs    03/27/18 0400 03/27/18 1708 03/28/18 0404  WBC 9.6  --   11.4*  HGB 8.5* 8.2* 7.9*  HCT 26.5* 24.0* 24.7*  MCV 95.3  --  95.0  PLT 145*  --  165   Basic Metabolic Panel Recent Labs    16/09/9603/05/19 1700 03/27/18 1708 03/28/18 0404  NA 138 138 134*  K 3.8 3.8 4.2  CL 103 103 100*  CO2 22  --  20*  GLUCOSE 236* 234* 263*  BUN 46* 41* 47*  CREATININE 2.51* 2.40* 2.63*  CALCIUM 8.2*  --  8.2*   Liver Function Tests No results for input(s): AST, ALT, ALKPHOS, BILITOT, PROT, ALBUMIN in the last 72 hours. No results for input(s): LIPASE, AMYLASE in the last 72 hours. Cardiac Enzymes No results for input(s): CKTOTAL, CKMB, CKMBINDEX, TROPONINI in the last 72 hours.  BNP: BNP (last 3 results) No results for input(s): BNP in the last 8760 hours.  ProBNP (last 3 results) Recent Labs    11/07/17 1214 03/02/18 1724 03/20/18 1534  PROBNP 11,244* 2,115.0* 04,54032,943*     D-Dimer No results for input(s): DDIMER in the last 72 hours. Hemoglobin A1C No results for input(s): HGBA1C in the last 72 hours. Fasting Lipid Panel No results for input(s): CHOL, HDL, LDLCALC, TRIG, CHOLHDL, LDLDIRECT in the last 72 hours. Thyroid Function Tests Recent Labs    03/28/18 1122  TSH 1.163    Other results:   Imaging  No results found.   Medications:     Scheduled Medications: . aspirin EC  81 mg Oral Daily  . atorvastatin  40 mg Oral Daily  . cycloSPORINE  1 drop Both Eyes BID  . famotidine  20 mg Oral Daily  . fluticasone  1 spray Each Nare Daily  . heparin  5,000 Units Subcutaneous Q8H  . insulin aspart  0-15 Units Subcutaneous TID WC  . insulin aspart  0-5 Units Subcutaneous QHS  . pantoprazole  40 mg Oral Daily  . probenecid  500 mg Oral BID  . sodium chloride flush  10-40 mL Intracatheter Q12H    Infusions: . sodium chloride    . sodium chloride    . amiodarone    . amiodarone 30 mg/hr (03/28/18 1400)  . furosemide Stopped (03/28/18 0934)  . milrinone 0.25 mcg/kg/min (03/28/18 1400)  . norepinephrine (LEVOPHED) Adult  infusion 14 mcg/min (03/28/18 1400)    PRN Medications: sodium chloride, acetaminophen, albuterol, diazepam, nitroGLYCERIN, ondansetron (ZOFRAN) IV, ondansetron (ZOFRAN) IV, sodium chloride flush    Patient Profile  Ms Damas is a 72 year old with a history of CAD S/P CABG x 3 in 2002 with stent 2003, chronic systolic heart failure, htn, hyperlipidemia, DM, and CKD.   Admitted with chest pain.    Assessment/Plan   1. A/C Systolic Heart Failure  - EF 20-25% due to ICM  - Initial RHC with markedly elevated biventricular filling pressures and low cardiac output - Todays CO-OX 51% on milrinone 0.25 mcg and NE 14 - CVP 13-14. Urine output minimal in setting of ATN. Increase lasix to 160 tid - Hold bb for now  - Off entresto. Stop hydralazine/imdur with hypotension.   - No dig with CKD   2. Acute on CKD IV - Creatinine baseline 1.7-1.9  - Today 2.6. Oliguric.  - Likely due to ATN from surgery and CHF +/- CIN - Continue pressors. Renal consulted - No acute indications for HD. Continue watchful waiting  3. PAF - Back in NSR on amio. Will continue  - Start heparin. Discussed dosing with PharmD personally.  4. Ischemic RLE - S/p 03/26/2018  Angioplasty and stenting of left common iliac stenosis with VBX stent. Left to right femfemoral bypass - [facility] following. Appreciate there assistance.   5. CAD - severe native CAD and graft disease on cath 03/24/18. No revasc options - No s/s angina currently.    Length of Stay: 8  Arvilla Meres, MD  03/28/2018, 2:32 PM  Advanced Heart Failure Team Pager [phone] (M-F; 7a - 4p)  Please contact [facility] for night-coverage after hours (4p -7a ) and weekends on [email or web address]

## 2018-03-28 NOTE — Progress Notes (Signed)
Pt found to be in Afib RVR. Cardiology paged and EKG obtained. Amio bolus given and amio gtt started.  Pressures remained stable on of Levophed.  Pt complaining of chest pain and "pain all over." at appx. 0400 pt converted back to NSR.  She reports "feeling better." Will continue to monitor.

## 2018-03-28 NOTE — Consult Note (Signed)
Renal Service Consult Note [facility] 03/28/2018 Sol Blazing Requesting Physician:  Dr Scot Dock  Reason for Consult:  Renal failure HPI: The patient is a 72 y.o. year-old with hx of HTN, CAD (CABG 2002, stenting in 2003), HLD, DM, depression, and CKD stage 3.  She was admitted 4/1 with chest pain, creat 1.8 (baseline 1.7- 1.9). She had NSTEMI and went for heart cath on 03/24/18.  Cath showed severe pulm HTN, low EF 20-25% w/ biventricular failure, and 2 of 3 of the bypass grafts were occluded, the LIMA to LAD was open.  LVEDP was 33.  She was diuresed and seen by CHF team.  They noted marked ^filling pressures and low CO, CVP 14.  Continued IV lasix and entresto, BP's were elevated. Milrinone started for low CO-OX on 4/3.  Pt then developed an acutely ischemic RLE and went to OR per VVS on 4/4 and underwent CO2 aortogram and bilat iliac arteriogram, PTA to L common iliac and PTA/ stent of the L common iliac.   Went to back to OR same day then for a left femoral to R deep femoral art bypass w/ 8 mm Dacron graft ( L > R fem-fem bypass).  Creat was stable in 1.8- 2.1 range until today w/ creat up to 2.63. UOP dropped off significantly and we are asked by Dr Haroldine Laws to see patient for acute on CRF.    Wt's are up from 71kg to 77 kg over the past week.  UOP yest was down to 245 cc, and so far today only 60 cc UOP.        ROS  denies CP  no joint pain   no HA  no blurry vision  no rash  no diarrhea  no nausea/ vomiting  no dysuria  no difficulty voiding  no change in urine color    Past Medical History  Past Medical History:  Diagnosis Date  . Arthritis   . CKD (chronic kidney disease)   . Coronary artery disease   . Depression   . Diabetes mellitus without complication ([place])   . Hyperlipidemia   . Hypertension   . Myocardial infarction [facility]) 2002   MI x 2 - one prior to 2002   Past Surgical History  Past Surgical History:  Procedure  Laterality Date  . ABDOMINAL AORTOGRAM N/A 03/26/2018   Procedure: ABDOMINAL AORTOGRAM;  Surgeon: Angelia Mould, MD;  Location: [place] [facility];  Service: Cardiovascular;  Laterality: N/A;  . ABDOMINAL HYSTERECTOMY    . CAROTID ENDARTERECTOMY Left 05/21/2001  . CHOLECYSTECTOMY    . CORONARY ANGIOPLASTY WITH STENT PLACEMENT  2003  . CORONARY ARTERY BYPASS GRAFT  2002  . EYE SURGERY Bilateral    cataract  . FEMORAL-FEMORAL BYPASS GRAFT Left 03/26/2018   Procedure: BYPASS GRAFT FEMORAL-FEMORAL ARTERY LEFT TO RIGHT;  Surgeon: Angelia Mould, MD;  Location: [place];  Service: Vascular;  Laterality: Left;  . LOWER EXTREMITY ANGIOGRAPHY Bilateral 03/26/2018   Procedure: Lower Extremity Angiography;  Surgeon: Angelia Mould, MD;  Location: [place] [facility];  Service: Cardiovascular;  Laterality: Bilateral;  . OVARIAN CYST REMOVAL    . PERIPHERAL VASCULAR INTERVENTION Left 03/26/2018   Procedure: PERIPHERAL VASCULAR INTERVENTION;  Surgeon: Angelia Mould, MD;  Location: [place] [facility];  Service: Cardiovascular;  Laterality: Left;  common iliac  . REVERSE SHOULDER ARTHROPLASTY Left 04/05/2014   Procedure: LEFT REVERSE SHOULDER ARTHROPLASTY;  Surgeon: Marin Shutter, MD;  Location:  [place] [facility];  Service: Orthopedics;  Laterality: Left;  . RIGHT/LEFT HEART CATH AND CORONARY/GRAFT ANGIOGRAPHY N/A 03/24/2018   Procedure: RIGHT/LEFT HEART CATH AND CORONARY/GRAFT ANGIOGRAPHY;  Surgeon: Troy Sine, MD;  Location: [place] [facility];  Service: Cardiovascular;  Laterality: N/A;  . TUBAL LIGATION     Family History  Family History  Problem Relation Age of Onset  . Heart attack Father 53   Social History  reports that she quit smoking about 19 years ago. Her smoking use included cigarettes. She has a 27.00 pack-year smoking history. She has never used smokeless tobacco. She reports that she drinks alcohol. She reports that she does not use drugs. Allergies  Allergies  Allergen  Reactions  . Codeine Shortness Of Breath  . Coreg [Carvedilol] Shortness Of Breath and Cough  . Metoprolol Nausea And Vomiting    "Throws up white, foamy liquid"  . Percocet [Oxycodone-Acetaminophen] Nausea And Vomiting  . Hydrocodone Cough    This was in a cough syrup that made her cough WORSE  . Benazepril Cough   Home medications Prior to Admission medications   Medication Sig Start Date End Date Taking? Authorizing Provider  aspirin 81 MG tablet Take 81 mg by mouth daily.   Yes [provider]  atorvastatin (LIPITOR) 40 MG tablet Take 40 mg by mouth at bedtime.    Yes [provider]  bisoprolol (ZEBETA) 5 MG tablet One twice daily Patient taking differently: Take 5 mg by mouth 2 (two) times daily.  03/02/18  Yes Tanda Rockers, MD  cycloSPORINE (RESTASIS) 0.05 % ophthalmic emulsion Place 1 drop into both eyes 2 (two) times daily.   Yes [provider]  fluticasone (FLONASE) 50 MCG/ACT nasal spray Place 1 spray into both nostrils at bedtime.    Yes [provider]  furosemide (LASIX) 80 MG tablet Take one tablet by mouth every morning.  Take 1/2 tablet ('40mg'$ ) by mouth every evening. Patient taking differently: 40-80 mg See admin instructions. Take 80 mg by mouth in the morning and 40 mg in the late afternoon 03/13/18  Yes Belva Crome, MD  glimepiride (AMARYL) 1 MG tablet Take 1 mg by mouth daily with breakfast.   Yes [provider]  metFORMIN (GLUCOPHAGE-XR) 500 MG 24 hr tablet Take 1,000 mg by mouth daily.    Yes [provider]  nitroGLYCERIN (NITROSTAT) 0.4 MG SL tablet Place 0.4 mg under the tongue every 5 (five) minutes x 3 doses as needed. (chest pain) 01/08/16  Yes [provider]  Omega-3 Fatty Acids (OMEGA 3 PO) Take 2 capsules by mouth daily.    Yes [provider]  pantoprazole (PROTONIX) 40 MG tablet Take 1 tablet (40 mg total) by mouth daily. Take 30-60 min before first meal of the day 03/02/18  Yes  Tanda Rockers, MD  probenecid (BENEMID) 500 MG tablet Take 500 mg by mouth 2 (two) times daily.   Yes [provider]  sacubitril-valsartan (ENTRESTO) 24-26 MG Take 1 tablet by mouth 2 (two) times daily. 03/13/18  Yes Belva Crome, MD  albuterol (PROVENTIL HFA;VENTOLIN HFA) 108 [phone] Base) MCG/ACT inhaler Inhale 2 puffs into the lungs every six hours as needed for shortness of breath or wheezing 01/29/18   [provider]  isosorbide mononitrate (IMDUR) 30 MG 24 hr tablet Take 1 tablet (30 mg total) by mouth daily. 03/20/18 06/18/18  Isaiah Serge, NP   Liver Function Tests No results for input(s): AST, ALT, ALKPHOS, BILITOT, PROT,  ALBUMIN in the last 168 hours. No results for input(s): LIPASE, AMYLASE in the last 168 hours. CBC Recent Labs  Lab 03/26/18 0506 03/27/18 0400 03/27/18 1708 03/28/18 0404  WBC 6.9 9.6  --  11.4*  HGB 9.8* 8.5* 8.2* 7.9*  HCT 30.1* 26.5* 24.0* 24.7*  MCV 94.1 95.3  --  95.0  PLT 160 145*  --  579   Basic Metabolic Panel Recent Labs  Lab 03/24/18 0205 03/25/18 0910 03/26/18 0506 03/27/18 0400 03/27/18 1616 03/27/18 1700 03/27/18 1708 03/28/18 0404  NA 138 138 136 139 142 138 138 134*  K 4.0 3.8 3.9 3.8 2.1* 3.8 3.8 4.2  CL 100* 101 99* 103 121* 103 103 100*  CO2 _0 13* 22  --  20*  GLUCOSE 175* 201* 177* 172* 139* 236* 234* 263*  BUN 51* 43* 46* 42* 29* 46* 41* 47*  CREATININE 2.09* 1.99* 2.11* 2.14* 1.40* 2.51* 2.40* 2.63*  CALCIUM 8.8* 9.2 8.9 8.4* 4.8* 8.2*  --  8.2*   Iron/TIBC/Ferritin/ %Sat No results found for: IRON, TIBC, FERRITIN, IRONPCTSAT  Vitals:   03/28/18 1300 03/28/18 1400 03/28/18 1500 03/28/18 1735  BP: (!) 119/48  (!) 116/52   Pulse: 83  83   Resp: _1 Temp:    98 F (36.7 C)  TempSrc:    Oral  SpO2: 98%  100%   Weight:      Height:       Exam Gen pale elderly pleasant WF No rash, cyanosis or gangrene Sclera anicteric, throat clear  No jvd or bruits Chest bibasilar rales 1/4  up, occ rhonchi Reg rate and rhythm Abd soft ntnd no mass or ascites +bs obese GU defer MS no joint effusions or deformity Ext 2+ pitting bilat LE edema more so in dependent area/ hips/ flanks No LE wounds or ulcers Neuro is alert, Ox 3 , nf, gen'd weakness  Home meds: - bisoprolol bid/ lasix 80 am + 40 pm/ entresto 24-26 bid - imdur 30 qd/ sl ntg prn/ lipitor/ ecasa - metformin 500 qd xr/ amaryl 1 mg am - PPI/ probenecid/ albuterol/ vitamins/ supplements/    UA 01/29/17 > prot 100, 0-5 wbc/ rbc, no bact/ epis, 1.004, 6.0 ph CXR 3/29 > very small effusion, CM  Impression: 1 AKI on CKD 3 - UOP dropping off last 24 hrs, likely ATN from hypoperfusion/ hypotension/ CHF +/- IV contrast.   Will get CXR, updated UA, will follow.  Weights increasing daily since UOP dropping. CRRT soon as needed for vol overload.  2  CKD IV - baseline creat 1.7- 1.9 w baseline eGFR 25- 2.9 ml/min 3  NSTEMI / hx prior CABG 2002 - sp heart cath 4/2, severe bivent HF, on milrinone gtt 4  HTN - BP's dropped, antiHTNsive's dc'd 5  Ischemic RLE - on 4/4 underwent  PTA / stenting L iliac stenosis, and L > R fem-fem bypass also on 4/4    Plan - as above  Kelly Splinter MD [place] pager [phone]   03/28/2018, 6:23 PM '

## 2018-03-28 NOTE — Progress Notes (Signed)
ANTICOAGULATION CONSULT NOTE - Initial Consult  Pharmacy Consult for Heparin  Indication: atrial fibrillation  Allergies  Allergen Reactions  . Codeine Shortness Of Breath  . Coreg [Carvedilol] Shortness Of Breath and Cough  . Metoprolol Nausea And Vomiting    "Throws up white, foamy liquid"  . Percocet [Oxycodone-Acetaminophen] Nausea And Vomiting  . Hydrocodone Cough    This was in a cough syrup that made her cough WORSE  . Benazepril Cough    Patient Measurements: Height: 4\' 9"  (144.8 cm) Weight: 169 lb 5 oz (76.8 kg) IBW/kg (Calculated) : 38.6 Heparin dose wt 60kg  Vital Signs: Temp: 98.1 F (36.7 C) (04/06 1228) Temp Source: Oral (04/06 1228) BP: 119/48 (04/06 1300) Pulse Rate: 83 (04/06 1300)  Labs: Recent Labs    03/26/18 0506 03/27/18 0400  03/27/18 1700 03/27/18 1708 03/28/18 0404  HGB 9.8* 8.5*  --   --  8.2* 7.9*  HCT 30.1* 26.5*  --   --  24.0* 24.7*  PLT 160 145*  --   --   --  165  CREATININE 2.11* 2.14*   < > 2.51* 2.40* 2.63*   < > = values in this interval not displayed.    Estimated Creatinine Clearance: 16.2 mL/min (A) (by C-G formula based on SCr of 2.63 mg/dL (H)).   Medical History: Past Medical History:  Diagnosis Date  . Arthritis   . CKD (chronic kidney disease)   . Coronary artery disease   . Depression   . Diabetes mellitus without complication ([facility])   . Hyperlipidemia   . Hypertension   . Myocardial infarction ([facility]) 2002   MI x 2 - one prior to 2002      Assessment: 71yof admitted with HF on milrinone 0.13125mcg/kg/min and NE 14 mcg/min for BP support.  S/p fem pop bypass.  New Afib overnight started on amiodarone drip .  Will start heparin drip for anticoagulation.  No bleeding but h/h drifting down will watch  pltc 200s   Goal of Therapy:  Heparin level 0.3-0.7 units/ml Monitor platelets by anticoagulation protocol: Yes   Plan:  Heparin drip 750 uts/hr  Draw HL 6hr after start Daily HL, CBC    Hailey Levine  Pharm.D. CPP, BCPS Clinical Pharmacist [phone] 03/28/2018 3:06 PM

## 2018-03-28 NOTE — Progress Notes (Signed)
Cardiology  Was called to the bedside for patient having narrow complex tachycardia which was irregularly, irregular likely atrial fibrillation with HR's in the 170-180's. Patient is currently on milrinone and levophed. Will administer amiodarone gtt, initially a bolus then a gtt. Will need to initiate anticoagulation once ok with surgery. Will also attempt to wean the levophed. Another consider is adding dig, but we will need to be extremely careful given her age and renal function. Will continue the amiodarone for now. Beta blockers is not ideal given that she has decompensated HF. Will continue to monitor.  Hailey AndreasHarish Colletta Spillers Cards fellow

## 2018-03-28 NOTE — Progress Notes (Signed)
ANTICOAGULATION CONSULT NOTE - Follow-Up Consult  Pharmacy Consult for Heparin  Indication: atrial fibrillation  Allergies  Allergen Reactions  . Codeine Shortness Of Breath  . Coreg [Carvedilol] Shortness Of Breath and Cough  . Metoprolol Nausea And Vomiting    "Throws up white, foamy liquid"  . Percocet [Oxycodone-Acetaminophen] Nausea And Vomiting  . Hydrocodone Cough    This was in a cough syrup that made her cough WORSE  . Benazepril Cough    Patient Measurements: Height: 4\' 9"  (144.8 cm) Weight: 169 lb 5 oz (76.8 kg) IBW/kg (Calculated) : 38.6 Heparin dose wt 60kg  Vital Signs: Temp: 98.2 F (36.8 C) (04/06 1900) Temp Source: Oral (04/06 1900) BP: 117/93 (04/06 2100) Pulse Rate: 83 (04/06 2100)  Labs: Recent Labs    03/26/18 0506 03/27/18 0400  03/27/18 1700 03/27/18 1708 03/28/18 0404 03/28/18 2100  HGB 9.8* 8.5*  --   --  8.2* 7.9*  --   HCT 30.1* 26.5*  --   --  24.0* 24.7*  --   PLT 160 145*  --   --   --  165  --   HEPARINUNFRC  --   --   --   --   --   --  0.37  CREATININE 2.11* 2.14*   < > 2.51* 2.40* 2.63*  --    < > = values in this interval not displayed.    Estimated Creatinine Clearance: 16.2 mL/min (A) (by C-G formula based on SCr of 2.63 mg/dL (H)).   Medical History: Past Medical History:  Diagnosis Date  . Arthritis   . CKD (chronic kidney disease)   . Coronary artery disease   . Depression   . Diabetes mellitus without complication ([facility])   . Hyperlipidemia   . Hypertension   . Myocardial infarction ([facility]) 2002   MI x 2 - one prior to 2002      Assessment: 72 yo F started on heparin on 4/6 for new afib.  Heparin is therapeutic on 750 units/hr.  No bleeding noted.  Goal of Therapy:  Heparin level 0.3-0.7 units/ml Monitor platelets by anticoagulation protocol: Yes   Plan:  Continue heparin at 750 units/hr  Next heparin level with AM labs for confirmation. Daily heparin level and CBC    Lailyn Appelbaum, Pharm.D.,  BCPS Clinical Pharmacist Pager: [phone] 03/28/2018 10:01 PM

## 2018-03-29 ENCOUNTER — Inpatient Hospital Stay (HOSPITAL_COMMUNITY): Payer: Medicare HMO

## 2018-03-29 LAB — RENAL FUNCTION PANEL
ALBUMIN: 2.4 g/dL — AB (ref 3.5–5.0)
ANION GAP: 17 — AB (ref 5–15)
BUN: 56 mg/dL — ABNORMAL HIGH (ref 6–20)
CALCIUM: 8.2 mg/dL — AB (ref 8.9–10.3)
CO2: 16 mmol/L — AB (ref 22–32)
Chloride: 96 mmol/L — ABNORMAL LOW (ref 101–111)
Creatinine, Ser: 3.75 mg/dL — ABNORMAL HIGH (ref 0.44–1.00)
GFR calc Af Amer: 13 mL/min — ABNORMAL LOW (ref >60)
GFR calc non Af Amer: 11 mL/min — ABNORMAL LOW (ref >60)
GLUCOSE: 203 mg/dL — AB (ref 65–99)
PHOSPHORUS: 6.4 mg/dL — AB (ref 2.5–4.6)
POTASSIUM: 4.4 mmol/L (ref 3.5–5.1)
SODIUM: 129 mmol/L — AB (ref 135–145)

## 2018-03-29 LAB — COOXEMETRY PANEL
Carboxyhemoglobin: 1.7 % — ABNORMAL HIGH (ref 0.5–1.5)
Methemoglobin: 0.9 % (ref 0.0–1.5)
O2 SAT: 58.3 %
TOTAL HEMOGLOBIN: 7.9 g/dL — AB (ref 12.0–16.0)

## 2018-03-29 LAB — GLUCOSE, CAPILLARY
GLUCOSE-CAPILLARY: 214 mg/dL — AB (ref 65–99)
GLUCOSE-CAPILLARY: 161 mg/dL — AB (ref 65–99)
Glucose-Capillary: 167 mg/dL — ABNORMAL HIGH (ref 65–99)
Glucose-Capillary: 214 mg/dL — ABNORMAL HIGH (ref 65–99)

## 2018-03-29 LAB — BASIC METABOLIC PANEL
Anion gap: 17 — ABNORMAL HIGH (ref 5–15)
BUN: 52 mg/dL — ABNORMAL HIGH (ref 6–20)
CALCIUM: 8.4 mg/dL — AB (ref 8.9–10.3)
CO2: 19 mmol/L — ABNORMAL LOW (ref 22–32)
CREATININE: 3.34 mg/dL — AB (ref 0.44–1.00)
Chloride: 96 mmol/L — ABNORMAL LOW (ref 101–111)
GFR calc non Af Amer: 13 mL/min — ABNORMAL LOW (ref >60)
GFR, EST AFRICAN AMERICAN: 15 mL/min — AB (ref >60)
Glucose, Bld: 209 mg/dL — ABNORMAL HIGH (ref 65–99)
Potassium: 4.3 mmol/L (ref 3.5–5.1)
SODIUM: 132 mmol/L — AB (ref 135–145)

## 2018-03-29 LAB — CBC
HCT: 23.6 % — ABNORMAL LOW (ref 36.0–46.0)
Hemoglobin: 7.8 g/dL — ABNORMAL LOW (ref 12.0–15.0)
MCH: 30.6 pg (ref 26.0–34.0)
MCHC: 33.1 g/dL (ref 30.0–36.0)
MCV: 92.5 fL (ref 78.0–100.0)
Platelets: 159 10*3/uL (ref 150–400)
RBC: 2.55 MIL/uL — ABNORMAL LOW (ref 3.87–5.11)
RDW: 17.2 % — AB (ref 11.5–15.5)
WBC: 12.1 10*3/uL — ABNORMAL HIGH (ref 4.0–10.5)

## 2018-03-29 LAB — HEPARIN LEVEL (UNFRACTIONATED): Heparin Unfractionated: 0.52 IU/mL (ref 0.30–0.70)

## 2018-03-29 MED ORDER — PRISMASOL BGK 4/2.5 32-4-2.5 MEQ/L IV SOLN
INTRAVENOUS | Status: DC
  Administered 2018-03-29 – 2018-04-03 (×39): via INTRAVENOUS_CENTRAL
  Filled 2018-03-29 (×50): qty 5000

## 2018-03-29 MED ORDER — HEPARIN SODIUM (PORCINE) 1000 UNIT/ML DIALYSIS
1000.0000 [IU] | INTRAMUSCULAR | Status: DC | PRN
  Administered 2018-03-29 – 2018-04-03 (×2): 2400 [IU] via INTRAVENOUS_CENTRAL
  Filled 2018-03-29 (×2): qty 6
  Filled 2018-03-29: qty 3
  Filled 2018-03-29 (×2): qty 6

## 2018-03-29 MED ORDER — PRISMASOL BGK 4/2.5 32-4-2.5 MEQ/L IV SOLN
INTRAVENOUS | Status: DC
  Administered 2018-03-29 – 2018-04-03 (×5): via INTRAVENOUS_CENTRAL
  Filled 2018-03-29 (×7): qty 5000

## 2018-03-29 MED ORDER — FENTANYL CITRATE (PF) 100 MCG/2ML IJ SOLN
INTRAMUSCULAR | Status: AC
  Administered 2018-03-29: 25 ug
  Filled 2018-03-29: qty 2

## 2018-03-29 MED ORDER — SORBITOL 70 % SOLN
30.0000 mL | Freq: Once | Status: AC
  Administered 2018-03-29: 30 mL via ORAL
  Filled 2018-03-29: qty 30

## 2018-03-29 MED ORDER — DOCUSATE SODIUM 100 MG PO CAPS
200.0000 mg | ORAL_CAPSULE | Freq: Two times a day (BID) | ORAL | Status: DC | PRN
Start: 2018-03-29 — End: 2018-04-06

## 2018-03-29 MED ORDER — PRISMASOL BGK 4/2.5 32-4-2.5 MEQ/L IV SOLN
INTRAVENOUS | Status: DC
  Administered 2018-03-29 – 2018-04-03 (×9): via INTRAVENOUS_CENTRAL
  Filled 2018-03-29 (×12): qty 5000

## 2018-03-29 MED ORDER — BISACODYL 5 MG PO TBEC
10.0000 mg | DELAYED_RELEASE_TABLET | Freq: Every day | ORAL | Status: DC | PRN
  Administered 2018-03-30 – 2018-03-31 (×2): 10 mg via ORAL
  Filled 2018-03-29 (×2): qty 2

## 2018-03-29 MED ORDER — BISACODYL 10 MG RE SUPP: 10.0000 mg | Freq: Every day | RECTAL | Status: DC | PRN

## 2018-03-29 MED ORDER — MIDAZOLAM HCL 2 MG/2ML IJ SOLN
INTRAMUSCULAR | Status: AC
  Administered 2018-03-29: 1 mg
  Filled 2018-03-29: qty 2

## 2018-03-29 MED ORDER — SODIUM CHLORIDE 0.9 % FOR CRRT
INTRAVENOUS_CENTRAL | Status: DC | PRN
  Filled 2018-03-29: qty 1000

## 2018-03-29 MED ORDER — ALTEPLASE 2 MG IJ SOLR: 2.0000 mg | Freq: Once | INTRAMUSCULAR | Status: DC | PRN

## 2018-03-29 MED ORDER — ORAL CARE MOUTH RINSE
15.0000 mL | Freq: Two times a day (BID) | OROMUCOSAL | Status: DC
  Administered 2018-03-29 – 2018-04-06 (×16): 15 mL via OROMUCOSAL

## 2018-03-29 NOTE — Progress Notes (Signed)
Pt placed on venturi mask due to desat following sedation for a procedure.  Sat improved to 97%.  RN and MD at bedside.

## 2018-03-29 NOTE — Progress Notes (Signed)
ANTICOAGULATION CONSULT NOTE Follow Up Consult  Pharmacy Consult for Heparin  Indication: atrial fibrillation  Allergies  Allergen Reactions  . Codeine Shortness Of Breath  . Coreg [Carvedilol] Shortness Of Breath and Cough  . Metoprolol Nausea And Vomiting    "Throws up white, foamy liquid"  . Percocet [Oxycodone-Acetaminophen] Nausea And Vomiting  . Hydrocodone Cough    This was in a cough syrup that made her cough WORSE  . Benazepril Cough    Patient Measurements: Height: 4\' 9"  (144.8 cm) Weight: 170 lb 10.2 oz (77.4 kg) IBW/kg (Calculated) : 38.6 Heparin dose wt 60kg  Vital Signs: Temp: 97.4 F (36.3 C) (04/07 1939) Temp Source: Oral (04/07 1939) BP: 97/38 (04/07 1615) Pulse Rate: 82 (04/07 1800)  Labs: Recent Labs    03/27/18 0400  03/27/18 1708 03/28/18 0404 03/28/18 2100 03/29/18 0428 03/29/18 1709  HGB 8.5*  --  8.2* 7.9*  --  7.8*  --   HCT 26.5*  --  24.0* 24.7*  --  23.6*  --   PLT 145*  --   --  165  --  159  --   HEPARINUNFRC  --   --   --   --  0.37 0.52  --   CREATININE 2.14*   < > 2.40* 2.63*  --  3.34* 3.75*   < > = values in this interval not displayed.    Estimated Creatinine Clearance: 11.4 mL/min (A) (by C-G formula based on SCr of 3.75 mg/dL (H)).   Medical History: Past Medical History:  Diagnosis Date  . Arthritis   . CKD (chronic kidney disease)   . Coronary artery disease   . Depression   . Diabetes mellitus without complication ([facility]CC)   . Hyperlipidemia   . Hypertension   . Myocardial infarction ([facility]) 2002   MI x 2 - one prior to 2002      Assessment: 71yof admitted with HF on milrinone 0.16325mcg/kg/min and NE 16 mcg/min for BP support.  S/p fem pop bypass asa and statin.  New Afib was noted and heparin drip started for anticoagulation  -heparin stopped earlier today for HD access ato restart at 7:50pm  Goal of Therapy:  Heparin level 0.3-0.7 units/ml Monitor platelets by anticoagulation protocol: Yes   Plan:  Restart  Heparin drip 650 units/hr  Daily HL, CBC   Harland GermanAndrew Shivank Pinedo, PharmD Clinical Pharmacist 03/29/2018 7:47 PM

## 2018-03-29 NOTE — Progress Notes (Addendum)
  Progress Note    03/29/2018 9:52 AM 3 Days Post-Op  Subjective:  No new complaints this morning   Vitals:   03/29/18 0700 03/29/18 0823  BP:    Pulse: 93   Resp: 12   Temp:  98.7 F (37.1 C)  SpO2: 98%    Physical Exam: Lungs:  No inc resp effort Incisions:  B groin incisions with incisional vac in place with good seal Extremities:  ATA and PTA symmetrical by doppler; feet warm to touch; pitting edema BLE to level of knee Abdomen:  Soft Neurologic: somnolent today  CBC    Component Value Date/Time   WBC 12.1 (H) 03/29/2018 0428   RBC 2.55 (L) 03/29/2018 0428   HGB 7.8 (L) 03/29/2018 0428   HCT 23.6 (L) 03/29/2018 0428   PLT 159 03/29/2018 0428   MCV 92.5 03/29/2018 0428   MCH 30.6 03/29/2018 0428   MCHC 33.1 03/29/2018 0428   RDW 17.2 (H) 03/29/2018 0428   LYMPHSABS 1.2 03/02/2018 1724   MONOABS 0.5 03/02/2018 1724   EOSABS 0.1 03/02/2018 1724   BASOSABS 0.0 03/02/2018 1724    BMET    Component Value Date/Time   NA 132 (L) 03/29/2018 0428   NA 137 03/20/2018 1534   K 4.3 03/29/2018 0428   CL 96 (L) 03/29/2018 0428   CO2 19 (L) 03/29/2018 0428   GLUCOSE 209 (H) 03/29/2018 0428   BUN 52 (H) 03/29/2018 0428   BUN 44 (H) 03/20/2018 1534   CREATININE 3.34 (H) 03/29/2018 0428   CREATININE 1.65 (H) 05/17/2016 1356   CALCIUM 8.4 (L) 03/29/2018 0428   GFRNONAA 13 (L) 03/29/2018 0428   GFRAA 15 (L) 03/29/2018 0428    INR    Component Value Date/Time   INR 1.17 03/23/2018 0732     Intake/Output Summary (Last 24 hours) at 03/29/2018 0952 Last data filed at 03/29/2018 0700 Gross per 24 hour  Intake 1809.91 ml  Output 180 ml  Net 1629.91 ml     Assessment/Plan:  72 y.o. female is s/p left CIA stent and L to R fem-fem bypass  3 Days Post-Op   Continue incisional vac B groins for at least 7 days post op Patent L iliac stents and bypass given pedal signals by doppler Ok for OOB from vascular standpoint Defer cardiac to heart failure team Nephrology  consulted due to decreased UOP and worsening renal function    Emilie RutterMatthew Eveland, PA-C [facility] [phone] 03/29/2018 9:52 AM  I agree with the above.  I have seen and examined the patient.  Durene CalWells Lucelia Lacey

## 2018-03-29 NOTE — Plan of Care (Signed)
  Problem: Education: Goal: Knowledge of General Education information will improve Outcome: Progressing   

## 2018-03-29 NOTE — Progress Notes (Signed)
[facility] Progress Note  Subjective: no new c/o's, no sig SOB. CXR yest was clear. UOP 210, wt's up slightly  Vitals:   03/29/18 0500 03/29/18 0600 03/29/18 0700 03/29/18 0823  BP:  (!) 135/57    Pulse: 91 89 93   Resp: '18 19 12   ' Temp:    98.7 F (37.1 C)  TempSrc:    Oral  SpO2: 98% 99% 98%   Weight:  77.4 kg (170 lb 10.2 oz)    Height:        Inpatient medications: . aspirin EC  81 mg Oral Daily  . atorvastatin  40 mg Oral Daily  . cycloSPORINE  1 drop Both Eyes BID  . famotidine  20 mg Oral Daily  . fluticasone  1 spray Each Nare Daily  . insulin aspart  0-15 Units Subcutaneous TID WC  . insulin aspart  0-5 Units Subcutaneous QHS  . pantoprazole  40 mg Oral Daily  . sodium chloride flush  10-40 mL Intracatheter Q12H   . sodium chloride    . sodium chloride 10 mL/hr at 03/28/18 1900  . amiodarone 30 mg/hr (03/29/18 0248)  . furosemide Stopped (03/28/18 2204)  . heparin 750 Units/hr (03/28/18 1900)  . milrinone 0.125 mcg/kg/min (03/28/18 1900)  . norepinephrine (LEVOPHED) Adult infusion 16 mcg/min (03/29/18 0935)   sodium chloride, acetaminophen, albuterol, diazepam, nitroGLYCERIN, ondansetron (ZOFRAN) IV, ondansetron (ZOFRAN) IV, sodium chloride flush  Exam: Gen pale elderly pleasant WF lying bed, no distress No jvd or bruits Chest scattered rhonchi, no wheezing Reg rate and rhythm Abd soft ntnd no mass or ascites +bs obese Ext 2+ bilat LE pitting edema Neuro is alert, Ox 3 , nf, gen'd weakness  Home meds: - bisoprolol bid/ lasix 80 am + 40 pm/ entresto 24-26 bid - imdur 30 qd/ sl ntg prn/ lipitor/ ecasa - metformin 500 qd xr/ amaryl 1 mg am - PPI/ probenecid/ albuterol/ vitamins/ supplements/    UA 01/29/17 > prot 100, 0-5 wbc/ rbc, no bact/ epis, 1.004, 6.0 ph CXR 3/29 > very small effusion, CM CXR 4/6 > clear, no edema  Impression: 1 AKI on CKD 3 - likely ATN from hypoperfusion/ hypotension/ CHF +/- IV contrast. Had contrast on 4/2  w/ heart cath but stable creat after. Had some dye on 4/4 w/ vasc surgery/ procedure as well.  Now UOP down, creat up, not uremic as this time. Will follow.   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51* 2.40* 2.63* 3.34*  CALCIUM 8.2*  --  8.2* 8.4*   No results for input(s): AST, ALT, ALKPHOS, BILITOT, PROT, ALBUMIN in the last 168 hours. Recent Labs  Lab 03/27/18 0400 03/27/18 1708 03/28/18 0404 03/29/18  0428  WBC 9.6  --  11.4* 12.1*  HGB 8.5* 8.2* 7.9* 7.8*  HCT 26.5* 24.0* 24.7* 23.6*  MCV 95.3  --  95.0 92.5  PLT 145*  --  165 159   Iron/TIBC/Ferritin/ %Sat No results found for: IRON, TIBC, FERRITIN, IRONPCTSAT

## 2018-03-29 NOTE — Procedures (Signed)
Central Venous Catheter Insertion Procedure Note Eber JonesMarilyn P Shiffler [id number] 05/06/1946  Procedure: Insertion of Central Venous Catheter Indications: Hemodialysi  Procedure Details Consent: Risks of procedure as well as the alternatives and risks of each were explained to the (patient/caregiver).  Consent for procedure obtained. Time Out: Verified patient identification, verified procedure, site/side was marked, verified correct patient position, special equipment/implants available, medications/allergies/relevent history reviewed, required imaging and test results available.  Performed  Maximum sterile technique was used including antiseptics, cap, gloves, gown, hand hygiene, mask and sheet. Skin prep: Chlorhexidine; local anesthetic administered A antimicrobial bonded/coated triple lumen catheter was placed in the right internal jugular vein using the Seldinger technique.  Evaluation Blood flow good Complications: No apparent complications Patient did tolerate procedure well. Chest X-ray ordered to verify placement.  CXR: normal.  Arvilla Meresaniel Tula Schryver MD 03/29/2018, 4:41 PM

## 2018-03-29 NOTE — Progress Notes (Signed)
ANTICOAGULATION CONSULT NOTE Follow Up Consult  Pharmacy Consult for Heparin  Indication: atrial fibrillation  Allergies  Allergen Reactions  . Codeine Shortness Of Breath  . Coreg [Carvedilol] Shortness Of Breath and Cough  . Metoprolol Nausea And Vomiting    "Throws up white, foamy liquid"  . Percocet [Oxycodone-Acetaminophen] Nausea And Vomiting  . Hydrocodone Cough    This was in a cough syrup that made her cough WORSE  . Benazepril Cough    Patient Measurements: Height: 4\' 9"  (144.8 cm) Weight: 170 lb 10.2 oz (77.4 kg) IBW/kg (Calculated) : 38.6 Heparin dose wt 60kg  Vital Signs: Temp: 98.5 F (36.9 C) (04/07 1249) Temp Source: Oral (04/07 1249) BP: 135/57 (04/07 0600) Pulse Rate: 93 (04/07 0700)  Labs: Recent Labs    03/27/18 0400  03/27/18 1708 03/28/18 0404 03/28/18 2100 03/29/18 0428  HGB 8.5*  --  8.2* 7.9*  --  7.8*  HCT 26.5*  --  24.0* 24.7*  --  23.6*  PLT 145*  --   --  165  --  159  HEPARINUNFRC  --   --   --   --  0.37 0.52  CREATININE 2.14*   < > 2.40* 2.63*  --  3.34*   < > = values in this interval not displayed.    Estimated Creatinine Clearance: 12.8 mL/min (A) (by C-G formula based on SCr of 3.34 mg/dL (H)).   Medical History: Past Medical History:  Diagnosis Date  . Arthritis   . CKD (chronic kidney disease)   . Coronary artery disease   . Depression   . Diabetes mellitus without complication (HCC)   . Hyperlipidemia   . Hypertension   . Myocardial infarction (HCC) 2002   MI x 2 - one prior to 2002      Assessment: 71yof admitted with HF on milrinone 0.11025mcg/kg/min and NE 16 mcg/min for BP support.  S/p fem pop bypass asa and statin.  New Afib overnight started on amiodarone drip > now in SR.  Heparin drip started for anticoagulation  Drip rate 750units/hr HL drifted up o.3 > 0.5 with renal dysfunction and slowly down trending.  H/h will decrease rate.  No bleeding   pltc ok   Goal of Therapy:  Heparin level 0.3-0.7  units/ml Monitor platelets by anticoagulation protocol: Yes   Plan:  Decrease Heparin drip 650 uts/hr  Daily HL, CBC    Leota SauersLisa Tarig Zimmers Pharm.D. CPP, BCPS Clinical Pharmacist [phone] 03/29/2018 1:49 PM

## 2018-03-29 NOTE — Progress Notes (Signed)
Advanced Heart Failure Rounding Note  PCP-Cardiologist: Lesleigh Noe, MD   Subjective:    03/26/2018 S/P Angioplasty and stenting of left common iliac stenosis with VBX stent.Left to right femorofemoral bypass.   Had AF overnight on 4/6. IV amio started.  Remains on milrinone 0.125 and NE 16 Co-ox 58%  CVP 21  Creatinine up 2.6-> 3.3  Remains oliguric despite high dose lasix (160 IV TID) . Weight up 1 pound overnight. (10 pounds total)  Sitting up in bed. Feels weak. + worsening cough. + orthopnea. No CP. Family feels she is less alert.   Objective:   Weight Range: 77.4 kg (170 lb 10.2 oz) Body mass index is 36.93 kg/m.   Vital Signs:   Temp:  [98 F (36.7 C)-98.7 F (37.1 C)] 98.5 F (36.9 C) (04/07 1249) Pulse Rate:  [78-94] 93 (04/07 0700) Resp:  [11-26] 12 (04/07 0700) BP: (105-135)/(41-107) 135/57 (04/07 0600) SpO2:  [84 %-100 %] 98 % (04/07 0700) Arterial Line BP: (81-124)/(40-78) 120/48 (04/07 0700) Weight:  [77.4 kg (170 lb 10.2 oz)] 77.4 kg (170 lb 10.2 oz) (04/07 0600) Last BM Date: 03/21/18  Weight change: Filed Weights   03/27/18 0500 03/28/18 0458 03/29/18 0600  Weight: 72.6 kg (160 lb 0.9 oz) 76.8 kg (169 lb 5 oz) 77.4 kg (170 lb 10.2 oz)    Intake/Output:   Intake/Output Summary (Last 24 hours) at 03/29/2018 1451 Last data filed at 03/29/2018 0700 Gross per 24 hour  Intake 1077.2 ml  Output 150 ml  Net 927.2 ml      Physical Exam   General:  Sitting up in bed. Frequent cough Weak HEENT: normal Neck: supple. JVP to ear Carotids 2+ bilat; no bruits. No lymphadenopathy or thryomegaly appreciated. Cor: PMI laterally displaced. Regular rate & rhythm. No rubs, gallops or murmurs. Lungs: basilar crackles  Abdomen: soft, nontender, nondistended. No hepatosplenomegaly. No bruits or masses. Good bowel sounds. Extremities: no cyanosis, clubbing, rash, 3+ edema  Warm surgical scars ok  Neuro: alert & orientedx3, cranial nerves grossly intact. moves  all 4 extremities w/o difficulty. No asterixis Affect pleasant   Telemetry   NSR 90-100 Personally reviewed    EKG    None   Labs    CBC Recent Labs    03/28/18 0404 03/29/18 0428  WBC 11.4* 12.1*  HGB 7.9* 7.8*  HCT 24.7* 23.6*  MCV 95.0 92.5  PLT 165 159   Basic Metabolic Panel Recent Labs    16/10/96 0404 03/29/18 0428  NA 134* 132*  K 4.2 4.3  CL 100* 96*  CO2 20* 19*  GLUCOSE 263* 209*  BUN 47* 52*  CREATININE 2.63* 3.34*  CALCIUM 8.2* 8.4*   Liver Function Tests No results for input(s): AST, ALT, ALKPHOS, BILITOT, PROT, ALBUMIN in the last 72 hours. No results for input(s): LIPASE, AMYLASE in the last 72 hours. Cardiac Enzymes No results for input(s): CKTOTAL, CKMB, CKMBINDEX, TROPONINI in the last 72 hours.  BNP: BNP (last 3 results) No results for input(s): BNP in the last 8760 hours.  ProBNP (last 3 results) Recent Labs    11/07/17 1214 03/02/18 1724 03/20/18 1534  PROBNP 11,244* 2,115.0* 04,540*     D-Dimer No results for input(s): DDIMER in the last 72 hours. Hemoglobin A1C No results for input(s): HGBA1C in the last 72 hours. Fasting Lipid Panel No results for input(s): CHOL, HDL, LDLCALC, TRIG, CHOLHDL, LDLDIRECT in the last 72 hours. Thyroid Function Tests Recent Labs    03/28/18  1122  TSH 1.163    Other results:   Imaging    Dg Chest Port 1 View  Result Date: 03/28/2018 CLINICAL DATA:  Volume overload EXAM: PORTABLE CHEST 1 VIEW COMPARISON:  03/20/2018 and priors FINDINGS: Lung volumes are low. The patient is status post CABG. Moderate aortic atherosclerosis at the arch without aneurysm. PICC line tip in the cavoatrial juncture. Chronic elevation of the right hemidiaphragm. No overt pulmonary edema. Atelectasis at the left lung base. Reverse shoulder arthroplasty on the left. IMPRESSION: Low lung volumes with left basilar atelectasis. Aortic atherosclerosis. No active pulmonary disease. Electronically Signed   By: Tollie Ethavid   Kwon M.D.   On: 03/28/2018 19:31     Medications:     Scheduled Medications: . aspirin EC  81 mg Oral Daily  . atorvastatin  40 mg Oral Daily  . cycloSPORINE  1 drop Both Eyes BID  . famotidine  20 mg Oral Daily  . fluticasone  1 spray Each Nare Daily  . insulin aspart  0-15 Units Subcutaneous TID WC  . insulin aspart  0-5 Units Subcutaneous QHS  . pantoprazole  40 mg Oral Daily  . sodium chloride flush  10-40 mL Intracatheter Q12H    Infusions: . sodium chloride    . sodium chloride 10 mL/hr at 03/28/18 1900  . amiodarone 30 mg/hr (03/29/18 1435)  . furosemide Stopped (03/29/18 1122)  . heparin 650 Units/hr (03/29/18 1437)  . milrinone 0.125 mcg/kg/min (03/28/18 1900)  . norepinephrine (LEVOPHED) Adult infusion 16 mcg/min (03/29/18 0935)    PRN Medications: sodium chloride, acetaminophen, albuterol, diazepam, nitroGLYCERIN, ondansetron (ZOFRAN) IV, ondansetron (ZOFRAN) IV, sodium chloride flush    Patient Profile   [name] is a 72 year old with a history of CAD S/P CABG x 3 in 2002 with stent 2003, chronic systolic heart failure, htn, hyperlipidemia, DM, and CKD.   Admitted with chest pain   Assessment/Plan   1. A/C Systolic Heart Failure  - EF 20-25% due to ICM  - Initial RHC with markedly elevated biventricular filling pressures and low cardiac output - Remains extremely tenuous. Todays CO-OX 58% on milrinone 0.25 mcg and NE 16 - Urine output minimal in setting of ATN despite lasix 160 IV tid. CVP 21 - Hold bb for now  - Off entresto and hydralazine/imdur with hypotension.   - No dig with CKD   2. Acute on CKD IV - Creatinine baseline 1.7-1.9  - Today 2.6 -> 3.3. Oliguric.  - Likely due to ATN from surgery and CHF +/- CIN - Continue pressors.  - Has worsening respiratory status and probably early uremia and acidosis. Discussed with Dr. Arlean HoppingSchertz will plan to start CVVHD today. Discussed with patient and family. Will place trialysis catheter today.   3.  PAF - Had episode on 4/6. Remains in NSR on amio.  - Continue heparin. Discussed dosing with PharmD personally.  4. Ischemic RLE - S/p 03/26/2018  Angioplasty and stenting of left common iliac stenosis with VBX stent. Left to right femfemoral bypass - VVS following. Appreciate there assistance.   5. CAD - severe native CAD and graft disease on cath 03/24/18. No revasc options - No s/s angina currently   CRITICAL CARE Performed by: Arvilla MeresBensimhon, Ravinder Lukehart  Total critical care time: 35 minutes  Critical care time was exclusive of separately billable procedures and treating other patients.  Critical care was necessary to treat or prevent imminent or life-threatening deterioration.  Critical care was time spent personally by me (independent of midlevel providers  or residents) on the following activities: development of treatment plan with patient and/or surrogate as well as nursing, discussions with consultants, evaluation of patient's response to treatment, examination of patient, obtaining history from patient or surrogate, ordering and performing treatments and interventions, ordering and review of laboratory studies, ordering and review of radiographic studies, pulse oximetry and re-evaluation of patient's condition.     Length of Stay: 9  Arvilla Meres, MD  03/29/2018, 2:51 PM  Advanced Heart Failure Team Pager [phone] (M-F; 7a - 4p)  Please contact [facility] for night-coverage after hours (4p -7a ) and weekends on [email or web address]

## 2018-03-30 ENCOUNTER — Other Ambulatory Visit: Payer: Self-pay | Admitting: Internal Medicine

## 2018-03-30 LAB — RENAL FUNCTION PANEL
ALBUMIN: 2.5 g/dL — AB (ref 3.5–5.0)
ANION GAP: 12 (ref 5–15)
Albumin: 2.4 g/dL — ABNORMAL LOW (ref 3.5–5.0)
Anion gap: 13 (ref 5–15)
BUN: 30 mg/dL — ABNORMAL HIGH (ref 6–20)
BUN: 17 mg/dL (ref 6–20)
CHLORIDE: 98 mmol/L — AB (ref 101–111)
CO2: 22 mmol/L (ref 22–32)
CO2: 24 mmol/L (ref 22–32)
CREATININE: 2.26 mg/dL — AB (ref 0.44–1.00)
Calcium: 7.9 mg/dL — ABNORMAL LOW (ref 8.9–10.3)
Calcium: 8.2 mg/dL — ABNORMAL LOW (ref 8.9–10.3)
Chloride: 99 mmol/L — ABNORMAL LOW (ref 101–111)
Creatinine, Ser: 1.61 mg/dL — ABNORMAL HIGH (ref 0.44–1.00)
GFR calc Af Amer: 24 mL/min — ABNORMAL LOW (ref >60)
GFR calc non Af Amer: 20 mL/min — ABNORMAL LOW (ref >60)
GFR, EST AFRICAN AMERICAN: 36 mL/min — AB (ref >60)
GFR, EST NON AFRICAN AMERICAN: 31 mL/min — AB (ref >60)
GLUCOSE: 153 mg/dL — AB (ref 65–99)
Glucose, Bld: 178 mg/dL — ABNORMAL HIGH (ref 65–99)
PHOSPHORUS: 2.1 mg/dL — AB (ref 2.5–4.6)
POTASSIUM: 4.5 mmol/L (ref 3.5–5.1)
POTASSIUM: 4.3 mmol/L (ref 3.5–5.1)
Phosphorus: 3.2 mg/dL (ref 2.5–4.6)
Sodium: 133 mmol/L — ABNORMAL LOW (ref 135–145)
Sodium: 135 mmol/L (ref 135–145)

## 2018-03-30 LAB — PREPARE RBC (CROSSMATCH)

## 2018-03-30 LAB — COOXEMETRY PANEL
CARBOXYHEMOGLOBIN: 1.2 % (ref 0.5–1.5)
CARBOXYHEMOGLOBIN: 1.5 % (ref 0.5–1.5)
METHEMOGLOBIN: 1 % (ref 0.0–1.5)
Methemoglobin: 0.7 % (ref 0.0–1.5)
O2 SAT: 43.2 %
O2 Saturation: 65.6 %
Total hemoglobin: 8.6 g/dL — ABNORMAL LOW (ref 12.0–16.0)
Total hemoglobin: 7.6 g/dL — ABNORMAL LOW (ref 12.0–16.0)

## 2018-03-30 LAB — MAGNESIUM: MAGNESIUM: 2.2 mg/dL (ref 1.7–2.4)

## 2018-03-30 LAB — CBC
HCT: 22.7 % — ABNORMAL LOW (ref 36.0–46.0)
HEMOGLOBIN: 7.3 g/dL — AB (ref 12.0–15.0)
MCH: 29.1 pg (ref 26.0–34.0)
MCHC: 32.2 g/dL (ref 30.0–36.0)
MCV: 90.4 fL (ref 78.0–100.0)
PLATELETS: 190 10*3/uL (ref 150–400)
RBC: 2.51 MIL/uL — AB (ref 3.87–5.11)
RDW: 16.6 % — ABNORMAL HIGH (ref 11.5–15.5)
WBC: 9.1 10*3/uL (ref 4.0–10.5)

## 2018-03-30 LAB — HEMOGLOBIN AND HEMATOCRIT, BLOOD
HEMATOCRIT: 30.6 % — AB (ref 36.0–46.0)
HEMOGLOBIN: 10 g/dL — AB (ref 12.0–15.0)

## 2018-03-30 LAB — GLUCOSE, CAPILLARY
GLUCOSE-CAPILLARY: 168 mg/dL — AB (ref 65–99)
Glucose-Capillary: 215 mg/dL — ABNORMAL HIGH (ref 65–99)
Glucose-Capillary: 189 mg/dL — ABNORMAL HIGH (ref 65–99)
Glucose-Capillary: 211 mg/dL — ABNORMAL HIGH (ref 65–99)

## 2018-03-30 LAB — HEPARIN LEVEL (UNFRACTIONATED)
HEPARIN UNFRACTIONATED: 0.15 [IU]/mL — AB (ref 0.30–0.70)
Heparin Unfractionated: 0.29 IU/mL — ABNORMAL LOW (ref 0.30–0.70)

## 2018-03-30 LAB — APTT: aPTT: 69 seconds — ABNORMAL HIGH (ref 24–36)

## 2018-03-30 MED ORDER — AMIODARONE LOAD VIA INFUSION
150.0000 mg | Freq: Once | INTRAVENOUS | Status: AC
  Administered 2018-03-30: 150 mg via INTRAVENOUS
  Filled 2018-03-30: qty 83.34

## 2018-03-30 MED ORDER — SODIUM CHLORIDE 0.9 % IV SOLN: Freq: Once | INTRAVENOUS | Status: AC

## 2018-03-30 MED ORDER — AMIODARONE HCL IN DEXTROSE 360-4.14 MG/200ML-% IV SOLN
60.0000 mg/h | INTRAVENOUS | Status: DC
  Administered 2018-03-31 – 2018-04-03 (×13): 60 mg/h via INTRAVENOUS
  Administered 2018-04-04: 30 mg/h via INTRAVENOUS
  Administered 2018-04-04 (×2): 60 mg/h via INTRAVENOUS
  Administered 2018-04-04: 30 mg/h via INTRAVENOUS
  Administered 2018-04-05: 60 mg/h via INTRAVENOUS
  Administered 2018-04-05: 30 mg/h via INTRAVENOUS
  Administered 2018-04-05 – 2018-04-06 (×2): 60 mg/h via INTRAVENOUS
  Filled 2018-03-30 (×23): qty 200

## 2018-03-30 MED ORDER — AMIODARONE HCL IN DEXTROSE 360-4.14 MG/200ML-% IV SOLN
60.0000 mg/h | INTRAVENOUS | Status: AC
  Administered 2018-03-30 – 2018-03-31 (×5): 60 mg/h via INTRAVENOUS
  Filled 2018-03-30 (×4): qty 200

## 2018-03-30 MED ORDER — AMIODARONE HCL IN DEXTROSE 360-4.14 MG/200ML-% IV SOLN: 30.0000 mg/h | INTRAVENOUS | Status: DC

## 2018-03-30 NOTE — Progress Notes (Signed)
ANTICOAGULATION CONSULT NOTE  Pharmacy Consult for Heparin  Indication: atrial fibrillation  Allergies  Allergen Reactions  . Codeine Shortness Of Breath  . Coreg [Carvedilol] Shortness Of Breath and Cough  . Metoprolol Nausea And Vomiting    "Throws up white, foamy liquid"  . Percocet [Oxycodone-Acetaminophen] Nausea And Vomiting  . Hydrocodone Cough    This was in a cough syrup that made her cough WORSE  . Benazepril Cough    Patient Measurements: Height: 4\' 9"  (144.8 cm) Weight: 161 lb 9.6 oz (73.3 kg) IBW/kg (Calculated) : 38.6 Heparin dose wt 60kg  Vital Signs: Temp: 98.6 F (37 C) (04/08 1437) Temp Source: Axillary (04/08 1437) BP: 106/51 (04/08 1500) Pulse Rate: 85 (04/08 1500)  Labs: Recent Labs    03/28/18 0404  03/29/18 0428 03/29/18 1709 03/30/18 0434 03/30/18 0435 03/30/18 1433  HGB 7.9*  --  7.8*  --  7.3*  --   --   HCT 24.7*  --  23.6*  --  22.7*  --   --   PLT 165  --  159  --  190  --   --   APTT  --   --   --   --  69*  --   --   HEPARINUNFRC  --    < > 0.52  --   --  0.15* 0.29*  CREATININE 2.63*  --  3.34* 3.75*  --  2.26*  --    < > = values in this interval not displayed.    Assessment: 71yof admitted with HF on milrinone 0.18725mcg/kg/min and NE 16 mcg/min for BP support.  S/p fem pop bypass asa and statin.  Heparin drip started for anticoagulation. Heparin level slightly subtherapeutic at 0.29 (goal 0.3-0.7). Hemoglobin trending down to 7.3, platelets up to 190. No concerns with heparin drip or bleeding at this time per nurse, she did note that the heparin level was drawn while PRBC infusing this afternoon. This could mean the level is falsely low, but will continue with plan below. Also noted patient is now requiring CRRT.    Goal of Therapy:  Heparin level 0.3-0.7 units/ml Monitor platelets by anticoagulation protocol: Yes   Plan:  Increase heparin drip to 850 units/hr Check 8 hour level F/u plans for longterm  anticoagulation Monitor for S/Sx for bleeding  Blake DivineShannon Ariella Voit, Pharm.D. PGY1 Pharmacy Resident 03/30/2018 3:52 PM Main Pharmacy: [phone]

## 2018-03-30 NOTE — Plan of Care (Addendum)
Pt currently on CRRT. Oliguric throughout first shift. Bladder scan <30 ml.  Will continue to monitor

## 2018-03-30 NOTE — Progress Notes (Signed)
Pt went into AFIB RVR while eating breakfast. Hailey GlennMichael TIllery PA on unit. Order given for Amio bolus. Will continue to monitor

## 2018-03-30 NOTE — Plan of Care (Signed)
  Problem: Education: Goal: Knowledge of General Education information will improve Outcome: Progressing   Problem: Health Behavior/Discharge Planning: Goal: Ability to manage health-related needs will improve Outcome: Progressing   Problem: Clinical Measurements: Goal: Ability to maintain clinical measurements within normal limits will improve Outcome: Progressing Goal: Will remain free from infection Outcome: Progressing Goal: Diagnostic test results will improve Outcome: Progressing Goal: Respiratory complications will improve Outcome: Progressing   Problem: Nutrition: Goal: Adequate nutrition will be maintained Outcome: Progressing   Problem: Coping: Goal: Level of anxiety will decrease Outcome: Progressing   Problem: Elimination: Goal: Will not experience complications related to urinary retention Outcome: Progressing   Problem: Pain Managment: Goal: General experience of comfort will improve Outcome: Progressing   Problem: Safety: Goal: Ability to remain free from injury will improve Outcome: Progressing   Problem: Skin Integrity: Goal: Risk for impaired skin integrity will decrease Outcome: Progressing   Problem: Education: Goal: Ability to demonstrate management of disease process will improve Outcome: Progressing Goal: Ability to verbalize understanding of medication therapies will improve Outcome: Progressing   Problem: Activity: Goal: Capacity to carry out activities will improve Outcome: Progressing

## 2018-03-30 NOTE — Progress Notes (Signed)
ANTICOAGULATION CONSULT NOTE  Pharmacy Consult for Heparin  Indication: atrial fibrillation  Allergies  Allergen Reactions  . Codeine Shortness Of Breath  . Coreg [Carvedilol] Shortness Of Breath and Cough  . Metoprolol Nausea And Vomiting    "Throws up white, foamy liquid"  . Percocet [Oxycodone-Acetaminophen] Nausea And Vomiting  . Hydrocodone Cough    This was in a cough syrup that made her cough WORSE  . Benazepril Cough    Patient Measurements: Height: 4\' 9"  (144.8 cm) Weight: 161 lb 9.6 oz (73.3 kg) IBW/kg (Calculated) : 38.6 Heparin dose wt 60kg  Vital Signs: Temp: 98.2 F (36.8 C) (04/08 0345) Temp Source: Oral (04/08 0345) BP: 124/40 (04/08 0630) Pulse Rate: 87 (04/08 0630)  Labs: Recent Labs    03/28/18 0404 03/28/18 2100 03/29/18 0428 03/29/18 1709 03/30/18 0434 03/30/18 0435  HGB 7.9*  --  7.8*  --  7.3*  --   HCT 24.7*  --  23.6*  --  22.7*  --   PLT 165  --  159  --  190  --   APTT  --   --   --   --  69*  --   HEPARINUNFRC  --  0.37 0.52  --   --  0.15*  CREATININE 2.63*  --  3.34* 3.75*  --  2.26*    Assessment: 71yof admitted with HF on milrinone 0.14325mcg/kg/min and NE 16 mcg/min for BP support.  S/p fem pop bypass asa and statin.  New Afib overnight started on amiodarone drip > now in SR.  Heparin drip started for anticoagulation. Heparin level now subtherapeutic. hgb down to 7.3.   Goal of Therapy:  Heparin level 0.3-0.7 units/ml Monitor platelets by anticoagulation protocol: Yes    Plan:  Increase heparin drip to 750 units/hr Daily HL, CBC  Check 8 hour level F/u plans for longterm anticoagulation   Baldemar FridayMasters, Maresa Morash M 03/30/2018 6:38 AM

## 2018-03-30 NOTE — Progress Notes (Addendum)
[facility]  Subjective  - Good spirits.  Objective (!) 125/48 81 98.2 F (36.8 C) (Oral) 14 99%  Intake/Output Summary (Last 24 hours) at 03/30/2018 0724 Last data filed at 03/30/2018 0700 Gross per 24 hour  Intake 2068.78 ml  Output 2062 ml  Net 6.78 ml   Doppler AT/PT equal B Active range of motion in B feet ankles Groin clean and dry wound vacs in place  Heart SR Lungs non labored breathing  Assessment/Planning: POD # 4 fem-fem by pass  Patent By pass graft with good doppler signals and active range of motion in B LE AKI on CKD on CRRT UO  85 cc last 24 hours Nephrology on Board Heart failure directed by Dr. Marius DitchBensimhon  Hailey Levine 03/30/2018 7:24 AM --  Laboratory Lab Results: Recent Labs    03/29/18 0428 03/30/18 0434  WBC 12.1* 9.1  HGB 7.8* 7.3*  HCT 23.6* 22.7*  PLT 159 190   BMET Recent Labs    03/29/18 1709 03/30/18 0435  NA 129* 133*  K 4.4 4.5  CL 96* 98*  CO2 16* 22  GLUCOSE 203* 153*  BUN 56* 30*  CREATININE 3.75* 2.26*  CALCIUM 8.2* 7.9*    COAG Lab Results  Component Value Date   INR 1.17 03/23/2018   INR 1.09 04/05/2014   No results found for: PTT  I have interviewed the patient and examined the patient. I agree with the findings by the PA. Brisk Doppler flow in both feet.  Both feet are warm and well-perfused. Prevana dressings have a good seal. These can be removed on POD 7 or 6 and I was paged okay on the right there believable  Hailey Carawayhris Shermaine Rivet, MD [phone]

## 2018-03-30 NOTE — Progress Notes (Addendum)
Advanced Heart Failure Rounding Note  PCP-Cardiologist: Lesleigh NoeHenry W Smith III, MD   Subjective:    03/26/2018 S/P Angioplasty and stenting of left common iliac stenosis with VBX stent.Left to right femorofemoral bypass.   Had AF overnight on 4/6. IV amio started. Now back in NSR  CVVHD started last night (03/29/18). Pulling 80-120 cc an hour. Weight reported as down 9 pounds.  Coox 43.2% on milrinone 0.125 mcg/kg/min and NE 20. CVP 21 -> 15   Hgb 8.2 -> 7.9 -> 7.8 -> 7.3.  Feeling general malaise this am. Just fatigued. Denies SOB or CP. On warming blanket.   Objective:   Weight Range: 161 lb 9.6 oz (73.3 kg) Body mass index is 34.97 kg/m.   Vital Signs:   Temp:  [97.4 F (36.3 C)-98.5 F (36.9 C)] 98.2 F (36.8 C) (04/08 0824) Pulse Rate:  [70-93] 81 (04/08 0700) Resp:  [10-29] 19 (04/08 0800) BP: (97-142)/(38-91) 108/46 (04/08 0800) SpO2:  [87 %-100 %] 99 % (04/08 0700) Arterial Line BP: (83-134)/(29-85) 92/85 (04/07 2345) FiO2 (%):  [55 %] 55 % (04/07 1615) Weight:  [161 lb 9.6 oz (73.3 kg)] 161 lb 9.6 oz (73.3 kg) (04/08 0600) Last BM Date: 03/21/18  Weight change: Filed Weights   03/28/18 0458 03/29/18 0600 03/30/18 0600  Weight: 169 lb 5 oz (76.8 kg) 170 lb 10.2 oz (77.4 kg) 161 lb 9.6 oz (73.3 kg)   Intake/Output:   Intake/Output Summary (Last 24 hours) at 03/30/2018 0845 Last data filed at 03/30/2018 0800 Gross per 24 hour  Intake 1832.58 ml  Output 2229 ml  Net -396.42 ml    Physical Exam   General: Elderly appearing. Lying in bed. On warming blanket HEENT: Normal Neck: Supple. JVP to ear. RIJ trialysis cath Carotids 2+ bilat; no bruits. No thyromegaly or nodule noted. Cor: PMI nondisplaced. RRR, +s3 Lungs: Dependent crackles. Abdomen: Soft, non-tender, non-distended, no HSM. No bruits or masses. +BS  Extremities: No cyanosis, clubbing, or rash. 2+ edema. Warm surgical scars ok.  Neuro: Alert & orientedx3, cranial nerves grossly intact. moves all 4  extremities w/o difficulty. No asterixis. Affect very pleasant.   Telemetry   NSR 80-90s, occasional PVCs. Episode of AF overnight Personally reviewed.   EKG    No new tracings.   Labs    CBC Recent Labs    03/29/18 0428 03/30/18 0434  WBC 12.1* 9.1  HGB 7.8* 7.3*  HCT 23.6* 22.7*  MCV 92.5 90.4  PLT 159 190   Basic Metabolic Panel Recent Labs    45/40/9803/07/11 1709 03/30/18 0434 03/30/18 0435  NA 129*  --  133*  K 4.4  --  4.5  CL 96*  --  98*  CO2 16*  --  22  GLUCOSE 203*  --  153*  BUN 56*  --  30*  CREATININE 3.75*  --  2.26*  CALCIUM 8.2*  --  7.9*  MG  --  2.2  --   PHOS 6.4*  --  3.2   Liver Function Tests Recent Labs    03/29/18 1709 03/30/18 0435  ALBUMIN 2.4* 2.4*   No results for input(s): LIPASE, AMYLASE in the last 72 hours. Cardiac Enzymes No results for input(s): CKTOTAL, CKMB, CKMBINDEX, TROPONINI in the last 72 hours.  BNP: BNP (last 3 results) No results for input(s): BNP in the last 8760 hours.  ProBNP (last 3 results) Recent Labs    11/07/17 1214 03/02/18 1724 03/20/18 1534  PROBNP 11,244* 2,115.0* 11,91432,943*  D-Dimer No results for input(s): DDIMER in the last 72 hours. Hemoglobin A1C No results for input(s): HGBA1C in the last 72 hours. Fasting Lipid Panel No results for input(s): CHOL, HDL, LDLCALC, TRIG, CHOLHDL, LDLDIRECT in the last 72 hours. Thyroid Function Tests Recent Labs    03/28/18 1122  TSH 1.163   Other results:  Imaging   Dg Chest Port 1 View  Result Date: 03/29/2018 CLINICAL DATA:  Encounter for central line placement EXAM: PORTABLE CHEST 1 VIEW COMPARISON:  March 28, 2018 FINDINGS: The heart size and mediastinal contours are stable. A right jugular central venous line is identified with distal tip in the superior vena cava right atrial junction. A right subclavian central venous line is unchanged compared prior exam. There is no pneumothorax. There is pulmonary edema. There is no focal pneumonia or pleural  effusion. The visualized skeletal structures are stable. IMPRESSION: Right jugular central venous line with distal tip in the superior vena cava/right atrial junction. There is no pneumothorax. Pulmonary edema. Electronically Signed   By: Sherian Rein M.D.   On: 03/29/2018 16:54    Medications:    Scheduled Medications: . aspirin EC  81 mg Oral Daily  . atorvastatin  40 mg Oral Daily  . cycloSPORINE  1 drop Both Eyes BID  . famotidine  20 mg Oral Daily  . fluticasone  1 spray Each Nare Daily  . insulin aspart  0-15 Units Subcutaneous TID WC  . insulin aspart  0-5 Units Subcutaneous QHS  . mouth rinse  15 mL Mouth Rinse BID  . pantoprazole  40 mg Oral Daily  . sodium chloride flush  10-40 mL Intracatheter Q12H    Infusions: . sodium chloride    . sodium chloride 10 mL/hr at 03/30/18 0800  . amiodarone 30 mg/hr (03/30/18 0800)  . heparin 750 Units/hr (03/30/18 0800)  . milrinone 0.125 mcg/kg/min (03/30/18 0800)  . norepinephrine (LEVOPHED) Adult infusion 20.053 mcg/min (03/30/18 0800)  . dialysis replacement fluid (prismasate) 400 mL/hr at 03/30/18 0655  . dialysis replacement fluid (prismasate) 200 mL/hr at 03/29/18 1729  . dialysate (PRISMASATE) 2,000 mL/hr at 03/30/18 0658  . sodium chloride      PRN Medications: sodium chloride, acetaminophen, albuterol, alteplase, bisacodyl, bisacodyl, diazepam, docusate sodium, heparin, nitroGLYCERIN, ondansetron (ZOFRAN) IV, ondansetron (ZOFRAN) IV, sodium chloride, sodium chloride flush  Patient Profile   Ms Benningfield is a 72 year old with a history of CAD S/P CABG x 3 in 2002 with stent 2003, chronic systolic heart failure, htn, hyperlipidemia, DM, and CKD.   Admitted with chest pain   Assessment/Plan   1. A/C Systolic Heart Failure -> Cardiogenic shock - EF 20-25% due to ICM  - Initial RHC with markedly elevated biventricular filling pressures and low cardiac output - Remains very tenuous - Now on CVVHD - Coox 43.2% this am on  milrinone 0.125 mcg/kg/min and NE 20. Recheck pending. If comes back back low will likely need to increase mirlinone.  - Hold bb for now. - Off entresto and hydralazine/imdur with hypotension/ARF - No dig with CKD   2. 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    3. PAF - Had episode on 4/6. Remains in NSR on amio.  - Continue heparin. Discussed dosing with PharmD personally.   4. Ischemic RLE - S/p 03/26/2018  Angioplasty and stenting of left common iliac stenosis with VBX stent. Left to right femfemoral bypass - [facility] following. Appreciate their assistance.   5. CAD - severe native CAD and graft disease on cath 03/24/18. No revasc options - No s/s of ischemia.     6. Post op anemia - Hgb down to 7.3. Will likely need unit today.  - Spoke with Dr. Allena Katz. He is ordering 2 uPRBCs.  Length of Stay: [address]  [employer]  03/30/2018, 8:45 AM  Advanced Heart Failure Team Pager [phone] (M-F; 7a - 4p)  Please contact [facility] for night-coverage after hours (4p -7a ) and weekends on [email or web address]  Agree.    She is critically ill. Now with multi-system organ failure. Cardiac output very low despite dual inotropes. Now on CVVHD. In/out of AF  Exam  Ill appearing weak CVP to ear RIJ trialysis cath Cor RRR Lungs + crackles Ab soft NT Ext 1-2+ edema  She has end-stage HF now with MSOF. Prognosis appears grim. Will see how she responds to CVVHD and if there is any renal recovery. Continue inotropic support. Continue amio and heparin.   CRITICAL CARE Performed by: Arvilla Meres  Total critical care time: 35 minutes  Critical care time was exclusive of separately billable procedures and treating other patients.  Critical care was necessary to treat or prevent imminent or life-threatening deterioration.  Critical  care was time spent personally by me (independent of midlevel providers or residents) on the following activities: development of treatment plan with patient and/or surrogate as well as nursing, discussions with consultants, evaluation of patient's response to treatment, examination of patient, obtaining history from patient or surrogate, ordering and performing treatments and interventions, ordering and review of laboratory studies, ordering and review of radiographic studies, pulse oximetry and re-evaluation of patient's condition.    Arvilla Meres, MD  9:46 AM

## 2018-03-30 NOTE — Progress Notes (Signed)
Pt back into AFIB RVR. Paged heart failure team and order for additional Amio bolus given. Will continue to monitor

## 2018-03-30 NOTE — Progress Notes (Signed)
Patient ID: Hailey Levine, female   DOB: 05/01/1946, 72 y.o.   MRN: [id number]  [facility] Progress Note    Assessment/ Plan:   1.  Acute kidney injury on chronic kidney disease stage IV: Suspected to be possibly multifactorial from cardiorenal mechanism with renal hypoperfusion (from CHF exacerbation) and possibly some contrast-induced injury.  Anuric overnight with 85 cc urine output charted and will continue CRRT at this time for volume unloading as well as regulation of multiple metabolic abnormalities.  From exam, does not appear to be uremic at this time.  Based on her hemodynamic status- will consider transitioning to intermittent hemodialysis at some point in the next couple of days.  Discontinue Foley catheter at this time and will assess bladder scan each morning. 2.  CHF exacerbation: With history of ischemic cardiomyopathy/systolic heart failure (EF 20-25%) and poor response to intravenous diuretics for which she was started on CRRT for volume unloading. 3.  Cardiogenic shock: Remains on pressors at this time as we attempt volume unloading with CRRT 4.  Right leg ischemia: Postop day #4, status post percutaneous transluminal angioplasty/stenting of left iliac stenosis and left to right femoral-femoral bypass with good Doppler signals and range of motion in both legs noted. 5.  Anemia: Likely secondary to chronic kidney disease and recent surgical losses-will order for PRBC transfusion.  Subjective:   Reports to be feeling fair-denies any chest pain but having some intermittent shortness of breath   Objective:   BP (!) 108/46 (BP Location: Left Arm)   Pulse 81   Temp 98.2 F (36.8 C) (Oral)   Resp 19   Ht 4\' 9"  (1.448 m)   Wt 73.3 kg (161 lb 9.6 oz)   SpO2 99%   BMI 34.97 kg/m   Intake/Output Summary (Last 24 hours) at 03/30/2018 0845 Last data filed at 03/30/2018 0800 Gross per 24 hour  Intake 1832.58 ml  Output 2229 ml  Net -396.42 ml   Weight change:  -4.1 kg (-9 lb 0.6 oz)  Physical Exam: Gen: Comfortably resting in bed, intermittently falling asleep but able to carry out conversation CVS: Pulse regular rhythm, normal rate, S1 and S2 normal Resp: Coarse rales bilaterally, no distinct rhonchi Abd: Soft, obese, nontender Ext: 1+ lower extremity edema  Imaging: Dg Chest Port 1 View  Result Date: 03/29/2018 CLINICAL DATA:  Encounter for central line placement EXAM: PORTABLE CHEST 1 VIEW COMPARISON:  March 28, 2018 FINDINGS: The heart size and mediastinal contours are stable. A right jugular central venous line is identified with distal tip in the superior vena cava right atrial junction. A right subclavian central venous line is unchanged compared prior exam. There is no pneumothorax. There is pulmonary edema. There is no focal pneumonia or pleural effusion. The visualized skeletal structures are stable. IMPRESSION: Right jugular central venous line with distal tip in the superior vena cava/right atrial junction. There is no pneumothorax. Pulmonary edema. Electronically Signed   By: Sherian ReinWei-Chen  Lin M.D.   On: 03/29/2018 16:54   Dg Chest Port 1 View  Result Date: 03/28/2018 CLINICAL DATA:  Volume overload EXAM: PORTABLE CHEST 1 VIEW COMPARISON:  03/20/2018 and priors FINDINGS: Lung volumes are low. The patient is status post CABG. Moderate aortic atherosclerosis at the arch without aneurysm. PICC line tip in the cavoatrial juncture. Chronic elevation of the right hemidiaphragm. No overt pulmonary edema. Atelectasis at the left lung base. Reverse shoulder arthroplasty on the left. IMPRESSION: Low lung volumes with left basilar atelectasis. Aortic atherosclerosis. No  active pulmonary disease. Electronically Signed   By: Tollie Eth M.D.   On: 03/28/2018 19:31    Labs: BMET Recent Labs  Lab 03/27/18 0400 03/27/18 1616 03/27/18 1700 03/27/18 1708 03/28/18 0404 03/29/18 0428 03/29/18 1709 03/30/18 0435  NA 139 142 138 138 134* 132* 129* 133*  K  3.8 2.1* 3.8 3.8 4.2 4.3 4.4 4.5  CL 103 121* 103 103 100* 96* 96* 98*  CO2 23 13* 22  --  20* 19* 16* 22  GLUCOSE 172* 139* 236* 234* 263* 209* 203* 153*  BUN 42* 29* 46* 41* 47* 52* 56* 30*  CREATININE 2.14* 1.40* 2.51* 2.40* 2.63* 3.34* 3.75* 2.26*  CALCIUM 8.4* 4.8* 8.2*  --  8.2* 8.4* 8.2* 7.9*  PHOS  --   --   --   --   --   --  6.4* 3.2   CBC Recent Labs  Lab 03/27/18 0400 03/27/18 1708 03/28/18 0404 03/29/18 0428 03/30/18 0434  WBC 9.6  --  11.4* 12.1* 9.1  HGB 8.5* 8.2* 7.9* 7.8* 7.3*  HCT 26.5* 24.0* 24.7* 23.6* 22.7*  MCV 95.3  --  95.0 92.5 90.4  PLT 145*  --  165 159 190    Medications:    . aspirin EC  81 mg Oral Daily  . atorvastatin  40 mg Oral Daily  . cycloSPORINE  1 drop Both Eyes BID  . famotidine  20 mg Oral Daily  . fluticasone  1 spray Each Nare Daily  . insulin aspart  0-15 Units Subcutaneous TID WC  . insulin aspart  0-5 Units Subcutaneous QHS  . mouth rinse  15 mL Mouth Rinse BID  . pantoprazole  40 mg Oral Daily  . sodium chloride flush  10-40 mL Intracatheter Q12H   Zetta Bills, MD 03/30/2018, 8:45 AM

## 2018-03-31 LAB — RENAL FUNCTION PANEL
ALBUMIN: 2.4 g/dL — AB (ref 3.5–5.0)
ANION GAP: 13 (ref 5–15)
Albumin: 2.5 g/dL — ABNORMAL LOW (ref 3.5–5.0)
Anion gap: 12 (ref 5–15)
BUN: 13 mg/dL (ref 6–20)
BUN: 9 mg/dL (ref 6–20)
CALCIUM: 8.1 mg/dL — AB (ref 8.9–10.3)
CHLORIDE: 98 mmol/L — AB (ref 101–111)
CO2: 23 mmol/L (ref 22–32)
CO2: 24 mmol/L (ref 22–32)
CREATININE: 1.41 mg/dL — AB (ref 0.44–1.00)
Calcium: 8.1 mg/dL — ABNORMAL LOW (ref 8.9–10.3)
Chloride: 100 mmol/L — ABNORMAL LOW (ref 101–111)
Creatinine, Ser: 1.21 mg/dL — ABNORMAL HIGH (ref 0.44–1.00)
GFR calc Af Amer: 42 mL/min — ABNORMAL LOW (ref >60)
GFR calc Af Amer: 50 mL/min — ABNORMAL LOW (ref >60)
GFR, EST NON AFRICAN AMERICAN: 36 mL/min — AB (ref >60)
GFR, EST NON AFRICAN AMERICAN: 43 mL/min — AB (ref >60)
Glucose, Bld: 169 mg/dL — ABNORMAL HIGH (ref 65–99)
Glucose, Bld: 183 mg/dL — ABNORMAL HIGH (ref 65–99)
PHOSPHORUS: 1.7 mg/dL — AB (ref 2.5–4.6)
Phosphorus: 2 mg/dL — ABNORMAL LOW (ref 2.5–4.6)
Potassium: 4.2 mmol/L (ref 3.5–5.1)
Potassium: 4.4 mmol/L (ref 3.5–5.1)
Sodium: 133 mmol/L — ABNORMAL LOW (ref 135–145)
Sodium: 137 mmol/L (ref 135–145)

## 2018-03-31 LAB — COOXEMETRY PANEL
Carboxyhemoglobin: 1.6 % — ABNORMAL HIGH (ref 0.5–1.5)
METHEMOGLOBIN: 0.8 % (ref 0.0–1.5)
O2 Saturation: 63.6 %
TOTAL HEMOGLOBIN: 11.6 g/dL — AB (ref 12.0–16.0)

## 2018-03-31 LAB — TYPE AND SCREEN
ABO/RH(D): O NEG
Antibody Screen: NEGATIVE
UNIT DIVISION: 0
Unit division: 0

## 2018-03-31 LAB — CBC
HCT: 31 % — ABNORMAL LOW (ref 36.0–46.0)
HEMOGLOBIN: 10.1 g/dL — AB (ref 12.0–15.0)
MCH: 29.4 pg (ref 26.0–34.0)
MCHC: 32.6 g/dL (ref 30.0–36.0)
MCV: 90.1 fL (ref 78.0–100.0)
Platelets: 171 10*3/uL (ref 150–400)
RBC: 3.44 MIL/uL — AB (ref 3.87–5.11)
RDW: 16.9 % — ABNORMAL HIGH (ref 11.5–15.5)
WBC: 9.2 10*3/uL (ref 4.0–10.5)

## 2018-03-31 LAB — BPAM RBC
BLOOD PRODUCT EXPIRATION DATE: 201904082359
Blood Product Expiration Date: 201904202359
ISSUE DATE / TIME: 201904081102
ISSUE DATE / TIME: 201904081102
UNIT TYPE AND RH: 9500
UNIT TYPE AND RH: 9500

## 2018-03-31 LAB — HEPARIN LEVEL (UNFRACTIONATED)
HEPARIN UNFRACTIONATED: 0.3 [IU]/mL (ref 0.30–0.70)
Heparin Unfractionated: 0.4 IU/mL (ref 0.30–0.70)

## 2018-03-31 LAB — GLUCOSE, CAPILLARY
GLUCOSE-CAPILLARY: 165 mg/dL — AB (ref 65–99)
GLUCOSE-CAPILLARY: 135 mg/dL — AB (ref 65–99)
GLUCOSE-CAPILLARY: 154 mg/dL — AB (ref 65–99)
Glucose-Capillary: 119 mg/dL — ABNORMAL HIGH (ref 65–99)

## 2018-03-31 LAB — APTT: aPTT: 79 seconds — ABNORMAL HIGH (ref 24–36)

## 2018-03-31 LAB — MAGNESIUM: MAGNESIUM: 2.4 mg/dL (ref 1.7–2.4)

## 2018-03-31 MED ORDER — ALTEPLASE 2 MG IJ SOLR
2.0000 mg | Freq: Once | INTRAMUSCULAR | Status: AC
  Administered 2018-03-31: 2 mg

## 2018-03-31 MED ORDER — ALTEPLASE 2 MG IJ SOLR
2.0000 mg | Freq: Once | INTRAMUSCULAR | Status: AC
  Filled 2018-03-31: qty 2

## 2018-03-31 MED ORDER — GUAIFENESIN-DM 100-10 MG/5ML PO SYRP
15.0000 mL | ORAL_SOLUTION | ORAL | Status: DC | PRN
  Administered 2018-03-31 – 2018-04-06 (×3): 15 mL via ORAL
  Filled 2018-03-31 (×4): qty 15

## 2018-03-31 MED ORDER — LORAZEPAM 2 MG/ML IJ SOLN
2.0000 mg | Freq: Once | INTRAMUSCULAR | Status: AC
  Administered 2018-03-31: 2 mg via INTRAVENOUS
  Filled 2018-03-31: qty 1

## 2018-03-31 MED ORDER — SODIUM CHLORIDE 0.9% FLUSH
10.0000 mL | Freq: Two times a day (BID) | INTRAVENOUS | Status: DC
  Administered 2018-04-01 – 2018-04-05 (×8): 10 mL

## 2018-03-31 MED ORDER — SODIUM CHLORIDE 0.9% FLUSH: 10.0000 mL | INTRAVENOUS | Status: DC | PRN

## 2018-03-31 MED ORDER — AMIODARONE IV BOLUS ONLY 150 MG/100ML
150.0000 mg | Freq: Once | INTRAVENOUS | Status: AC
  Administered 2018-03-31: 150 mg via INTRAVENOUS

## 2018-03-31 MED ORDER — CHLORHEXIDINE GLUCONATE CLOTH 2 % EX PADS
6.0000 | MEDICATED_PAD | Freq: Every day | CUTANEOUS | Status: DC
  Administered 2018-03-31 – 2018-04-06 (×7): 6 via TOPICAL

## 2018-03-31 NOTE — Progress Notes (Addendum)
[id number]: RN into room to change PICC dressing per protocol. However pt became increasingly confused and agitated, adamantly refusing dressing change and began trying to get OOB without assistance. RN attempted redirection multiple times with different nurses, but pt remains confused and agitated. Pt is alert and oriented to self but disoriented to place/time/situation (pt has been variably oriented per previous assessments); pulling at lines and dressings, removing oxygen. Pt expresses paranoid thoughts and at times seems to be hallucinating.   [id number]: Voicemail left for pt brother-in-law Wendie Agreste(James Scoggins), unable to reach anyone else via the alternate contact number listed.   0500: Cards on-call paged x3 to address the issue.   [id number]: Cards, Dr. Mayford Knifeurner, called back and asked RN to contact [facility]. Dr. Darrick PennaFields paged and awaiting call back.   0530: Dr. Darrick PennaFields paged again. Orders received, will implement and continue to monitor.

## 2018-03-31 NOTE — Progress Notes (Signed)
Pt back in afib with RVR 110s-140s. Dr. Gala RomneyBensimhon rounding and gave orders for 150mg  amio bolus.

## 2018-03-31 NOTE — Progress Notes (Signed)
Patient ID: Hailey Levine, female   DOB: 24-May-1946, 72 y.o.   MRN: [id number] [facility] Progress Note   Assessment/ Plan:   1.  Acute kidney injury on chronic kidney disease stage IV: Suspected to be possibly multifactorial from cardiorenal mechanism with renal hypoperfusion (from CHF exacerbation) and possibly some contrast-induced injury.  Remains anuric (Foley catheter discontinued because of comfort/poor urine output/infection control).  Continue current prescription of CRRT as her CVP remains elevated at about 18.  Overnight net -2.9 L. 2.  CHF exacerbation: With history of ischemic cardiomyopathy/systolic heart failure (EF 20-25%) and started on CRRT for volume unloading after resistant to diuretics. 3.  Cardiogenic shock: Remains on pressors at this time as we attempt volume unloading with CRRT 4.  Right leg ischemia: Postop day #5, status post percutaneous transluminal angioplasty/stenting of left iliac stenosis and left to right femoral-femoral bypass with good Doppler signals and range of motion in both legs noted. 5.  Anemia: Likely secondary to chronic kidney disease and recent surgical losses-improved status post PRBC transfusion.  Subjective:   Confused/agitated overnight with insomnia-currently sleeping comfortably.  Tolerating CRRT without problems.   Objective:   BP 97/70   Pulse (!) 138   Temp 98.5 F (36.9 C) (Axillary)   Resp 17   Ht 4\' 9"  (1.448 m)   Wt 71.9 kg (158 lb 8.2 oz)   SpO2 97%   BMI 34.30 kg/m   Intake/Output Summary (Last 24 hours) at 03/31/2018 0743 Last data filed at 03/31/2018 0700 Gross per 24 hour  Intake 2753.49 ml  Output 5717 ml  Net -2963.51 ml   Weight change: -1.4 kg (-3 lb 1.4 oz)  Physical Exam: Gen: Comfortably sleeping in bed. CVS: Pulse regular rhythm, normal rate, S1 and S2 normal Resp: Coarse breath sounds bilaterally, no distinct rhonchi Abd: Soft, obese, nontender Ext: 1+-2+ lower extremity  edema  Imaging: Dg Chest Port 1 View  Result Date: 03/29/2018 CLINICAL DATA:  Encounter for central line placement EXAM: PORTABLE CHEST 1 VIEW COMPARISON:  March 28, 2018 FINDINGS: The heart size and mediastinal contours are stable. A right jugular central venous line is identified with distal tip in the superior vena cava right atrial junction. A right subclavian central venous line is unchanged compared prior exam. There is no pneumothorax. There is pulmonary edema. There is no focal pneumonia or pleural effusion. The visualized skeletal structures are stable. IMPRESSION: Right jugular central venous line with distal tip in the superior vena cava/right atrial junction. There is no pneumothorax. Pulmonary edema. Electronically Signed   By: Sherian Rein M.D.   On: 03/29/2018 16:54    Labs: BMET Recent Labs  Lab 03/27/18 1700 03/27/18 1708 03/28/18 0404 03/29/18 0428 03/29/18 1709 03/30/18 0435 03/30/18 1935 03/31/18 0419  NA 138 138 134* 132* 129* 133* 135 133*  K 3.8 3.8 4.2 4.3 4.4 4.5 4.3 4.2  CL 103 103 100* 96* 96* 98* 99* 98*  CO2 22  --  20* 19* 16* 22 24 23   GLUCOSE 236* 234* 263* 209* 203* 153* 178* 169*  BUN 46* 41* 47* 52* 56* 30* 17 13  CREATININE 2.51* 2.40* 2.63* 3.34* 3.75* 2.26* 1.61* 1.41*  CALCIUM 8.2*  --  8.2* 8.4* 8.2* 7.9* 8.2* 8.1*  PHOS  --   --   --   --  6.4* 3.2 2.1* 2.0*   CBC Recent Labs  Lab 03/28/18 0404 03/29/18 0428 03/30/18 0434 03/30/18 1935 03/31/18 0419  WBC 11.4* 12.1* 9.1  --  9.2  HGB 7.9* 7.8* 7.3* 10.0* 10.1*  HCT 24.7* 23.6* 22.7* 30.6* 31.0*  MCV 95.0 92.5 90.4  --  90.1  PLT 165 159 190  --  171    Medications:    . aspirin EC  81 mg Oral Daily  . atorvastatin  40 mg Oral Daily  . cycloSPORINE  1 drop Both Eyes BID  . famotidine  20 mg Oral Daily  . fluticasone  1 spray Each Nare Daily  . insulin aspart  0-15 Units Subcutaneous TID WC  . insulin aspart  0-5 Units Subcutaneous QHS  . mouth rinse  15 mL Mouth Rinse BID   . pantoprazole  40 mg Oral Daily  . sodium chloride flush  10-40 mL Intracatheter Q12H   Zetta BillsJay Bracen Schum, MD 03/31/2018, 7:43 AM

## 2018-03-31 NOTE — Progress Notes (Signed)
ANTICOAGULATION CONSULT NOTE - Follow Up Consult  Pharmacy Consult for heparin Indication: atrial fibrillation  Labs: Recent Labs    03/28/18 0404  03/29/18 0428 03/29/18 1709 03/30/18 0434 03/30/18 0435 03/30/18 1433 03/30/18 1935 03/30/18 2338  HGB 7.9*  --  7.8*  --  7.3*  --   --  10.0*  --   HCT 24.7*  --  23.6*  --  22.7*  --   --  30.6*  --   PLT 165  --  159  --  190  --   --   --   --   APTT  --   --   --   --  69*  --   --   --   --   HEPARINUNFRC  --    < > 0.52  --   --  0.15* 0.29*  --  0.40  CREATININE 2.63*  --  3.34* 3.75*  --  2.26*  --  1.61*  --    < > = values in this interval not displayed.    Assessment/Plan:  72yo female therapeutic on heparin after rate changes. Will continue gtt at current rate and confirm stable with am labs.   Vernard GamblesVeronda Caylie Sandquist, PharmD, BCPS  03/31/2018,12:24 AM

## 2018-03-31 NOTE — Plan of Care (Signed)
Problem: Education: Goal: Knowledge of General Education information will improve Outcome: Progressing   Problem: Health Behavior/Discharge Planning: Goal: Ability to manage health-related needs will improve Outcome: Progressing   Problem: Clinical Measurements: Goal: Ability to maintain clinical measurements within normal limits will improve Outcome: Progressing Goal: Will remain free from infection Outcome: Progressing Goal: Diagnostic test results will improve Outcome: Progressing Goal: Respiratory complications will improve Outcome: Progressing   Problem: Nutrition: Goal: Adequate nutrition will be maintained Outcome: Progressing   Problem: Elimination: Goal: Will not experience complications related to bowel motility Outcome: Progressing   Problem: Pain Managment: Goal: General experience of comfort will improve Outcome: Progressing Note:  Denies any pain.   Problem: Safety: Goal: Ability to remain free from injury will improve Outcome: Progressing   Problem: Skin Integrity: Goal: Risk for impaired skin integrity will decrease Outcome: Progressing   Problem: Education: Goal: Ability to demonstrate management of disease process will improve Outcome: Progressing Goal: Ability to verbalize understanding of medication therapies will improve Outcome: Progressing   Problem: Cardiac: Goal: Ability to achieve and maintain adequate cardiopulmonary perfusion will improve Outcome: Progressing  Problem: Coping: Goal: Level of anxiety will decrease Outcome: Not Progressing Note:  Pt remains anxious and confused at times. Able to be reoriented with some effort. Declines PRN medication at this time.    Problem: Elimination: Goal: Will not experience complications related to urinary retention Outcome: Not Progressing Note:  Pt continues to be anuric on CRRT.   Problem: Activity: Goal: Capacity to carry out activities will improve Outcome: Not Progressing Note:   Remains on bedrest while on CRRT.

## 2018-03-31 NOTE — Progress Notes (Signed)
ANTICOAGULATION CONSULT NOTE  Pharmacy Consult for Heparin  Indication: atrial fibrillation  Allergies  Allergen Reactions  . Codeine Shortness Of Breath  . Coreg [Carvedilol] Shortness Of Breath and Cough  . Metoprolol Nausea And Vomiting    "Throws up white, foamy liquid"  . Percocet [Oxycodone-Acetaminophen] Nausea And Vomiting  . Hydrocodone Cough    This was in a cough syrup that made her cough WORSE  . Benazepril Cough    Patient Measurements: Height: 4\' 9"  (144.8 cm) Weight: 158 lb 8.2 oz (71.9 kg) IBW/kg (Calculated) : 38.6 Heparin dose wt 60kg  Vital Signs: Temp: 96.5 F (35.8 C) (04/09 0754) Temp Source: Axillary (04/09 0754) BP: 110/62 (04/09 0900) Pulse Rate: 132 (04/09 0900)  Labs: Recent Labs    03/29/18 0428  03/30/18 0434 03/30/18 0435 03/30/18 1433 03/30/18 1935 03/30/18 2338 03/31/18 0419  HGB 7.8*  --  7.3*  --   --  10.0*  --  10.1*  HCT 23.6*  --  22.7*  --   --  30.6*  --  31.0*  PLT 159  --  190  --   --   --   --  171  APTT  --   --  69*  --   --   --   --  79*  HEPARINUNFRC 0.52  --   --  0.15* 0.29*  --  0.40 0.30  CREATININE 3.34*   < >  --  2.26*  --  1.61*  --  1.41*   < > = values in this interval not displayed.    Assessment: 71yof admitted with HF, S/p fem pop bypass.  Heparin drip started for anticoagulation. Heparin level at low end of goal at 0.30 this morning. Hemoglobin stable at 10.1 and platelets stable within normal limits. Patient continues on pressors and CRRT this morning.    Goal of Therapy:  Heparin level 0.3-0.7 units/ml Monitor platelets by anticoagulation protocol: Yes   Plan:  Increase heparin to 900 units/ hour Daily HL/ CBC  F/u plans for longterm anticoagulation Monitor for S/Sx for bleeding  Blake DivineShannon Abdulraheem Pineo, Pharm.D. PGY1 Pharmacy Resident 03/31/2018 9:11 AM Main Pharmacy: [phone]

## 2018-03-31 NOTE — Progress Notes (Addendum)
[facility] [place]  Subjective  - Confused over night, sleeping well this am.   Objective 97/70 (!) 138 98.5 F (36.9 C) (Axillary) 17 97%  Intake/Output Summary (Last 24 hours) at 03/31/2018 0729 Last data filed at 03/31/2018 0700 Gross per 24 hour  Intake 2753.49 ml  Output 5717 ml  Net -2963.51 ml    Doppler AT/PT brisk, moves feet spontaneously  Groin incisional vacs in place Heart A fib since last night Lungs non labored breathing  Assessment/Planning: POD # 5 fem-fem by pass  Patent by pass good doppler signals again this am Continuous CRRT New on set of A fib Appears comfortable this am sleeping     Hailey Levine 03/31/2018 7:29 AM --  Laboratory Lab Results: Recent Labs    03/30/18 0434 03/30/18 1935 03/31/18 0419  WBC 9.1  --  9.2  HGB 7.3* 10.0* 10.1*  HCT 22.7* 30.6* 31.0*  PLT 190  --  171   BMET Recent Labs    03/30/18 1935 03/31/18 0419  NA 135 133*  K 4.3 4.2  CL 99* 98*  CO2 24 23  GLUCOSE 178* 169*  BUN 17 13  CREATININE 1.61* 1.41*  CALCIUM 8.2* 8.1*    COAG Lab Results  Component Value Date   INR 1.17 03/23/2018   INR 1.09 04/05/2014   No results found for: PTT     I have interviewed the patient and examined the patient. I agree with the findings by the PA. Remove Prevana dressings on Thursday.   Cari Carawayhris Bowie Delia, MD [phone]

## 2018-03-31 NOTE — Plan of Care (Signed)
  Problem: Cardiac: Goal: Ability to achieve and maintain adequate cardiopulmonary perfusion will improve Outcome: Progressing Note:  Bp stable, pt on 4l [place] sats >95 %   Problem: Activity: Goal: Capacity to carry out activities will improve Outcome: Not Progressing Note:  Pt on CRRT machine HR increases 130's at times

## 2018-03-31 NOTE — Progress Notes (Signed)
Advanced Heart Failure Rounding Note  PCP-Cardiologist: Hailey NoeHenry W Smith III, MD   Subjective:    03/26/2018 S/P Angioplasty and stenting of left common iliac stenosis with VBX stent.Left to right femorofemoral bypass.   Had AF overnight on 4/6. IV amio started.   CVVHD started 03/29/18. Pulling 125 cc an hour. Weight down 19 pounds total. Now anuric   Agitated and confused early this am. Received ativan and now sedated   Coox 43.2%-> 63.6% on milrinone 0.125 mcg/kg/min and NE 14. CVP 21 -> 15 -> 12-14       Objective:   Weight Range: 71.9 kg (158 lb 8.2 oz) Body mass index is 34.3 kg/m.   Vital Signs:   Temp:  [97.3 F (36.3 C)-98.8 F (37.1 C)] 98.5 F (36.9 C) (04/09 0412) Pulse Rate:  [78-129] 90 (04/09 0500) Resp:  [12-24] 15 (04/09 0600) BP: (71-149)/(40-87) 115/48 (04/09 0600) SpO2:  [90 %-100 %] 95 % (04/09 0500) Weight:  [71.9 kg (158 lb 8.2 oz)] 71.9 kg (158 lb 8.2 oz) (04/09 0400) Last BM Date: 03/21/18  Weight change: Filed Weights   03/29/18 0600 03/30/18 0600 03/31/18 0400  Weight: 77.4 kg (170 lb 10.2 oz) 73.3 kg (161 lb 9.6 oz) 71.9 kg (158 lb 8.2 oz)   Intake/Output:   Intake/Output Summary (Last 24 hours) at 03/31/2018 0621 Last data filed at 03/31/2018 0600 Gross per 24 hour  Intake 2650.76 ml  Output 5691 ml  Net -3040.24 ml    Physical Exam   General:  Ill appearing sedated. Mouth breathing HEENT: normal Neck: supple. RIJ trialysis. Carotids 2+ bilat; no bruits. No lymphadenopathy or thryomegaly appreciated. Cor: PMI nondisplaced. IRR tachy Lungs: clear anteriorly  Abdomen: soft, nontender, nondistended. No hepatosplenomegaly. No bruits or masses. Good bowel sounds. Extremities: no cyanosis, clubbing, rash, 1+ edema + bilateral wound vacs Neuro: sedated    Telemetry   NSR 80-90s -> AF 100-110 Personally reviewed.   EKG    No new tracings.   Labs    CBC Recent Labs    03/30/18 0434 03/30/18 1935 03/31/18 0419  WBC 9.1  --   9.2  HGB 7.3* 10.0* 10.1*  HCT 22.7* 30.6* 31.0*  MCV 90.4  --  90.1  PLT 190  --  171   Basic Metabolic Panel Recent Labs    96/03/5403/08/19 0434  03/30/18 1935 03/31/18 0419  NA  --    < > 135 133*  K  --    < > 4.3 4.2  CL  --    < > 99* 98*  CO2  --    < > 24 23  GLUCOSE  --    < > 178* 169*  BUN  --    < > 17 13  CREATININE  --    < > 1.61* 1.41*  CALCIUM  --    < > 8.2* 8.1*  MG 2.2  --   --  2.4  PHOS  --    < > 2.1* 2.0*   < > = values in this interval not displayed.   Liver Function Tests Recent Labs    03/30/18 1935 03/31/18 0419  ALBUMIN 2.5* 2.5*   No results for input(s): LIPASE, AMYLASE in the last 72 hours. Cardiac Enzymes No results for input(s): CKTOTAL, CKMB, CKMBINDEX, TROPONINI in the last 72 hours.  BNP: BNP (last 3 results) No results for input(s): BNP in the last 8760 hours.  ProBNP (last 3 results) Recent Labs  11/07/17 1214 03/02/18 1724 03/20/18 1534  PROBNP 11,244* 2,115.0* 16,109*   D-Dimer No results for input(s): DDIMER in the last 72 hours. Hemoglobin A1C No results for input(s): HGBA1C in the last 72 hours. Fasting Lipid Panel No results for input(s): CHOL, HDL, LDLCALC, TRIG, CHOLHDL, LDLDIRECT in the last 72 hours. Thyroid Function Tests Recent Labs    03/28/18 1122  TSH 1.163   Other results:  Imaging   No results found.  Medications:    Scheduled Medications: . aspirin EC  81 mg Oral Daily  . atorvastatin  40 mg Oral Daily  . cycloSPORINE  1 drop Both Eyes BID  . famotidine  20 mg Oral Daily  . fluticasone  1 spray Each Nare Daily  . insulin aspart  0-15 Units Subcutaneous TID WC  . insulin aspart  0-5 Units Subcutaneous QHS  . mouth rinse  15 mL Mouth Rinse BID  . pantoprazole  40 mg Oral Daily  . sodium chloride flush  10-40 mL Intracatheter Q12H    Infusions: . sodium chloride    . sodium chloride 10 mL/hr at 03/31/18 0000  . amiodarone 60 mg/hr (03/31/18 0505)  . amiodarone    . amiodarone 150 mg  (03/31/18 6045)  . heparin 850 Units/hr (03/31/18 0000)  . milrinone 0.125 mcg/kg/min (03/31/18 0000)  . norepinephrine (LEVOPHED) Adult infusion 14 mcg/min (03/31/18 0000)  . dialysis replacement fluid (prismasate) 400 mL/hr at 03/30/18 1954  . dialysis replacement fluid (prismasate) 200 mL/hr at 03/30/18 1951  . dialysate (PRISMASATE) 2,000 mL/hr at 03/31/18 0611  . sodium chloride      PRN Medications: sodium chloride, acetaminophen, albuterol, alteplase, bisacodyl, bisacodyl, diazepam, docusate sodium, guaiFENesin-dextromethorphan, heparin, nitroGLYCERIN, ondansetron (ZOFRAN) IV, ondansetron (ZOFRAN) IV, sodium chloride, sodium chloride flush  Patient Profile   Hailey Levine is a 72 year old with a history of CAD S/P CABG x 3 in 2002 with stent 2003, chronic systolic heart failure, htn, hyperlipidemia, DM, and CKD.   Admitted with chest pain   Assessment/Plan   1. A/C Systolic Heart Failure -> Cardiogenic shock - EF 20-25% due to ICM  - Initial RHC with markedly elevated biventricular filling pressures and low cardiac output - Making little if no progress. Now with multisystem organ failure requiring dual inotorpes  - On CVVHD - Coox improved this am. - Doubt she is good candidate for long-term HD. Once she is euvolemic will hold CVVHD and wait for renal recovery. If no renal recovery may be headed toward Palliatvie care  2. 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 Anuric  - Likely due to ATN from surgery and CHF +/- CIN - Continue pressors.   - As above. Not candidate for long-term HD with low cardiac output   3. PAF - Recurrent episode this am. Bolus amio. Continue gtt - Continue heparin. Discussed dosing with PharmD personally.  4. Ischemic RLE - S/p 03/26/2018  Angioplasty and stenting of left common iliac stenosis with VBX stent. Left to right femfemoral bypass - Wound vac in place - VVS following. Appreciate their  assistance.   5. CAD - severe native CAD and graft disease on cath 03/24/18. No revasc options - No s/s of ischemia.     6. Post op anemia - Hgb improved after 2 uPRBCs on 4/8  7. Acute delirium - continue to re-orient. Watch for sepsis. WBC 9.2  CRITICAL CARE Performed by: Arvilla Meres  Total critical care  time: 35 minutes  Critical care time was exclusive of separately billable procedures and treating other patients.  Critical care was necessary to treat or prevent imminent or life-threatening deterioration.  Critical care was time spent personally by me (independent of midlevel providers or residents) on the following activities: development of treatment plan with patient and/or surrogate as well as nursing, discussions with consultants, evaluation of patient's response to treatment, examination of patient, obtaining history from patient or surrogate, ordering and performing treatments and interventions, ordering and review of laboratory studies, ordering and review of radiographic studies, pulse oximetry and re-evaluation of patient's condition.    Length of Stay: 15  Arvilla Meres, MD  03/31/2018, 6:21 AM  Advanced Heart Failure Team Pager [phone] (M-F; 7a - 4p)  Please contact [facility] for night-coverage after hours (4p -7a ) and weekends on [email or web address]

## 2018-03-31 NOTE — Progress Notes (Signed)
1940: Paged Dr. Delton SeeNelson as pt remains in afib with uncontrolled heart rate, 120s-150s. Pt is asymptomatic at this time, but amio gtt was decreased to 30mg /hr at 1600 and has previously needed intermittent boluses of amio to control heart rate. Awaiting call back.  2000: able to reach Dr. Gala RomneyBensimhon, orders for 150mg  amio bolus and increasing amio rate to 60mg /hr. Will implement and continue to monitor closely.

## 2018-04-01 LAB — RENAL FUNCTION PANEL
ALBUMIN: 2.4 g/dL — AB (ref 3.5–5.0)
Albumin: 2.4 g/dL — ABNORMAL LOW (ref 3.5–5.0)
Anion gap: 12 (ref 5–15)
Anion gap: 6 (ref 5–15)
BUN: 8 mg/dL (ref 6–20)
BUN: 6 mg/dL (ref 6–20)
CALCIUM: 7.8 mg/dL — AB (ref 8.9–10.3)
CHLORIDE: 99 mmol/L — AB (ref 101–111)
CO2: 24 mmol/L (ref 22–32)
CO2: 25 mmol/L (ref 22–32)
CREATININE: 1.12 mg/dL — AB (ref 0.44–1.00)
Calcium: 8.1 mg/dL — ABNORMAL LOW (ref 8.9–10.3)
Chloride: 99 mmol/L — ABNORMAL LOW (ref 101–111)
Creatinine, Ser: 1.05 mg/dL — ABNORMAL HIGH (ref 0.44–1.00)
GFR calc Af Amer: 55 mL/min — ABNORMAL LOW (ref >60)
GFR calc non Af Amer: 48 mL/min — ABNORMAL LOW (ref >60)
GFR, EST AFRICAN AMERICAN: 60 mL/min — AB (ref >60)
GFR, EST NON AFRICAN AMERICAN: 51 mL/min — AB (ref >60)
GLUCOSE: 187 mg/dL — AB (ref 65–99)
Glucose, Bld: 253 mg/dL — ABNORMAL HIGH (ref 65–99)
POTASSIUM: 4.2 mmol/L (ref 3.5–5.1)
Phosphorus: 1.4 mg/dL — ABNORMAL LOW (ref 2.5–4.6)
Potassium: 3.9 mmol/L (ref 3.5–5.1)
SODIUM: 130 mmol/L — AB (ref 135–145)
Sodium: 135 mmol/L (ref 135–145)

## 2018-04-01 LAB — CBC
HCT: 31.5 % — ABNORMAL LOW (ref 36.0–46.0)
HEMOGLOBIN: 10 g/dL — AB (ref 12.0–15.0)
MCH: 29.3 pg (ref 26.0–34.0)
MCHC: 31.7 g/dL (ref 30.0–36.0)
MCV: 92.4 fL (ref 78.0–100.0)
Platelets: 162 10*3/uL (ref 150–400)
RBC: 3.41 MIL/uL — AB (ref 3.87–5.11)
RDW: 17.2 % — ABNORMAL HIGH (ref 11.5–15.5)
WBC: 8.4 10*3/uL (ref 4.0–10.5)

## 2018-04-01 LAB — COOXEMETRY PANEL
CARBOXYHEMOGLOBIN: 1.3 % (ref 0.5–1.5)
Carboxyhemoglobin: 1.1 % (ref 0.5–1.5)
METHEMOGLOBIN: 0.8 % (ref 0.0–1.5)
Methemoglobin: 1.5 % (ref 0.0–1.5)
O2 SAT: 50 %
O2 Saturation: 54.6 %
TOTAL HEMOGLOBIN: 10.2 g/dL — AB (ref 12.0–16.0)
Total hemoglobin: 10 g/dL — ABNORMAL LOW (ref 12.0–16.0)

## 2018-04-01 LAB — GLUCOSE, CAPILLARY
GLUCOSE-CAPILLARY: 189 mg/dL — AB (ref 65–99)
Glucose-Capillary: 173 mg/dL — ABNORMAL HIGH (ref 65–99)
Glucose-Capillary: 163 mg/dL — ABNORMAL HIGH (ref 65–99)
Glucose-Capillary: 148 mg/dL — ABNORMAL HIGH (ref 65–99)

## 2018-04-01 LAB — APTT: APTT: 84 s — AB (ref 24–36)

## 2018-04-01 LAB — HEPARIN LEVEL (UNFRACTIONATED): HEPARIN UNFRACTIONATED: 0.33 [IU]/mL (ref 0.30–0.70)

## 2018-04-01 LAB — MAGNESIUM: MAGNESIUM: 2.5 mg/dL — AB (ref 1.7–2.4)

## 2018-04-01 MED ORDER — SODIUM GLYCEROPHOSPHATE 1 MMOLE/ML IV SOLN
30.0000 mmol | Freq: Once | INTRAVENOUS | Status: AC
  Administered 2018-04-01: 30 mmol via INTRAVENOUS
  Filled 2018-04-01: qty 30

## 2018-04-01 MED ORDER — INSULIN GLARGINE 100 UNIT/ML ~~LOC~~ SOLN
5.0000 [IU] | Freq: Every day | SUBCUTANEOUS | Status: DC
Start: 2018-04-01 — End: 2018-04-06
  Administered 2018-04-01 – 2018-04-05 (×5): 5 [IU] via SUBCUTANEOUS
  Filled 2018-04-01 (×6): qty 0.05

## 2018-04-01 NOTE — Progress Notes (Signed)
  Progress Note    04/01/2018 8:18 AM 6 Days Post-Op  Subjective:  Seems down; says her legs feel dead; some days are better than others.   Afebrile HR 70's-120's Afib/NSR 70's-140's systolic 100% [id number]  Vitals:   04/01/18 0800 04/01/18 0803  BP: 104/60   Pulse:    Resp: 12   Temp:  97.8 F (36.6 C)  SpO2:      Physical Exam: general:  No distress Cardiac:  regular Lungs:  Non labored Incisions:  Bilateral groins with Pravena vac in place with good seal Extremities:  +doppler signals bilateral DP/PT; motor in tact Abdomen:  Soft, NT to palpation  CBC    Component Value Date/Time   WBC 8.4 04/01/2018 0427   RBC 3.41 (L) 04/01/2018 0427   HGB 10.0 (L) 04/01/2018 0427   HCT 31.5 (L) 04/01/2018 0427   PLT 162 04/01/2018 0427   MCV 92.4 04/01/2018 0427   MCH 29.3 04/01/2018 0427   MCHC 31.7 04/01/2018 0427   RDW 17.2 (H) 04/01/2018 0427   LYMPHSABS 1.2 03/02/2018 1724   MONOABS 0.5 03/02/2018 1724   EOSABS 0.1 03/02/2018 1724   BASOSABS 0.0 03/02/2018 1724    BMET    Component Value Date/Time   NA 135 04/01/2018 0427   NA 137 03/20/2018 1534   K 4.2 04/01/2018 0427   CL 99 (L) 04/01/2018 0427   CO2 24 04/01/2018 0427   GLUCOSE 187 (H) 04/01/2018 0427   BUN 8 04/01/2018 0427   BUN 44 (H) 03/20/2018 1534   CREATININE 1.12 (H) 04/01/2018 0427   CREATININE 1.65 (H) 05/17/2016 1356   CALCIUM 8.1 (L) 04/01/2018 0427   GFRNONAA 48 (L) 04/01/2018 0427   GFRAA 55 (L) 04/01/2018 0427    INR    Component Value Date/Time   INR 1.17 03/23/2018 0732     Intake/Output Summary (Last 24 hours) at 04/01/2018 0818 Last data filed at 04/01/2018 0800 Gross per 24 hour  Intake 1756.09 ml  Output 4463 ml  Net -2706.91 ml     Assessment:  72 y.o. female is s/p:  Femoral to femoral bypass grafting  6 Days Post-Op  Plan: -pt with +doppler signals bilateral DP/PT; motor in tact -still requiring CRRT and tolerating-will continue per Dr. Allena KatzPatel.  -DVT  prophylaxis:  Heparin gtt for afib   Doreatha MassedSamantha Justine Dines, PA-C [facility] [phone] 04/01/2018 8:18 AM

## 2018-04-01 NOTE — Plan of Care (Signed)
  Problem: Nutrition: Goal: Adequate nutrition will be maintained 04/01/2018 0947 by Marcelino ScotWilson, Laquandra Carrillo G, RN Note:  Appetite improving 04/01/2018 0946 by Marcelino ScotWilson, Ikram Riebe G, RN Outcome: Progressing Note:  Appetite improving   Problem: Coping: Goal: Level of anxiety will decrease Outcome: Progressing   Problem: Elimination: Goal: Will not experience complications related to urinary retention Outcome: Progressing Note:  Anuric on CRRT   Problem: Pain Managment: Goal: General experience of comfort will improve Outcome: Progressing   Problem: Safety: Goal: Ability to remain free from injury will improve Outcome: Progressing   Problem: Skin Integrity: Goal: Risk for impaired skin integrity will decrease Outcome: Progressing   Problem: Education: Goal: Ability to demonstrate management of disease process will improve Outcome: Progressing   Problem: Cardiac: Goal: Ability to achieve and maintain adequate cardiopulmonary perfusion will improve Outcome: Progressing
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
## 2018-04-01 NOTE — Plan of Care (Signed)
  Problem: Education: Goal: Knowledge of General Education information will improve Outcome: Progressing   Problem: Health Behavior/Discharge Planning: Goal: Ability to manage health-related needs will improve Outcome: Progressing   Problem: Clinical Measurements: Goal: Ability to maintain clinical measurements within normal limits will improve 04/01/2018 0320 by Joycelyn ManHimes, Shekia Kuper M, RN Outcome: Progressing Goal: Will remain free from infection 04/01/2018 0320 by Joycelyn ManHimes, Aryaan Persichetti M, RN Outcome: Progressing Goal: Diagnostic test results will improve 04/01/2018 0320 by Joycelyn ManHimes, Mekaila Tarnow M, RN Outcome: Progressing Goal: Respiratory complications will improve 04/01/2018 0320 by Joycelyn ManHimes, Quay Simkin M, RN Outcome: Progressing   Problem: Nutrition: Goal: Adequate nutrition will be maintained 04/01/2018 0320 by Joycelyn ManHimes, Ebert Forrester M, RN Outcome: Progressing  Problem: Safety: Goal: Ability to remain free from injury will improve 04/01/2018 0320 by Joycelyn ManHimes, Mong Neal M, RN Outcome: Progressing   Problem: Skin Integrity: Goal: Risk for impaired skin integrity will decrease 04/01/2018 0320 by Joycelyn ManHimes, Jakin Pavao M, RN Outcome: Progressing   Problem: Education: Goal: Ability to demonstrate management of disease process will improve Outcome: Progressing Goal: Ability to verbalize understanding of medication therapies will improve Outcome: Progressing   Problem: Activity: Goal: Capacity to carry out activities will improve Outcome: Progressing   Problem: Cardiac: Goal: Ability to achieve and maintain adequate cardiopulmonary perfusion will improve 04/01/2018 0320 by Joycelyn ManHimes, Cecilee Rosner M, RN Outcome: Progressing  Problem: Coping: Goal: Level of anxiety will decrease Outcome: Not Progressing   Problem: Elimination: Goal: Will not experience complications related to bowel motility 04/01/2018 0320 by Joycelyn ManHimes, Finnick Orosz M, RN Outcome: Not Progressing Note:  No BM since 3/30, has been on bowel regimen and  received PRN dulcolax this shift. (+) bowel sounds and flatus. Goal: Will not experience complications related to urinary retention 04/01/2018 0320 by Joycelyn ManHimes, Kelise Kuch M, RN Outcome: Not Progressing Note:  Pt is oliguric on CRRT    Problem: Pain Managment: Goal: General experience of comfort will improve 04/01/2018 0320 by Joycelyn ManHimes, Foye Damron M, RN Outcome: Not Progressing Note:  Pt reports generalized discomfort however declines medications and only sometimes agrees to repositioning for comfort. Requests to be turned onto her stomach, explained that this is not feasible while still on CRRT.

## 2018-04-01 NOTE — Progress Notes (Signed)
Patient ID: Hailey Levine, female   DOB: 08/13/1946, 72 y.o.   MRN: [id number] [facility] Progress Note   Assessment/ Plan:   1.  Acute kidney injury on chronic kidney disease stage IV: Suspected to be possibly multifactorial from cardiorenal mechanism with renal hypoperfusion (from CHF exacerbation) and possibly some contrast-induced injury.  She remains anuric and without any evidence of renal recovery, plan to continue CRRT at this time for volume loading, CVP still remains elevated at 15 although she is hypotensive. 2.  CHF exacerbation: With history of ischemic cardiomyopathy/systolic heart failure (EF 20-25%) and started on CRRT for volume unloading after resistant to diuretics. 3.  Cardiogenic shock: Remains on pressors at this time as we attempt volume unloading with CRRT 4.  Right leg ischemia: Postop day #6, status post percutaneous transluminal angioplasty/stenting of left iliac stenosis and left to right femoral-femoral bypass with good Doppler signals and range of motion in both legs noted from vascular surgery assessment. 5.  Anemia: Likely secondary to chronic kidney disease and recent surgical losses-improved status post PRBC transfusion.  Subjective:   Without any acute events overnight-tolerating CRRT without problem CVP improving slowly, .   Objective:   BP 104/60   Pulse 87   Temp 97.8 F (36.6 C) (Oral)   Resp 18   Ht 4\' 9"  (1.448 m)   Wt 69.9 kg (154 lb 1.6 oz)   SpO2 100%   BMI 33.35 kg/m   Intake/Output Summary (Last 24 hours) at 04/01/2018 0830 Last data filed at 04/01/2018 0800 Gross per 24 hour  Intake 1756.09 ml  Output 4463 ml  Net -2706.91 ml   Weight change: -2 kg (-4 lb 6.6 oz)  Physical Exam: Gen: Comfortably resting in bed, awakens and responds to questions easily. CVS: Pulse regular rhythm, normal rate, S1 and S2 normal Resp: Clear breath sounds bilaterally, no rales, no distinct rhonchi Abd: Soft, obese, nontender Ext: Trace  lower extremity edema  Imaging: No results found.  Labs: BMET Recent Labs  Lab 03/29/18 0428 03/29/18 1709 03/30/18 0435 03/30/18 1935 03/31/18 0419 03/31/18 1600 04/01/18 0427  NA 132* 129* 133* 135 133* 137 135  K 4.3 4.4 4.5 4.3 4.2 4.4 4.2  CL 96* 96* 98* 99* 98* 100* 99*  CO2 19* 16* 22 24 23 24 24   GLUCOSE 209* 203* 153* 178* 169* 183* 187*  BUN 52* 56* 30* 17 13 9 8   CREATININE 3.34* 3.75* 2.26* 1.61* 1.41* 1.21* 1.12*  CALCIUM 8.4* 8.2* 7.9* 8.2* 8.1* 8.1* 8.1*  PHOS  --  6.4* 3.2 2.1* 2.0* 1.7* 1.4*   CBC Recent Labs  Lab 03/29/18 0428 03/30/18 0434 03/30/18 1935 03/31/18 0419 04/01/18 0427  WBC 12.1* 9.1  --  9.2 8.4  HGB 7.8* 7.3* 10.0* 10.1* 10.0*  HCT 23.6* 22.7* 30.6* 31.0* 31.5*  MCV 92.5 90.4  --  90.1 92.4  PLT 159 190  --  171 162    Medications:    . aspirin EC  81 mg Oral Daily  . atorvastatin  40 mg Oral Daily  . Chlorhexidine Gluconate Cloth  6 each Topical Daily  . cycloSPORINE  1 drop Both Eyes BID  . fluticasone  1 spray Each Nare Daily  . insulin aspart  0-15 Units Subcutaneous TID WC  . insulin aspart  0-5 Units Subcutaneous QHS  . mouth rinse  15 mL Mouth Rinse BID  . pantoprazole  40 mg Oral Daily  . sodium chloride flush  10-40 mL Intracatheter Q12H  Zetta Bills, MD 04/01/2018, 8:30 AM

## 2018-04-01 NOTE — Progress Notes (Addendum)
Advanced Heart Failure Rounding Note  PCP-Cardiologist: Lesleigh Noe, MD   Subjective:    03/26/2018 S/P Angioplasty and stenting of left common iliac stenosis with VBX stent.Left to right femorofemoral bypass.   Had AF overnight on 4/6. IV amio started.   CVVHD started 03/29/18. Pulling 100-150 cc an hour. 125 cc an hour. Weight down 23 lbs total. Remains anuric.   Awake and alert this am. Denies SOB but not out of bed due to CVVHD. Asks if she is going to die.   Coox 50% -> repeat 54.6% this am on Milrinone 0.125 and NE 9  CVP ~15-16  Objective:   Weight Range: 154 lb 1.6 oz (69.9 kg) Body mass index is 33.35 kg/m.   Vital Signs:   Temp:  [97.7 F (36.5 C)-98.3 F (36.8 C)] 97.8 F (36.6 C) (04/10 0803) Pulse Rate:  [73-135] 80 (04/10 0830) Resp:  [12-27] 18 (04/10 0830) BP: (92-149)/(50-111) 105/74 (04/10 0830) SpO2:  [86 %-100 %] 100 % (04/10 0830) Weight:  [154 lb 1.6 oz (69.9 kg)] 154 lb 1.6 oz (69.9 kg) (04/10 0400) Last BM Date: 03/21/18  Weight change: Filed Weights   03/30/18 0600 03/31/18 0400 04/01/18 0400  Weight: 161 lb 9.6 oz (73.3 kg) 158 lb 8.2 oz (71.9 kg) 154 lb 1.6 oz (69.9 kg)   Intake/Output:   Intake/Output Summary (Last 24 hours) at 04/01/2018 0916 Last data filed at 04/01/2018 0913 Gross per 24 hour  Intake 1761.89 ml  Output 4468 ml  Net -2706.11 ml    Physical Exam   General: Ill appearing. NAD.  HEENT: Normal Neck: Supple. RIJ trialysis.  Carotids 2+ bilat; no bruits. No thyromegaly or nodule noted. Cor: PMI nondisplaced. Regular this am. No M/G/R noted Lungs: CTAB, normal effort. Abdomen: Soft, non-tender, non-distended, no HSM. No bruits or masses. +BS  Extremities: No cyanosis, clubbing, or rash. R and LLE no edema.  Neuro: Alert & orientedx3, cranial nerves grossly intact. moves all 4 extremities w/o difficulty. Affect pleasant   Telemetry   Converted from AF to NSR overnight with IV amiodarone, personally reviewed.     EKG    No new tracings.  Labs    CBC Recent Labs    03/31/18 0419 04/01/18 0427  WBC 9.2 8.4  HGB 10.1* 10.0*  HCT 31.0* 31.5*  MCV 90.1 92.4  PLT 171 162   Basic Metabolic Panel Recent Labs    16/10/96 0419 03/31/18 1600 04/01/18 0427  NA 133* 137 135  K 4.2 4.4 4.2  CL 98* 100* 99*  CO2 23 24 24   GLUCOSE 169* 183* 187*  BUN 13 9 8   CREATININE 1.41* 1.21* 1.12*  CALCIUM 8.1* 8.1* 8.1*  MG 2.4  --  2.5*  PHOS 2.0* 1.7* 1.4*   Liver Function Tests Recent Labs    03/31/18 1600 04/01/18 0427  ALBUMIN 2.4* 2.4*   No results for input(s): LIPASE, AMYLASE in the last 72 hours. Cardiac Enzymes No results for input(s): CKTOTAL, CKMB, CKMBINDEX, TROPONINI in the last 72 hours.  BNP: BNP (last 3 results) No results for input(s): BNP in the last 8760 hours.  ProBNP (last 3 results) Recent Labs    11/07/17 1214 03/02/18 1724 03/20/18 1534  PROBNP 11,244* 2,115.0* 04,540*   D-Dimer No results for input(s): DDIMER in the last 72 hours. Hemoglobin A1C No results for input(s): HGBA1C in the last 72 hours. Fasting Lipid Panel No results for input(s): CHOL, HDL, LDLCALC, TRIG, CHOLHDL, LDLDIRECT in the last  72 hours. Thyroid Function Tests No results for input(s): TSH, T4TOTAL, T3FREE, THYROIDAB in the last 72 hours.  Invalid input(s): FREET3 Other results:  Imaging   No results found.  Medications:    Scheduled Medications: . aspirin EC  81 mg Oral Daily  . atorvastatin  40 mg Oral Daily  . Chlorhexidine Gluconate Cloth  6 each Topical Daily  . cycloSPORINE  1 drop Both Eyes BID  . fluticasone  1 spray Each Nare Daily  . insulin aspart  0-15 Units Subcutaneous TID WC  . insulin aspart  0-5 Units Subcutaneous QHS  . mouth rinse  15 mL Mouth Rinse BID  . pantoprazole  40 mg Oral Daily  . sodium chloride flush  10-40 mL Intracatheter Q12H    Infusions: . sodium chloride    . sodium chloride 10 mL/hr at 04/01/18 0000  . amiodarone 60 mg/hr  (04/01/18 0817)  . heparin 900 Units/hr (04/01/18 0800)  . milrinone 0.125 mcg/kg/min (04/01/18 0800)  . norepinephrine (LEVOPHED) Adult infusion 9 mcg/min (04/01/18 0800)  . dialysis replacement fluid (prismasate) 400 mL/hr at 03/31/18 2156  . dialysis replacement fluid (prismasate) 200 mL/hr at 03/31/18 2153  . dialysate (PRISMASATE) 2,000 mL/hr at 04/01/18 0736  . sodium chloride      PRN Medications: sodium chloride, acetaminophen, albuterol, alteplase, bisacodyl, bisacodyl, diazepam, docusate sodium, guaiFENesin-dextromethorphan, heparin, nitroGLYCERIN, ondansetron (ZOFRAN) IV, ondansetron (ZOFRAN) IV, sodium chloride, sodium chloride flush  Patient Profile   Ms Pates is a 72 year old with a history of CAD S/P CABG x 3 in 2002 with stent 2003, chronic systolic heart failure, htn, hyperlipidemia, DM, and CKD.   Admitted with chest pain   Assessment/Plan   1. A/C Systolic Heart Failure -> Cardiogenic shock - EF 20-25% due to ICM  - Initial RHC with markedly elevated biventricular filling pressures and low cardiac output - Making little if no progress. Now with multisystem organ failure requiring dual inotorpes  - On CVVHD - Repeat Coox 54.6% on Milrinone 0.125 mcg/kg/min and Norepi 9. Continue for now.  - Doubt she is good candidate for long-term HD. Once she is euvolemic will hold CVVHD and wait for renal recovery. If no renal recovery may be headed toward Palliatvie care  2. 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   - Likely due to ATN from surgery and CHF +/- CIN - Continue pressors.   - As above. Not candidate for long-term HD with low cardiac output   3. PAF - Recurrent episode this am. Bolus amio. Continue gtt - Continue heparin. Discussed dosing with PharmD personally.  4. Ischemic RLE - S/p 03/26/2018  POD #6 Angioplasty and stenting of left common iliac stenosis with VBX stent. Left to right femfemoral  bypass - Wound vac in place - VVS following. Appreciate their assistance.   5. CAD - Severe native CAD and graft disease on cath 03/24/18. No revasc options - No s/s of ischemia.   6. Post op anemia - Hgb 10.0 this am. Received 2 uPRBCs on 4/8  7. Acute delirium - Continue to re-orient. Watch for sepsis.  - WBC 8.4.   Length of Stay: [address]  [employer]  04/01/2018, 9:16 AM  Advanced Heart Failure Team Pager [phone] (M-F; 7a - 4p)  Please contact [facility] for night-coverage after hours (4p -7a ) and weekends on [email or web address]  Agree with above.   She remains critically on  multiple pressors. CO-ox marginal. On CVVHD. Anuric. She is back down to baseline weight but CVP still 15. Mental status definitely clearer today. In/out AF.   Exam Lying flat in bed with Bair Hugger RIJ trialysis. JVP to ear Cor IRR  Lungs clear Ab soft NT good BS Ext. Warm trace edema. Wound vacs ok  Neuro: alert & oriented x 3, cranial nerves grossly intact. moves all 4 extremities w/o difficulty. Affect pleasant   Making minimal progress despite extensive support. Will continue CVVHD for 1-2 more days to get CVP down then will stop and see if she has any renal recovery. If no renal recovery will need Hospice consult. If kidneys recover will attempt to wean inotropes slowly. Continue amio/heparin for PAF.   CRITICAL CARE Performed by: Arvilla MeresBensimhon, Kaarin Pardy  Total critical care time: 35 minutes  Critical care time was exclusive of separately billable procedures and treating other patients.  Critical care was necessary to treat or prevent imminent or life-threatening deterioration.  Critical care was time spent personally by me (independent of midlevel providers or residents) on the following activities: development of treatment plan with patient and/or surrogate as well as nursing, discussions with consultants, evaluation of patient's response to treatment, examination of patient,  obtaining history from patient or surrogate, ordering and performing treatments and interventions, ordering and review of laboratory studies, ordering and review of radiographic studies, pulse oximetry and re-evaluation of patient's condition.  Arvilla Meresaniel Shavar Gorka, MD  9:10 PM

## 2018-04-01 NOTE — Progress Notes (Signed)
CRITICAL VALUE ALERT  Critical Value:  Phos 1.0  Date & Time Notied:  04/01/2018 1627  Provider Notified: tex paged Dr. Allena KatzPatel  Orders Received/Actions taken: New orders received

## 2018-04-01 NOTE — Progress Notes (Signed)
ANTICOAGULATION CONSULT NOTE  Pharmacy Consult for Heparin  Indication: atrial fibrillation  Allergies  Allergen Reactions  . Codeine Shortness Of Breath  . Coreg [Carvedilol] Shortness Of Breath and Cough  . Metoprolol Nausea And Vomiting    "Throws up white, foamy liquid"  . Percocet [Oxycodone-Acetaminophen] Nausea And Vomiting  . Hydrocodone Cough    This was in a cough syrup that made her cough WORSE  . Benazepril Cough    Patient Measurements: Height: 4\' 9"  (144.8 cm) Weight: 154 lb 1.6 oz (69.9 kg) IBW/kg (Calculated) : 38.6 Heparin dose wt 60kg  Vital Signs: Temp: 97.9 F (36.6 C) (04/10 0400) Temp Source: Axillary (04/10 0400) BP: 109/56 (04/10 0700) Pulse Rate: 87 (04/10 0400)  Labs: Recent Labs    03/30/18 0434  03/30/18 1935 03/30/18 2338 03/31/18 0419 03/31/18 1600 04/01/18 0427  HGB 7.3*  --  10.0*  --  10.1*  --  10.0*  HCT 22.7*  --  30.6*  --  31.0*  --  31.5*  PLT 190  --   --   --  171  --  162  APTT 69*  --   --   --  79*  --  84*  HEPARINUNFRC  --    < >  --  0.40 0.30  --  0.33  CREATININE  --    < > 1.61*  --  1.41* 1.21* 1.12*   < > = values in this interval not displayed.    Assessment: 71yof admitted with HF, S/p fem pop bypass.  Heparin drip started for anticoagulation. Heparin level this morning within goal range at 0.33. Hemoglobin stable at 10 and slight decrease in platelets.  Patient continues on CRRT this morning with pressor support. Patient also with episode of Afib overnight, requiring amiodarone bolus and increase in rate this morning.    Goal of Therapy:  Heparin level 0.3-0.7 units/ml Monitor platelets by anticoagulation protocol: Yes  Plan:  Continue heparin at 900 units/ hour Daily HL/ CBC  F/u plans for longterm anticoagulation Monitor for S/Sx for bleeding  Blake DivineShannon Nesha Counihan, Pharm.D. PGY1 Pharmacy Resident 04/01/2018 7:13 AM Main Pharmacy: [phone]

## 2018-04-02 ENCOUNTER — Ambulatory Visit: Payer: Medicare HMO | Admitting: Internal Medicine

## 2018-04-02 LAB — RENAL FUNCTION PANEL
ALBUMIN: 2.5 g/dL — AB (ref 3.5–5.0)
ALBUMIN: 2.6 g/dL — AB (ref 3.5–5.0)
ANION GAP: 11 (ref 5–15)
Anion gap: 14 (ref 5–15)
BUN: 5 mg/dL — AB (ref 6–20)
BUN: 5 mg/dL — ABNORMAL LOW (ref 6–20)
CALCIUM: 7.9 mg/dL — AB (ref 8.9–10.3)
CHLORIDE: 100 mmol/L — AB (ref 101–111)
CO2: 24 mmol/L (ref 22–32)
CO2: 23 mmol/L (ref 22–32)
Calcium: 8 mg/dL — ABNORMAL LOW (ref 8.9–10.3)
Chloride: 98 mmol/L — ABNORMAL LOW (ref 101–111)
Creatinine, Ser: 1.08 mg/dL — ABNORMAL HIGH (ref 0.44–1.00)
Creatinine, Ser: 1.24 mg/dL — ABNORMAL HIGH (ref 0.44–1.00)
GFR calc Af Amer: 58 mL/min — ABNORMAL LOW (ref >60)
GFR calc non Af Amer: 50 mL/min — ABNORMAL LOW (ref >60)
GFR, EST AFRICAN AMERICAN: 49 mL/min — AB (ref >60)
GFR, EST NON AFRICAN AMERICAN: 42 mL/min — AB (ref >60)
GLUCOSE: 189 mg/dL — AB (ref 65–99)
GLUCOSE: 222 mg/dL — AB (ref 65–99)
PHOSPHORUS: 2.2 mg/dL — AB (ref 2.5–4.6)
PHOSPHORUS: 2.3 mg/dL — AB (ref 2.5–4.6)
POTASSIUM: 3.9 mmol/L (ref 3.5–5.1)
Potassium: 3.8 mmol/L (ref 3.5–5.1)
SODIUM: 135 mmol/L (ref 135–145)
Sodium: 135 mmol/L (ref 135–145)

## 2018-04-02 LAB — COOXEMETRY PANEL
CARBOXYHEMOGLOBIN: 1.5 % (ref 0.5–1.5)
Carboxyhemoglobin: 1 % (ref 0.5–1.5)
Carboxyhemoglobin: 1.6 % — ABNORMAL HIGH (ref 0.5–1.5)
Methemoglobin: 1.4 % (ref 0.0–1.5)
Methemoglobin: 0.7 % (ref 0.0–1.5)
Methemoglobin: 0.9 % (ref 0.0–1.5)
O2 SAT: 58.3 %
O2 Saturation: 47.4 %
O2 Saturation: 66 %
TOTAL HEMOGLOBIN: 10 g/dL — AB (ref 12.0–16.0)
TOTAL HEMOGLOBIN: 13.1 g/dL (ref 12.0–16.0)
Total hemoglobin: 9.5 g/dL — ABNORMAL LOW (ref 12.0–16.0)

## 2018-04-02 LAB — GLUCOSE, CAPILLARY
GLUCOSE-CAPILLARY: 136 mg/dL — AB (ref 65–99)
Glucose-Capillary: 162 mg/dL — ABNORMAL HIGH (ref 65–99)
Glucose-Capillary: 193 mg/dL — ABNORMAL HIGH (ref 65–99)
Glucose-Capillary: 183 mg/dL — ABNORMAL HIGH (ref 65–99)

## 2018-04-02 LAB — HEPARIN LEVEL (UNFRACTIONATED): Heparin Unfractionated: 0.43 IU/mL (ref 0.30–0.70)

## 2018-04-02 LAB — APTT: aPTT: 106 seconds — ABNORMAL HIGH (ref 24–36)

## 2018-04-02 LAB — MAGNESIUM: Magnesium: 2.4 mg/dL (ref 1.7–2.4)

## 2018-04-02 MED ORDER — AMIODARONE HCL 150 MG/3ML IV SOLN: 150.0000 mg | Freq: Once | INTRAVENOUS | Status: DC

## 2018-04-02 MED ORDER — SODIUM GLYCEROPHOSPHATE 1 MMOLE/ML IV SOLN
30.0000 mmol | Freq: Once | INTRAVENOUS | Status: AC
  Administered 2018-04-02: 30 mmol via INTRAVENOUS
  Filled 2018-04-02: qty 30

## 2018-04-02 MED ORDER — AMIODARONE IV BOLUS ONLY 150 MG/100ML
150.0000 mg | Freq: Once | INTRAVENOUS | Status: AC
  Administered 2018-04-02: 150 mg via INTRAVENOUS
  Filled 2018-04-02: qty 100

## 2018-04-02 NOTE — Progress Notes (Addendum)
Bolused with IV amiodarone earlier. Patient with multisystem failure. Hesitant to use rate control medication given biventricular decompensating HF and severe ICM with low output requiring pressors. Continue IV amiodarone with additional bolus if HR > 140. Avoid IV lopressor for now. Hopefully we can keep her HR in the 100-110s for the time being.   Patient is alert and oriented at this time. Denies any CP, SOB or palpitation. Does not have cardiac awareness. Hopefully will continue to improve on higher dose of milrionone. Prognosis still guarded and she may still decompensate suddenly, therefore require close monitoring.   Ramond DialSigned, Eulogia Dismore PA Pager: [phone]

## 2018-04-02 NOTE — Progress Notes (Addendum)
[facility]  Subjective  - Comfortable A X O x 3 this am.   Objective (!) 113/50 78 98.1 F (36.7 C) (Oral) 15 100%  Intake/Output Summary (Last 24 hours) at 04/02/2018 0747 Last data filed at 04/02/2018 0700 Gross per 24 hour  Intake 1867.55 ml  Output 4729 ml  Net -2861.45 ml   Pravena vacs removed, groins healing well.  Dry 4x4 placed over groin incisions. Doppler signals brisk B PT/DP.  Active range of motion intact as well as sensation B. Heart SR Lungs non labored breathing  Gen A & O x 3 this am, NAD  Assessment/Planning: POD # 7 Femoral to femoral bypass grafting   Vascular disposition stable.  Keep dry guaze over groin incisions. Heart failure team for cardiac issue CRRT nephrology following  Mosetta Pigeonmma Maureen Collins 04/02/2018 7:47 AM --  Laboratory Lab Results: Recent Labs    03/31/18 0419 04/01/18 0427  WBC 9.2 8.4  HGB 10.1* 10.0*  HCT 31.0* 31.5*  PLT 171 162   BMET Recent Labs    04/01/18 1527 04/02/18 0418  NA 130* 135  K 3.9 3.8  CL 99* 100*  CO2 25 24  GLUCOSE 253* 189*  BUN 6 5*  CREATININE 1.05* 1.08*  CALCIUM 7.8* 8.0*    COAG Lab Results  Component Value Date   INR 1.17 03/23/2018   INR 1.09 04/05/2014   No results found for: PTT  I have interviewed the patient and examined the patient. I agree with the findings by the PA. Both feet are warm and well-perfused. I removed her Prevena dressings in both groin incisions look fine.  Cari Carawayhris Hilary Milks, MD [phone]

## 2018-04-02 NOTE — Progress Notes (Signed)
Patient ID: Hailey JonesMarilyn P Levine, female   DOB: 09/03/1946, 72 y.o.   MRN: [id number] [facility] Progress Note   Assessment/ Plan:   1.  Acute kidney injury on chronic kidney disease stage IV: Suspected to be possibly multifactorial from cardiorenal mechanism with renal hypoperfusion (from CHF exacerbation) and possibly some contrast-induced injury.  Without evidence of renal recovery and will continue CRRT based on her CVP recheck of 9 cmH2O-the goal is to try and get it to 4-6.  We had a brief discussion about her low blood pressures and poor ejection fraction as being limitations for outpatient hemodialysis and I informed her that if we stopped CRRT and she did not have any meaningful renal recovery (which I doubt she would have with her underlying chronic kidney disease) we should consider palliative/comfort care. 2.  CHF exacerbation: With history of ischemic cardiomyopathy/systolic heart failure (EF 20-25%) and started on CRRT for volume unloading after resistant to diuretics. 3.  Cardiogenic shock: Remains on pressors at this time as we attempt volume unloading with CRRT 4.  Right leg ischemia: Postop day #7, status post percutaneous transluminal angioplasty/stenting of left iliac stenosis and left to right femoral-femoral bypass with good Doppler signals and range of motion in both legs noted from vascular surgery assessment. 5.  Anemia: Likely secondary to chronic kidney disease and recent surgical losses-improved status post PRBC transfusion.  Subjective:   Denies any acute issues overnight-still having vivid dreams.   Objective:   BP (!) 128/59   Pulse 84   Temp 98.1 F (36.7 C) (Oral)   Resp 17   Ht 4\' 9"  (1.448 m)   Wt 69.6 kg (153 lb 7 oz)   SpO2 100%   BMI 33.20 kg/m   Intake/Output Summary (Last 24 hours) at 04/02/2018 0827 Last data filed at 04/02/2018 0700 Gross per 24 hour  Intake 1804.15 ml  Output 4563 ml  Net -2758.85 ml   Weight change: -0.3 kg (-10.6  oz)  Physical Exam: Gen: Comfortably resting in bed, awake and engages in fruitful conversation. CVS: Pulse regular rhythm, normal rate, S1 and S2 normal Resp: Anteriorly clear to auscultation bilaterally, no rales, no distinct rhonchi Abd: Soft, obese, nontender Ext: Trace edema over back, no edema over lower extremities  Imaging: No results found.  Labs: BMET Recent Labs  Lab 03/30/18 0435 03/30/18 1935 03/31/18 0419 03/31/18 1600 04/01/18 0427 04/01/18 1527 04/02/18 0418  NA 133* 135 133* 137 135 130* 135  K 4.5 4.3 4.2 4.4 4.2 3.9 3.8  CL 98* 99* 98* 100* 99* 99* 100*  CO2 22 24 23 24 24 25 24   GLUCOSE 153* 178* 169* 183* 187* 253* 189*  BUN 30* 17 13 9 8 6  5*  CREATININE 2.26* 1.61* 1.41* 1.21* 1.12* 1.05* 1.08*  CALCIUM 7.9* 8.2* 8.1* 8.1* 8.1* 7.8* 8.0*  PHOS 3.2 2.1* 2.0* 1.7* 1.4* <1.0* 2.2*   CBC Recent Labs  Lab 03/29/18 0428 03/30/18 0434 03/30/18 1935 03/31/18 0419 04/01/18 0427  WBC 12.1* 9.1  --  9.2 8.4  HGB 7.8* 7.3* 10.0* 10.1* 10.0*  HCT 23.6* 22.7* 30.6* 31.0* 31.5*  MCV 92.5 90.4  --  90.1 92.4  PLT 159 190  --  171 162    Medications:    . aspirin EC  81 mg Oral Daily  . atorvastatin  40 mg Oral Daily  . Chlorhexidine Gluconate Cloth  6 each Topical Daily  . cycloSPORINE  1 drop Both Eyes BID  . fluticasone  1  spray Each Nare Daily  . insulin aspart  0-15 Units Subcutaneous TID WC  . insulin aspart  0-5 Units Subcutaneous QHS  . insulin glargine  5 Units Subcutaneous QHS  . mouth rinse  15 mL Mouth Rinse BID  . pantoprazole  40 mg Oral Daily  . sodium chloride flush  10-40 mL Intracatheter Q12H   Zetta Bills, MD 04/02/2018, 8:27 AM

## 2018-04-02 NOTE — Progress Notes (Signed)
17:39 Pt now in Afib RVR. Paged Lisabeth DevoidMeng, PA x 2.   18:36 PA order for 150 mg IV amio bolus. Will administer and monitor closely.  Delories HeinzMelissa Marisue Canion, RN

## 2018-04-02 NOTE — Progress Notes (Signed)
ANTICOAGULATION CONSULT NOTE  Pharmacy Consult for Heparin  Indication: atrial fibrillation  Allergies  Allergen Reactions  . Codeine Shortness Of Breath  . Coreg [Carvedilol] Shortness Of Breath and Cough  . Metoprolol Nausea And Vomiting    "Throws up white, foamy liquid"  . Percocet [Oxycodone-Acetaminophen] Nausea And Vomiting  . Hydrocodone Cough    This was in a cough syrup that made her cough WORSE  . Benazepril Cough    Patient Measurements: Height: 4\' 9"  (144.8 cm) Weight: 153 lb 7 oz (69.6 kg) IBW/kg (Calculated) : 38.6 Heparin dose wt 60kg  Vital Signs: Temp: 98.1 F (36.7 C) (04/11 0423) Temp Source: Oral (04/11 0423) BP: 128/59 (04/11 0803) Pulse Rate: 84 (04/11 0803)  Labs: Recent Labs    03/30/18 1935  03/31/18 0419  04/01/18 0427 04/01/18 1527 04/02/18 0418  HGB 10.0*  --  10.1*  --  10.0*  --   --   HCT 30.6*  --  31.0*  --  31.5*  --   --   PLT  --   --  171  --  162  --   --   APTT  --   --  79*  --  84*  --  106*  HEPARINUNFRC  --    < > 0.30  --  0.33  --  0.43  CREATININE 1.61*  --  1.41*   < > 1.12* 1.05* 1.08*   < > = values in this interval not displayed.    Assessment: 71yof admitted with HF, S/p fem pop bypass.  Heparin drip started for anticoagulation. Heparin level this morning within goal range at 0.33. Hemoglobin and platelets are stable.  Patient continues on CRRT this morning with pressor support, milrinone increase this am.    Goal of Therapy:  Heparin level 0.3-0.7 units/ml Monitor platelets by anticoagulation protocol: Yes  Plan:  Continue heparin at 900 units/ hour Daily HL/ CBC  F/u plans for longterm anticoagulation Monitor for S/Sx for bleeding  Blake DivineShannon Keona Sheffler, Pharm.D. PGY1 Pharmacy Resident 04/02/2018 8:14 AM Main Pharmacy: [phone]

## 2018-04-02 NOTE — Progress Notes (Addendum)
Advanced Heart Failure Rounding Note  PCP-Cardiologist: Lesleigh NoeHenry W Smith III, MD   Subjective:    03/26/2018 S/P Angioplasty and stenting of left common iliac stenosis with VBX stent.Left to right femorofemoral bypass.   In/out of AF. Now on IV amio.   CVVHD started 03/29/18. Pulling ~ 100 cc an hour. Weight down 24 lbs overall. Remains anuric.   Awake and alert. Feels tired and sore in her legs. Denies orthopnea or PND. No CP   Weight shows down 1 lb. Asking if we think she will die.   Coox 47.4% this am on Milrinone 0.125 and NE 10  CVP 8-9  Objective:   Weight Range: 153 lb 7 oz (69.6 kg) Body mass index is 33.2 kg/m.   Vital Signs:   Temp:  [97.4 F (36.3 C)-98.7 F (37.1 C)] 98.1 F (36.7 C) (04/11 0423) Pulse Rate:  [74-157] 78 (04/11 0600) Resp:  [12-24] 15 (04/11 0600) BP: (89-136)/(34-95) 113/50 (04/11 0600) SpO2:  [82 %-100 %] 100 % (04/11 0600) Weight:  [153 lb 7 oz (69.6 kg)] 153 lb 7 oz (69.6 kg) (04/11 0405) Last BM Date: 04/01/18  Weight change: Filed Weights   03/31/18 0400 04/01/18 0400 04/02/18 0405  Weight: 158 lb 8.2 oz (71.9 kg) 154 lb 1.6 oz (69.9 kg) 153 lb 7 oz (69.6 kg)   Intake/Output:   Intake/Output Summary (Last 24 hours) at 04/02/2018 0751 Last data filed at 04/02/2018 0700 Gross per 24 hour  Intake 1867.55 ml  Output 4729 ml  Net -2861.45 ml    Physical Exam   General: Ill appearing.  HEENT: Normal Neck: Supple. RIJ trialysis.  Carotids 2+ bilat; no bruits. No thyromegaly or nodule noted. Cor: PMI nondisplaced. RRR, No M/G/R noted Lungs: CTAB, normal effort. Abdomen: Soft, non-tender, non-distended, no HSM. No bruits or masses. +BS  Extremities: No cyanosis, clubbing, or rash. R and LLE no edema.  Neuro: Alert & orientedx3, cranial nerves grossly intact. moves all 4 extremities w/o difficulty. Affect pleasant   Telemetry   NSR 80s, personally reviewed.   EKG    No new tracings.    Labs    CBC Recent Labs     03/31/18 0419 04/01/18 0427  WBC 9.2 8.4  HGB 10.1* 10.0*  HCT 31.0* 31.5*  MCV 90.1 92.4  PLT 171 162   Basic Metabolic Panel Recent Labs    16/09/9603/10/19 0427 04/01/18 1527 04/02/18 0418  NA 135 130* 135  K 4.2 3.9 3.8  CL 99* 99* 100*  CO2 24 25 24   GLUCOSE 187* 253* 189*  BUN 8 6 5*  CREATININE 1.12* 1.05* 1.08*  CALCIUM 8.1* 7.8* 8.0*  MG 2.5*  --  2.4  PHOS 1.4* <1.0* 2.2*   Liver Function Tests Recent Labs    04/01/18 1527 04/02/18 0418  ALBUMIN 2.4* 2.5*   No results for input(s): LIPASE, AMYLASE in the last 72 hours. Cardiac Enzymes No results for input(s): CKTOTAL, CKMB, CKMBINDEX, TROPONINI in the last 72 hours.  BNP: BNP (last 3 results) No results for input(s): BNP in the last 8760 hours.  ProBNP (last 3 results) Recent Labs    11/07/17 1214 03/02/18 1724 03/20/18 1534  PROBNP 11,244* 2,115.0* 04,54032,943*   D-Dimer No results for input(s): DDIMER in the last 72 hours. Hemoglobin A1C No results for input(s): HGBA1C in the last 72 hours. Fasting Lipid Panel No results for input(s): CHOL, HDL, LDLCALC, TRIG, CHOLHDL, LDLDIRECT in the last 72 hours. Thyroid Function Tests No results for  input(s): TSH, T4TOTAL, T3FREE, THYROIDAB in the last 72 hours.  Invalid input(s): FREET3 Other results:  Imaging   No results found.  Medications:    Scheduled Medications: . aspirin EC  81 mg Oral Daily  . atorvastatin  40 mg Oral Daily  . Chlorhexidine Gluconate Cloth  6 each Topical Daily  . cycloSPORINE  1 drop Both Eyes BID  . fluticasone  1 spray Each Nare Daily  . insulin aspart  0-15 Units Subcutaneous TID WC  . insulin aspart  0-5 Units Subcutaneous QHS  . insulin glargine  5 Units Subcutaneous QHS  . mouth rinse  15 mL Mouth Rinse BID  . pantoprazole  40 mg Oral Daily  . sodium chloride flush  10-40 mL Intracatheter Q12H    Infusions: . sodium chloride    . sodium chloride 10 mL/hr at 04/02/18 0600  . amiodarone 60 mg/hr (04/02/18 0600)  .  heparin 900 Units/hr (04/02/18 0600)  . milrinone 0.125 mcg/kg/min (04/02/18 0600)  . norepinephrine (LEVOPHED) Adult infusion 10.027 mcg/min (04/02/18 0600)  . dialysis replacement fluid (prismasate) 400 mL/hr at 04/01/18 2000  . dialysis replacement fluid (prismasate) 200 mL/hr at 04/01/18 2000  . dialysate (PRISMASATE) 2,000 mL/hr at 04/02/18 0554  . sodium chloride      PRN Medications: sodium chloride, acetaminophen, albuterol, alteplase, bisacodyl, bisacodyl, diazepam, docusate sodium, guaiFENesin-dextromethorphan, heparin, nitroGLYCERIN, ondansetron (ZOFRAN) IV, ondansetron (ZOFRAN) IV, sodium chloride, sodium chloride flush  Patient Profile   Ms Hoffmeister is a 72 year old with a history of CAD S/P CABG x 3 in 2002 with stent 2003, chronic systolic heart failure, htn, hyperlipidemia, DM, and CKD.   Admitted with chest pain   Assessment/Plan   1. A/C Systolic Heart Failure -> Cardiogenic shock - EF 20-25% due to ICM  - Initial RHC with markedly elevated biventricular filling pressures and low cardiac output - Making little to no progress. Now with multisystem organ failure requiring dual inotropes.  - On CVVHD  - Coox 47.4%. Increase milrinone to 0.25 mcg/kg/min and Norepi 10. May need to involve palliative services.  - Doubt she is good candidate for long-term HD. Once she is euvolemic will hold CVVHD and wait for renal recovery. If no renal recovery may be headed toward Palliatvie care  2. ARF on CKD IV -> Started on CVVHD 03/29/18  - Creatinine baseline 1.7-1.9  - Remains anuric on CVVHD.  - Likely due to ATN from surgery and CHF +/- CIN - Continue pressors.   - As above. Not candidate for long-term HD with low cardiac output   3. PAF - Recurrent episode this am. Bolus amio. Continue gtt - Continue heparin. Discussed dosing with PharmD personally.  4. Ischemic RLE - S/p 03/26/2018  POD #7 Angioplasty and stenting of left common iliac stenosis with VBX stent. Left to right  femfemoral bypass - Wound vac in place - VVS following. Appreciate their assistance.   5. CAD - Severe native CAD and graft disease on cath 03/24/18. No revasc options - No s/s of ischemia.     6. Post op anemia - Hgb 10.0 04/01/18. Received 2 uPRBCs on 4/8. No bleeding.  7. Acute delirium - Continue to re-orient. Watch for sepsis.  - WBC 8.4 04/01/18. Much more alert this am.   Patient is critically ill and in multiple system organ failure.  Prognosis guarded. Will increase milrinone to continue support. May be nearing palliative care.   Length of Stay: [address], [place]  04/02/2018, 7:51  AM  Advanced Heart Failure Team Pager [phone] (M-F; 7a - 4p)  Please contact [facility] for night-coverage after hours (4p -7a ) and weekends on [email or web address]  Agree  Remains on CVVHD. Weight down considerably and nearing euvolemia. Remains on multiple pressures with marginal BP and co-ox < 50%. Has been in/out of AF and now on amio gtt. LE wounds doing well. On heparin with no bleeding.   On exam Lying in bed. Weak appearing. NAD.  RIJ TLC site ok JVP 8-9 Cor RRR Lungs clear Ab soft NT good BS Ext. Bilateral groin wound vacs. Minimal edema.  Neuro: alert & oriented x 3, cranial nerves grossly intact. moves all 4 extremities w/o difficulty. Affect pleasant  Remains critically ill. Suspect we can stop CVVHD tomorrow and then wait to see if kidneys will recover. Given poor baseline GFR, I worry that the chance is slim. Will continue inotropic support to maximize chance of renal recovery. If no recovery will need palliative care likely. Continue amio and heparin for AF.   CRITICAL CARE Performed by: Arvilla Meres  Total critical care time: 35 minutes  Critical care time was exclusive of separately billable procedures and treating other patients.  Critical care was necessary to treat or prevent imminent or life-threatening deterioration.  Critical care was time spent  personally by me (independent of midlevel providers or residents) on the following activities: development of treatment plan with patient and/or surrogate as well as nursing, discussions with consultants, evaluation of patient's response to treatment, examination of patient, obtaining history from patient or surrogate, ordering and performing treatments and interventions, ordering and review of laboratory studies, ordering and review of radiographic studies, pulse oximetry and re-evaluation of patient's condition.  Arvilla Meres, MD  9:47 PM

## 2018-04-03 DIAGNOSIS — Z515 Encounter for palliative care: Secondary | ICD-10-CM

## 2018-04-03 DIAGNOSIS — Z7189 Other specified counseling: Secondary | ICD-10-CM

## 2018-04-03 LAB — RENAL FUNCTION PANEL
ALBUMIN: 2.5 g/dL — AB (ref 3.5–5.0)
ANION GAP: 11 (ref 5–15)
ANION GAP: 12 (ref 5–15)
Albumin: 2.3 g/dL — ABNORMAL LOW (ref 3.5–5.0)
BUN: 6 mg/dL (ref 6–20)
BUN: 10 mg/dL (ref 6–20)
CO2: 24 mmol/L (ref 22–32)
CO2: 23 mmol/L (ref 22–32)
CREATININE: 1.72 mg/dL — AB (ref 0.44–1.00)
Calcium: 8 mg/dL — ABNORMAL LOW (ref 8.9–10.3)
Calcium: 8.1 mg/dL — ABNORMAL LOW (ref 8.9–10.3)
Chloride: 99 mmol/L — ABNORMAL LOW (ref 101–111)
Chloride: 97 mmol/L — ABNORMAL LOW (ref 101–111)
Creatinine, Ser: 1.13 mg/dL — ABNORMAL HIGH (ref 0.44–1.00)
GFR calc non Af Amer: 47 mL/min — ABNORMAL LOW (ref >60)
GFR calc non Af Amer: 28 mL/min — ABNORMAL LOW (ref >60)
GFR, EST AFRICAN AMERICAN: 54 mL/min — AB (ref >60)
GFR, EST AFRICAN AMERICAN: 33 mL/min — AB (ref >60)
GLUCOSE: 209 mg/dL — AB (ref 65–99)
GLUCOSE: 232 mg/dL — AB (ref 65–99)
PHOSPHORUS: 1.8 mg/dL — AB (ref 2.5–4.6)
PHOSPHORUS: 1.4 mg/dL — AB (ref 2.5–4.6)
Potassium: 3.9 mmol/L (ref 3.5–5.1)
Potassium: 3.7 mmol/L (ref 3.5–5.1)
Sodium: 134 mmol/L — ABNORMAL LOW (ref 135–145)
Sodium: 132 mmol/L — ABNORMAL LOW (ref 135–145)

## 2018-04-03 LAB — HEPARIN LEVEL (UNFRACTIONATED): Heparin Unfractionated: 0.54 IU/mL (ref 0.30–0.70)

## 2018-04-03 LAB — GLUCOSE, CAPILLARY
GLUCOSE-CAPILLARY: 253 mg/dL — AB (ref 65–99)
GLUCOSE-CAPILLARY: 282 mg/dL — AB (ref 65–99)
Glucose-Capillary: 153 mg/dL — ABNORMAL HIGH (ref 65–99)
Glucose-Capillary: 223 mg/dL — ABNORMAL HIGH (ref 65–99)

## 2018-04-03 LAB — COOXEMETRY PANEL
CARBOXYHEMOGLOBIN: 1.9 % — AB (ref 0.5–1.5)
Methemoglobin: 1 % (ref 0.0–1.5)
O2 SAT: 56.5 %
Total hemoglobin: 8.7 g/dL — ABNORMAL LOW (ref 12.0–16.0)

## 2018-04-03 LAB — MAGNESIUM: Magnesium: 2.3 mg/dL (ref 1.7–2.4)

## 2018-04-03 LAB — APTT: APTT: 108 s — AB (ref 24–36)

## 2018-04-03 MED ORDER — DIPHENHYDRAMINE HCL 25 MG PO CAPS
25.0000 mg | ORAL_CAPSULE | Freq: Every evening | ORAL | Status: DC | PRN
  Administered 2018-04-03 – 2018-04-04 (×2): 25 mg via ORAL
  Filled 2018-04-03 (×2): qty 1

## 2018-04-03 NOTE — Plan of Care (Signed)
  Problem: Education: Goal: Knowledge of General Education information will improve Outcome: Progressing   Problem: Health Behavior/Discharge Planning: Goal: Ability to manage health-related needs will improve Outcome: Progressing   Problem: Clinical Measurements: Goal: Ability to maintain clinical measurements within normal limits will improve Outcome: Progressing Goal: Will remain free from infection Outcome: Progressing Goal: Diagnostic test results will improve Outcome: Progressing Goal: Respiratory complications will improve Outcome: Progressing   Problem: Nutrition: Goal: Adequate nutrition will be maintained Outcome: Progressing   Problem: Coping: Goal: Level of anxiety will decrease Outcome: Progressing   Problem: Elimination: Goal: Will not experience complications related to bowel motility Outcome: Progressing   Problem: Pain Managment: Goal: General experience of comfort will improve Outcome: Progressing   Problem: Safety: Goal: Ability to remain free from injury will improve Outcome: Progressing   Problem: Skin Integrity: Goal: Risk for impaired skin integrity will decrease Outcome: Progressing   Problem: Education: Goal: Ability to demonstrate management of disease process will improve Outcome: Progressing Goal: Ability to verbalize understanding of medication therapies will improve Outcome: Progressing   Problem: Activity: Goal: Capacity to carry out activities will improve Outcome: Progressing   Problem: Cardiac: Goal: Ability to achieve and maintain adequate cardiopulmonary perfusion will improve Outcome: Progressing

## 2018-04-03 NOTE — Progress Notes (Signed)
ANTICOAGULATION CONSULT NOTE  Pharmacy Consult for Heparin  Indication: atrial fibrillation  Allergies  Allergen Reactions  . Codeine Shortness Of Breath  . Coreg [Carvedilol] Shortness Of Breath and Cough  . Metoprolol Nausea And Vomiting    "Throws up white, foamy liquid"  . Percocet [Oxycodone-Acetaminophen] Nausea And Vomiting  . Hydrocodone Cough    This was in a cough syrup that made her cough WORSE  . Benazepril Cough    Patient Measurements: Height:  (144.8 cm) Weight: 151 lb 0.2 oz (68.5 kg) IBW/kg (Calculated) : 38.6 Heparin dose wt 60kg  Vital Signs: Temp: 98.5 F (36.9 C) (04/12 0817) Temp Source: Oral (04/12 0817) BP: 113/51 (04/12 0800) Pulse Rate: 93 (04/12 0800)  Labs: Recent Labs    04/01/18 0427  04/02/18 0418 04/02/18 1625 04/03/18 0353  HGB 10.0*  --   --   --   --   HCT 31.5*  --   --   --   --   PLT 162  --   --   --   --   APTT 84*  --  106*  --  108*  HEPARINUNFRC 0.33  --  0.43  --  0.54  CREATININE 1.12*   < > 1.08* 1.24* 1.13*   < > = values in this interval not displayed.    Assessment: 71yof admitted with HF, S/p fem pop bypass.  Heparin drip started for anticoagulation for atrial fibrillation. Patient continues to have episodes of AF. Amiodarone continues.  Heparin level this morning within goal range at 0.54. Hemoglobin and platelets are stable.  Plan for CRRT to stop today.    Goal of Therapy:  Heparin level 0.3-0.7 units/ml Monitor platelets by anticoagulation protocol: Yes  Plan:  Continue heparin at 900 units/ hour Daily HL/ CBC  F/u plans for longterm anticoagulation Monitor for S/Sx for bleeding  Blake Divine, Pharm.D. PGY1 Pharmacy Resident 04/03/2018 8:47 AM Main Pharmacy: [phone]

## 2018-04-03 NOTE — Plan of Care (Signed)
  Problem: Education: Goal: Knowledge of General Education information will improve Outcome: Progressing   Problem: Clinical Measurements: Goal: Ability to maintain clinical measurements within normal limits will improve Outcome: Progressing Goal: Will remain free from infection Outcome: Progressing Goal: Diagnostic test results will improve Outcome: Progressing Goal: Respiratory complications will improve Outcome: Progressing   Problem: Nutrition: Goal: Adequate nutrition will be maintained Outcome: Progressing   Problem: Coping: Goal: Level of anxiety will decrease Outcome: Progressing   Problem: Elimination: Goal: Will not experience complications related to bowel motility Outcome: Progressing   Problem: Safety: Goal: Ability to remain free from injury will improve Outcome: Progressing   Problem: Skin Integrity: Goal: Risk for impaired skin integrity will decrease Outcome: Progressing   Problem: Education: Goal: Ability to verbalize understanding of medication therapies will improve Outcome: Progressing   Problem: Cardiac: Goal: Ability to achieve and maintain adequate cardiopulmonary perfusion will improve Outcome: Progressing   Problem: Health Behavior/Discharge Planning: Goal: Ability to manage health-related needs will improve Outcome: Not Progressing   Problem: Pain Managment: Goal: General experience of comfort will improve Outcome: Not Progressing   Problem: Education: Goal: Ability to demonstrate management of disease process will improve Outcome: Not Progressing   Problem: Activity: Goal: Capacity to carry out activities will improve Outcome: Not Progressing

## 2018-04-03 NOTE — Consult Note (Signed)
Consultation Note Date: 04/03/2018   Patient Name: Hailey Levine  DOB: 1946-06-02  MRN: [id number]  Age / Sex: 72 y.o., female  PCP: Hailey Bayley, DO Referring Physician: Angelia Levine, *  Reason for Consultation: Establishing goals of care and Psychosocial/spiritual support  HPI/Patient Profile: 72 y.o. female  with past medical history of CAD s/p CABG 2002, stent 2003, HF (EF 20-25%), HTN, HLD, DM, and CKD  admitted on 03/20/2018 with chest pain - NSTEMI. Hospital course complicated with acute systolic heart failure leading to cardiogenic shock and acute renal failure. Pt currently in ICU on pressor support. She was on CVVHD 4/7-4/12. Per nephrology, no evidence of renal recovery and she would not tolerate outpatient hemodialysis d/t HF.   PMT consulted for Hailey Levine.  Clinical Assessment and Goals of Care: I have reviewed medical records including EPIC notes, labs and imaging,  assessed the patient and then met at the bedside along with patient and her brother-in-law, Hailey Levine,  to discuss diagnosis prognosis, [place], EOL wishes, disposition and options.  I introduced Palliative Medicine as specialized medical care for people living with serious illness. It focuses on providing relief from the symptoms and stress of a serious illness. The goal is to improve quality of life for both the patient and the family.  We discussed a brief life review of the patient. 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 Her husband passed away five years ago with pancreatic cancer. They did not have any children. She has 2 sisters who she is very close to - one in [place] and one in [place].   As far as functional and nutritional status, she was fully functioning and had an excellent appetite prior to hospitalization. Her current state of function is a significant change from baseline.    We discussed her  current illness and what it means in the larger context of her on-going co-morbidities.  Natural disease trajectory and expectations at EOL were discussed. Specifically, we discussed her heart failure and renal failure. She tells me she is hopeful her kidneys recover but also aware that there is a high chance they do not. She also tells me "I know my heart is very weak". She tells me "I don't want to die, but I don't want to live like this".  I attempted to elicit values and goals of care important to the patient.  She shares with me about her husband's death and how he was miserable because of chemotherapy and she wishes he had not received it and would have passed peacefully. She also tells me of her mother's death six years ago - she had advanced dementia and was non-ambulatory the last 6 years of her life. She tells me she would never want this for herself. She tells me that quality of life is very important to her.   The difference between aggressive medical intervention and comfort care was considered in light of the patient's goals of care. We discussed that if the kidneys do not improve, she desires comfort care. She desires to continue current therapy and pressor support until then.   Advanced directives, concepts specific to code status, artifical feeding and hydration, and rehospitalization were considered and discussed. We reviewed her living will and I placed a copy in her chart. Her sisters are HCPOA - first Laddie Aquas, then [place]. We discussed code status thoroughly - she elects DNR.   Hospice and Palliative Care services  outpatient were explained and offered. Her brother-in-law, Hailey Levine, wanted more information about hospice care if the kidneys did not improve so we discussed residential hospice.   Questions and concerns were addressed.  The family was encouraged to call with questions or concerns.    Primary Decision Maker PATIENT    SUMMARY OF RECOMMENDATIONS   -code status changed to  DNR -give kidneys time off of CVVHD - if no improvement pt understands she is not a good candidate for HD and does not want it -comfort care if no improvement -PMT will follow up next week  Code Status/Advance Care Planning:  DNR   Symptom Management:   Per primary team  Palliative Prophylaxis:   Aspiration and Frequent Pain Assessment  Additional Recommendations (Limitations, Scope, Preferences):  No Hemodialysis, DNR, continue pressor support for now  Psycho-social/Spiritual:   Desire for further Chaplaincy support:no  Additional Recommendations: Education on Hospice  Prognosis:   Unable to determine - depends on kidney function over the next few days  Discharge Planning: To Be Determined      Primary Diagnoses: Present on Admission: . NSTEMI (non-ST elevated myocardial infarction) ([place]) . Acute on chronic combined systolic and diastolic CHF (congestive heart failure) ([place]) . Coronary artery disease involving coronary bypass graft of native heart with angina pectoris ([place]) . CKD stage 3 due to type 2 diabetes mellitus ([place]) . Diabetes mellitus type 2 in obese ([place]) . Hyperlipidemia . Essential hypertension . Ischemic leg   I have reviewed the medical record, interviewed the patient and family, and examined the patient. The following aspects are pertinent.  Past Medical History:  Diagnosis Date  . Arthritis   . CKD (chronic kidney disease)   . Coronary artery disease   . Depression   . Diabetes mellitus without complication ([place])   . Hyperlipidemia   . Hypertension   . Myocardial infarction [facility]) 2002   MI x 2 - one prior to 2002   Social History   Socioeconomic History  . Marital status: Widowed    Spouse name: Not on file  . Number of children: Not on file  . Years of education: Not on file  . Highest education level: Not on file  Occupational History  . Occupation: retired  Scientific laboratory technician  . Financial resource strain: Not on file  . Food  insecurity:    Worry: Not on file    Inability: Not on file  . Transportation needs:    Medical: Not on file    Non-medical: Not on file  Tobacco Use  . Smoking status: Former Smoker    Packs/day: 0.75    Years: 36.00    Pack years: 27.00    Types: Cigarettes    Last attempt to quit: 12/16/1998    Years since quitting: 19.3  . Smokeless tobacco: Never Used  Substance and Sexual Activity  . Alcohol use: Yes    Alcohol/week: 0.0 oz    Comment: strawberry dacquiri once a year  . Drug use: No  . Sexual activity: Not on file  Lifestyle  . Physical activity:    Days per week: Not on file    Minutes per session: Not on file  . Stress: Not on file  Relationships  . Social connections:    Talks on phone: Not on file    Gets together: Not on file    Attends religious service: Not on file    Active member of club or organization: Not on file    Attends meetings  of clubs or organizations: Not on file    Relationship status: Not on file  Other Topics Concern  . Not on file  Social History Narrative  . Not on file   Family History  Problem Relation Age of Onset  . Heart attack Father 6   Scheduled Meds: . aspirin EC  81 mg Oral Daily  . atorvastatin  40 mg Oral Daily  . Chlorhexidine Gluconate Cloth  6 each Topical Daily  . cycloSPORINE  1 drop Both Eyes BID  . fluticasone  1 spray Each Nare Daily  . insulin aspart  0-15 Units Subcutaneous TID WC  . insulin aspart  0-5 Units Subcutaneous QHS  . insulin glargine  5 Units Subcutaneous QHS  . mouth rinse  15 mL Mouth Rinse BID  . pantoprazole  40 mg Oral Daily  . sodium chloride flush  10-40 mL Intracatheter Q12H   Continuous Infusions: . sodium chloride    . sodium chloride Stopped (04/03/18 1315)  . amiodarone 60 mg/hr (04/03/18 1500)  . heparin 900 Units/hr (04/03/18 1500)  . milrinone 0.25 mcg/kg/min (04/03/18 1500)  . norepinephrine (LEVOPHED) Adult infusion 8 mcg/min (04/03/18 1500)  . sodium chloride     PRN  Meds:.sodium chloride, acetaminophen, albuterol, alteplase, bisacodyl, bisacodyl, diazepam, docusate sodium, guaiFENesin-dextromethorphan, heparin, nitroGLYCERIN, ondansetron (ZOFRAN) IV, ondansetron (ZOFRAN) IV, sodium chloride, sodium chloride flush Allergies  Allergen Reactions  . Codeine Shortness Of Breath  . Coreg [Carvedilol] Shortness Of Breath and Cough  . Metoprolol Nausea And Vomiting    "Throws up white, foamy liquid"  . Percocet [Oxycodone-Acetaminophen] Nausea And Vomiting  . Hydrocodone Cough    This was in a cough syrup that made her cough WORSE  . Benazepril Cough   Review of Systems  Constitutional: Positive for activity change and fatigue.  All other systems reviewed and are negative.   Physical Exam  Constitutional: She is oriented to person, place, and time. No distress.  HENT:  Head: Normocephalic and atraumatic.  Cardiovascular: Normal rate and regular rhythm.  Pulmonary/Chest: Effort normal. No accessory muscle usage. No respiratory distress.  Abdominal: Soft.  Neurological: She is alert and oriented to person, place, and time.  Skin: Skin is warm and dry.  Psychiatric: She has a normal mood and affect. Her behavior is normal.    Vital Signs: BP (!) 106/46 (BP Location: Left Arm)   Pulse 95   Temp 99.2 F (37.3 C) (Oral)   Resp 18   Ht '4\' 9"'  (1.448 m)   Wt 68.5 kg (151 lb 0.2 oz)   SpO2 97%   BMI 32.68 kg/m  Pain Scale: 0-10 POSS *See Group Information*: 2-Acceptable,Slightly drowsy, easily aroused Pain Score: 0-No pain   SpO2: SpO2: 97 % O2 Device:SpO2: 97 % O2 Flow Rate: .O2 Flow Rate (L/min): 3 L/min  IO: Intake/output summary:   Intake/Output Summary (Last 24 hours) at 04/03/2018 1616 Last data filed at 04/03/2018 1500 Gross per 24 hour  Intake 2442.49 ml  Output 3363 ml  Net -920.51 ml    LBM: Last BM Date: 04/01/18 Baseline Weight: Weight: 70.8 kg (156 lb 1.4 oz) Most recent weight: Weight: 68.5 kg (151 lb 0.2 oz)     Palliative  Assessment/Data: PPS 40%     Time In: 1500 Time Out: 1600 Time Total: 60 minutes Greater than 50%  of this time was spent counseling and coordinating care related to the above assessment and plan.  Juel Burrow, DNP, AGNP-C Palliative Medicine Team [phone]

## 2018-04-03 NOTE — Progress Notes (Addendum)
Advanced Heart Failure Rounding Note  PCP-Cardiologist: Lesleigh NoeHenry W Smith III, MD   Subjective:    03/26/2018 S/P Angioplasty and stenting of left common iliac stenosis with VBX stent.Left to right femorofemoral bypass.   In/out of AF. Remains on high dose IV amio. Required bolus overnight.   CVVHD started 03/29/18. Weight down 26 lbs overall. Remains anuric. Stopping CVVHD today.   Awake and alert this am. Feeling tired but hopeful. Denies orthopnea. Was up in chair for several hours yesterday. No UOP. Has living will in place. States if her kidneys do not recover, she does not " want to live like this".   Coox 56.5% this am on Milrinone 0.25 mcg/kgm/min and NE 8. CVP ~7-8 cm  Objective:   Weight Range: 151 lb 0.2 oz (68.5 kg) Body mass index is 32.68 kg/m.   Vital Signs:   Temp:  [97.9 F (36.6 C)-98.5 F (36.9 C)] 98.5 F (36.9 C) (04/12 0817) Pulse Rate:  [81-147] 93 (04/12 0800) Resp:  [8-25] 20 (04/12 0800) BP: (82-142)/(43-87) 113/51 (04/12 0800) SpO2:  [90 %-100 %] 93 % (04/12 0800) Weight:  [151 lb 0.2 oz (68.5 kg)] 151 lb 0.2 oz (68.5 kg) (04/12 0355) Last BM Date: 04/01/18  Weight change: Filed Weights   04/01/18 0400 04/02/18 0405 04/03/18 0355  Weight: 154 lb 1.6 oz (69.9 kg) 153 lb 7 oz (69.6 kg) 151 lb 0.2 oz (68.5 kg)   Intake/Output:   Intake/Output Summary (Last 24 hours) at 04/03/2018 0847 Last data filed at 04/03/2018 0800 Gross per 24 hour  Intake 2395.73 ml  Output 4560 ml  Net -2164.27 ml    Physical Exam   General: Chronically ill appearing. NAD.  HEENT: Normal Neck: Supple. RIJ trialysis. JVP 7-8 cm Carotids 2+ bilat; no bruits. No thyromegaly or nodule noted. Cor: PMI nondisplaced. RRR, No M/G/R noted Lungs: CTAB, normal effort. Abdomen: Soft, non-tender, non-distended, no HSM. No bruits or masses. +BS  Extremities: No cyanosis, clubbing, or rash. R and LLE no edema.  Neuro: Alert & orientedx3, cranial nerves grossly intact. moves all 4  extremities w/o difficulty. Affect pleasant   Telemetry   NSR 80-90s this am, back in Afib overnight but converted, personally reviewed.   EKG    No new tracings.    Labs    CBC Recent Labs    04/01/18 0427  WBC 8.4  HGB 10.0*  HCT 31.5*  MCV 92.4  PLT 162   Basic Metabolic Panel Recent Labs    84/13/2403/11/11 0418 04/02/18 1625 04/03/18 0353  NA 135 135 134*  K 3.8 3.9 3.9  CL 100* 98* 99*  CO2 24 23 24   GLUCOSE 189* 222* 209*  BUN 5* 5* 6  CREATININE 1.08* 1.24* 1.13*  CALCIUM 8.0* 7.9* 8.0*  MG 2.4  --  2.3  PHOS 2.2* 2.3* 1.8*   Liver Function Tests Recent Labs    04/02/18 1625 04/03/18 0353  ALBUMIN 2.6* 2.3*   No results for input(s): LIPASE, AMYLASE in the last 72 hours. Cardiac Enzymes No results for input(s): CKTOTAL, CKMB, CKMBINDEX, TROPONINI in the last 72 hours.  BNP: BNP (last 3 results) No results for input(s): BNP in the last 8760 hours.  ProBNP (last 3 results) Recent Labs    11/07/17 1214 03/02/18 1724 03/20/18 1534  PROBNP 11,244* 2,115.0* 40,10232,943*   D-Dimer No results for input(s): DDIMER in the last 72 hours. Hemoglobin A1C No results for input(s): HGBA1C in the last 72 hours. Fasting Lipid Panel No  results for input(s): CHOL, HDL, LDLCALC, TRIG, CHOLHDL, LDLDIRECT in the last 72 hours. Thyroid Function Tests No results for input(s): TSH, T4TOTAL, T3FREE, THYROIDAB in the last 72 hours.  Invalid input(s): FREET3 Other results:  Imaging   No results found.  Medications:    Scheduled Medications: . aspirin EC  81 mg Oral Daily  . atorvastatin  40 mg Oral Daily  . Chlorhexidine Gluconate Cloth  6 each Topical Daily  . cycloSPORINE  1 drop Both Eyes BID  . fluticasone  1 spray Each Nare Daily  . insulin aspart  0-15 Units Subcutaneous TID WC  . insulin aspart  0-5 Units Subcutaneous QHS  . insulin glargine  5 Units Subcutaneous QHS  . mouth rinse  15 mL Mouth Rinse BID  . pantoprazole  40 mg Oral Daily  . sodium  chloride flush  10-40 mL Intracatheter Q12H    Infusions: . sodium chloride    . sodium chloride 10 mL/hr at 04/03/18 0600  . amiodarone 60 mg/hr (04/03/18 0600)  . heparin 900 Units/hr (04/03/18 0600)  . milrinone 0.25 mcg/kg/min (04/03/18 0600)  . norepinephrine (LEVOPHED) Adult infusion 8 mcg/min (04/03/18 0600)  . sodium chloride      PRN Medications: sodium chloride, acetaminophen, albuterol, alteplase, bisacodyl, bisacodyl, diazepam, docusate sodium, guaiFENesin-dextromethorphan, heparin, nitroGLYCERIN, ondansetron (ZOFRAN) IV, ondansetron (ZOFRAN) IV, sodium chloride, sodium chloride flush  Patient Profile   Ms Pry is a 72 year old with a history of CAD S/P CABG x 3 in 2002 with stent 2003, chronic systolic heart failure, htn, hyperlipidemia, DM, and CKD.   Admitted with chest pain   Assessment/Plan   1. A/C Systolic Heart Failure -> Cardiogenic shock - EF 20-25% due to ICM  - Initial RHC with markedly elevated biventricular filling pressures and low cardiac output - Making little to no progress. Now with multisystem organ failure requiring dual inotropes.  - On CVVHD  - Coox 56.5% this am on milrinone 0.25 mcg/kg/min and Norepi 8. Can try to wean NE once stable off of CVVHD, but no hurry.  - Doubt she is good candidate for long-term HD. Once she is euvolemic will hold CVVHD and wait for renal recovery. Palliative care to see. Pt understands she does not have additional treatment options if kidneys do not recover.   2. 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 Stop CVVHD today. Watch for renal recovery. Palliative care to see.  - Not candidate for iHD.   3. PAF - Recurrent episode this am. Bolus amio. Continue gtt - Continue heparin. Discussed dosing with PharmD personally.  4. Ischemic RLE - S/p 03/26/2018  POD #7 Angioplasty and stenting of left common iliac stenosis with VBX stent. Left to right  femfemoral bypass - Wound vac in place.  - [facility]VS following. Appreciate their assistance.   5. CAD - Severe native CAD and graft disease on cath 03/24/18. No revasc options - No s/s of ischemia.     6. Post op anemia - Hgb 10.0 04/01/18. Received 2 uPRBCs on 4/8. Denies bleeding.   7. Acute delirium - WBC 8.4 04/01/18.  - Alert and awake alert this am. No s/s of infection.   Patient is critically ill and in multiple system organ failure.  Palliative care to see. Prognosis guarded.  Length of Stay: [address]  04/03/2018, 8:47 AM  Advanced Heart Failure Team Pager [phone] (M-F; 7a - 4p)  Please contact [facility] for night-coverage after hours (4p -7a ) and weekends on [email or web address]  Agree with above.  She remains critically ill. She is anuric. CVVHD stopped this am after discussion with Dr. Allena Katz. Remains on NE at 7  MAPs ok. Co-ox improved. Occasional bouts of AF but in NSR this am on IV amio.   Lying in bed. Tearful  CVP 7-8 RIJ trialysis Cor RRR Lungs CTA Ab soft NT Ext no c/c/e groin incisions ok  CVVD stopped today. Will await to see if she has renal recovery. She understands that if no renal recovery she will need hospice. Palliative care consulted. Continue NE for inotropic support as co-ox quite low on admit and we want to maximize chances of renal recovery. Continue amio and heparin for AF. Discussed dosing with PharmD personally.   CRITICAL CARE Performed by: Arvilla Meres  Total critical care time: 35 minutes  Critical care time was exclusive of separately billable procedures and treating other patients.  Critical care was necessary to treat or prevent imminent or life-threatening deterioration.  Critical care was time spent personally by me (independent of midlevel providers or residents) on the following activities: development of treatment plan with patient and/or surrogate as well as nursing, discussions with consultants, evaluation  of patient's response to treatment, examination of patient, obtaining history from patient or surrogate, ordering and performing treatments and interventions, ordering and review of laboratory studies, ordering and review of radiographic studies, pulse oximetry and re-evaluation of patient's condition.  Arvilla Meres, MD  11:08 AM

## 2018-04-03 NOTE — Progress Notes (Signed)
Patient ID: Hailey Levine, female   DOB: September 15, 1946, 72 y.o.   MRN: [id number] [facility] Progress Note   Assessment/ Plan:   1.  Acute kidney injury on chronic kidney disease stage IV: Suspected to be possibly multifactorial from cardiorenal mechanism with renal hypoperfusion (from CHF exacerbation) and possibly some contrast-induced injury.  She remains anuric and without any evidence of renal recovery-given her net negative fluid balance so far and what appears to be close to her euvolemic state-we will discontinue CRRT at this point.  I had a lengthy discussion with the patient about the implications of this and the likelihood that she would not be able to tolerate conventional outpatient hemodialysis because of her poor ejection fraction and low blood pressures.  If she indeed does not have renal recovery, transition to palliative/comfort care would be the best option. 2.  CHF exacerbation: With history of ischemic cardiomyopathy/systolic heart failure (EF 20-25%) and started on CRRT for volume unloading after resistant to diuretics. 3.  Cardiogenic shock: Hemodynamic improvement noted with ultrafiltration/CRRT-remains on milrinone/levophed. 4.  Right leg ischemia: Postop day #8, status post percutaneous transluminal angioplasty/stenting of left iliac stenosis and left to right femoral-femoral bypass with good Doppler signals and range of motion in both legs noted from vascular surgery assessment. 5.  Anemia: Likely secondary to chronic kidney disease and recent surgical losses-improved status post PRBC transfusion.  6.  Atrial fibrillation with rapid ventricular response: Currently appears to be rate controlled on amiodarone drip.  Subjective:   Had atrial fibrillation with rapid ventricular response overnight, informs me that she was able to sit in recliner for 2 hours yesterday.   Objective:   BP (!) 113/51   Pulse 93   Temp 98.5 F (36.9 C) (Oral)   Resp 20   Ht 4\' 9"   (1.448 m)   Wt 68.5 kg (151 lb 0.2 oz)   SpO2 93%   BMI 32.68 kg/m   Intake/Output Summary (Last 24 hours) at 04/03/2018 0834 Last data filed at 04/03/2018 0800 Gross per 24 hour  Intake 2395.73 ml  Output 4560 ml  Net -2164.27 ml   Weight change: -1.1 kg (-2 lb 6.8 oz)  Physical Exam: Gen: Comfortably resting in bed, awake able to engage in useful conversation. CVS: Pulse regular rhythm, normal rate, S1 and S2 normal Resp: Anteriorly clear to auscultation bilaterally, no rales, no distinct rhonchi Abd: Soft, obese, nontender Ext: No edema over lower extremities  Imaging: No results found.  Labs: BMET Recent Labs  Lab 03/31/18 0419 03/31/18 1600 04/01/18 0427 04/01/18 1527 04/02/18 0418 04/02/18 1625 04/03/18 0353  NA 133* 137 135 130* 135 135 134*  K 4.2 4.4 4.2 3.9 3.8 3.9 3.9  CL 98* 100* 99* 99* 100* 98* 99*  CO2 23 24 24 25 24 23 24   GLUCOSE 169* 183* 187* 253* 189* 222* 209*  BUN 13 9 8 6  5* 5* 6  CREATININE 1.41* 1.21* 1.12* 1.05* 1.08* 1.24* 1.13*  CALCIUM 8.1* 8.1* 8.1* 7.8* 8.0* 7.9* 8.0*  PHOS 2.0* 1.7* 1.4* <1.0* 2.2* 2.3* 1.8*   CBC Recent Labs  Lab 03/29/18 0428 03/30/18 0434 03/30/18 1935 03/31/18 0419 04/01/18 0427  WBC 12.1* 9.1  --  9.2 8.4  HGB 7.8* 7.3* 10.0* 10.1* 10.0*  HCT 23.6* 22.7* 30.6* 31.0* 31.5*  MCV 92.5 90.4  --  90.1 92.4  PLT 159 190  --  171 162    Medications:    . aspirin EC  81 mg Oral Daily  .  atorvastatin  40 mg Oral Daily  . Chlorhexidine Gluconate Cloth  6 each Topical Daily  . cycloSPORINE  1 drop Both Eyes BID  . fluticasone  1 spray Each Nare Daily  . insulin aspart  0-15 Units Subcutaneous TID WC  . insulin aspart  0-5 Units Subcutaneous QHS  . insulin glargine  5 Units Subcutaneous QHS  . mouth rinse  15 mL Mouth Rinse BID  . pantoprazole  40 mg Oral Daily  . sodium chloride flush  10-40 mL Intracatheter Q12H   Zetta BillsJay Mohsen Odenthal, MD 04/03/2018, 8:34 AM

## 2018-04-03 NOTE — Progress Notes (Signed)
   VASCULAR SURGERY ASSESSMENT & PLAN:   8 Days Post-Op s/p: Angioplasty and stenting of the left common iliac artery and a left to right femoral to femoral bypass  Her groin incisions look good.  I would continue dry dressing changes.  Both feet are warm and well-perfused.  Vascular surgery will be available as needed.   SUBJECTIVE:   No complaints this morning.  PHYSICAL EXAM:   Vitals:   04/03/18 0600 04/03/18 0700 04/03/18 0800 04/03/18 0817  BP: (!) 82/69 (!) 121/52 (!) 113/51   Pulse: 92 81 93   Resp: 19 17 20    Temp:    98.5 F (36.9 C)  TempSrc:    Oral  SpO2: 93% 90% 93%   Weight:      Height:       Both groin incisions look good. Both feet are warm and well-perfused.  LABS:   Lab Results  Component Value Date   WBC 8.4 04/01/2018   HGB 10.0 (L) 04/01/2018   HCT 31.5 (L) 04/01/2018   MCV 92.4 04/01/2018   PLT 162 04/01/2018   Lab Results  Component Value Date   CREATININE 1.13 (H) 04/03/2018   Lab Results  Component Value Date   INR 1.17 03/23/2018   CBG (last 3)  Recent Labs    04/02/18 1722 04/02/18 2312 04/03/18 0806  GLUCAP 193* 183* 153*    PROBLEM LIST:    Principal Problem:   NSTEMI (non-ST elevated myocardial infarction) (HCC) Active Problems:   Coronary artery disease involving coronary bypass graft of native heart with angina pectoris (HCC)   Essential hypertension   Hyperlipidemia   Diabetes mellitus type 2 in obese (HCC)   CKD stage 3 due to type 2 diabetes mellitus (HCC)   Acute on chronic combined systolic and diastolic CHF (congestive heart failure) (HCC)   Ischemic leg   CURRENT MEDS:   . aspirin EC  81 mg Oral Daily  . atorvastatin  40 mg Oral Daily  . Chlorhexidine Gluconate Cloth  6 each Topical Daily  . cycloSPORINE  1 drop Both Eyes BID  . fluticasone  1 spray Each Nare Daily  . insulin aspart  0-15 Units Subcutaneous TID WC  . insulin aspart  0-5 Units Subcutaneous QHS  . insulin glargine  5 Units  Subcutaneous QHS  . mouth rinse  15 mL Mouth Rinse BID  . pantoprazole  40 mg Oral Daily  . sodium chloride flush  10-40 mL Intracatheter Q12H    Waverly FerrariChristopher Dickson Beeper: [phone] Office: [phone] 04/03/2018

## 2018-04-04 LAB — CBC
HCT: 27.8 % — ABNORMAL LOW (ref 36.0–46.0)
Hemoglobin: 8.8 g/dL — ABNORMAL LOW (ref 12.0–15.0)
MCH: 28.9 pg (ref 26.0–34.0)
MCHC: 31.7 g/dL (ref 30.0–36.0)
MCV: 91.1 fL (ref 78.0–100.0)
Platelets: 124 10*3/uL — ABNORMAL LOW (ref 150–400)
RBC: 3.05 MIL/uL — ABNORMAL LOW (ref 3.87–5.11)
RDW: 16.8 % — AB (ref 11.5–15.5)
WBC: 8 10*3/uL (ref 4.0–10.5)

## 2018-04-04 LAB — RENAL FUNCTION PANEL
ALBUMIN: 2.4 g/dL — AB (ref 3.5–5.0)
ALBUMIN: 2.4 g/dL — AB (ref 3.5–5.0)
ANION GAP: 12 (ref 5–15)
Anion gap: 13 (ref 5–15)
BUN: 15 mg/dL (ref 6–20)
BUN: 21 mg/dL — AB (ref 6–20)
CALCIUM: 8.1 mg/dL — AB (ref 8.9–10.3)
CALCIUM: 8.1 mg/dL — AB (ref 8.9–10.3)
CHLORIDE: 94 mmol/L — AB (ref 101–111)
CO2: 23 mmol/L (ref 22–32)
CO2: 22 mmol/L (ref 22–32)
CREATININE: 2.37 mg/dL — AB (ref 0.44–1.00)
CREATININE: 3.21 mg/dL — AB (ref 0.44–1.00)
Chloride: 96 mmol/L — ABNORMAL LOW (ref 101–111)
GFR calc Af Amer: 22 mL/min — ABNORMAL LOW (ref >60)
GFR calc non Af Amer: 19 mL/min — ABNORMAL LOW (ref >60)
GFR, EST AFRICAN AMERICAN: 15 mL/min — AB (ref >60)
GFR, EST NON AFRICAN AMERICAN: 13 mL/min — AB (ref >60)
GLUCOSE: 191 mg/dL — AB (ref 65–99)
Glucose, Bld: 185 mg/dL — ABNORMAL HIGH (ref 65–99)
PHOSPHORUS: 1.9 mg/dL — AB (ref 2.5–4.6)
Phosphorus: 2.1 mg/dL — ABNORMAL LOW (ref 2.5–4.6)
Potassium: 3.6 mmol/L (ref 3.5–5.1)
Potassium: 3.8 mmol/L (ref 3.5–5.1)
SODIUM: 131 mmol/L — AB (ref 135–145)
SODIUM: 129 mmol/L — AB (ref 135–145)

## 2018-04-04 LAB — GLUCOSE, CAPILLARY
GLUCOSE-CAPILLARY: 235 mg/dL — AB (ref 65–99)
GLUCOSE-CAPILLARY: 168 mg/dL — AB (ref 65–99)
Glucose-Capillary: 200 mg/dL — ABNORMAL HIGH (ref 65–99)

## 2018-04-04 LAB — COOXEMETRY PANEL
CARBOXYHEMOGLOBIN: 1.8 % — AB (ref 0.5–1.5)
METHEMOGLOBIN: 1.2 % (ref 0.0–1.5)
O2 SAT: 57.6 %
Total hemoglobin: 8.5 g/dL — ABNORMAL LOW (ref 12.0–16.0)

## 2018-04-04 LAB — HEPARIN LEVEL (UNFRACTIONATED): HEPARIN UNFRACTIONATED: 0.5 [IU]/mL (ref 0.30–0.70)

## 2018-04-04 LAB — MAGNESIUM: Magnesium: 2.1 mg/dL (ref 1.7–2.4)

## 2018-04-04 NOTE — Progress Notes (Signed)
Patient ID: DELORIS MOGER, female   DOB: Nov 06, 1946, 72 y.o.   MRN: [id number]     Advanced Heart Failure Rounding Note  PCP-Cardiologist: Lesleigh Noe, MD   Subjective:    03/26/2018 S/P Angioplasty and stenting of left common iliac stenosis with VBX stent.  Left to right femorofemoral bypass.   She is in NSR on amiodarone gtt, decreased to 30 mg/hr.   CVVH stopped 4/12, she remains anuric.   She is now DNR.  Awake and alert today.   Coox 58% this am on Milrinone 0.25 mcg/kgm/min and NE 3. CVP 5 cm  Objective:   Weight Range: 154 lb 8.7 oz (70.1 kg) Body mass index is 33.44 kg/m.   Vital Signs:   Temp:  [97.8 F (36.6 C)-98.9 F (37.2 C)] 98.5 F (36.9 C) (04/13 1100) Pulse Rate:  [80-101] 88 (04/13 0800) Resp:  [12-33] 21 (04/13 0800) BP: (90-132)/(38-78) 120/49 (04/13 0800) SpO2:  [83 %-100 %] 96 % (04/13 0800) Weight:  [154 lb 8.7 oz (70.1 kg)] 154 lb 8.7 oz (70.1 kg) (04/13 0500) Last BM Date: 04/01/18  Weight change: Filed Weights   04/02/18 0405 04/03/18 0355 04/04/18 0500  Weight: 153 lb 7 oz (69.6 kg) 151 lb 0.2 oz (68.5 kg) 154 lb 8.7 oz (70.1 kg)   Intake/Output:   Intake/Output Summary (Last 24 hours) at 04/04/2018 1240 Last data filed at 04/04/2018 1100 Gross per 24 hour  Intake 1962.31 ml  Output 0 ml  Net 1962.31 ml    Physical Exam   General: NAD Neck: No JVD, no thyromegaly or thyroid nodule.  Lungs: Clear to auscultation bilaterally with normal respiratory effort. CV: Nondisplaced PMI.  Heart regular S1/S2, no S3/S4, no murmur.  No peripheral edema.   Abdomen: Soft, nontender, no hepatosplenomegaly, no distention.  Skin: Intact without lesions or rashes.  Neurologic: Alert and oriented x 3.  Psych: Normal affect. Extremities: No clubbing or cyanosis.  HEENT: Normal.   Telemetry   NSR 80s, personally reviewed  Labs    CBC Recent Labs    04/04/18 0424  WBC 8.0  HGB 8.8*  HCT 27.8*  MCV 91.1  PLT 124*   Basic Metabolic  Panel Recent Labs    04/03/18 0353 04/03/18 1708 04/04/18 0424  NA 134* 132* 131*  K 3.9 3.7 3.6  CL 99* 97* 96*  CO2 24 23 23   GLUCOSE 209* 232* 191*  BUN 6 10 15   CREATININE 1.13* 1.72* 2.37*  CALCIUM 8.0* 8.1* 8.1*  MG 2.3  --  2.1  PHOS 1.8* 1.4* 1.9*   Liver Function Tests Recent Labs    04/03/18 1708 04/04/18 0424  ALBUMIN 2.5* 2.4*   No results for input(s): LIPASE, AMYLASE in the last 72 hours. Cardiac Enzymes No results for input(s): CKTOTAL, CKMB, CKMBINDEX, TROPONINI in the last 72 hours.  BNP: BNP (last 3 results) No results for input(s): BNP in the last 8760 hours.  ProBNP (last 3 results) Recent Labs    11/07/17 1214 03/02/18 1724 03/20/18 1534  PROBNP 11,244* 2,115.0* 40,981*   D-Dimer No results for input(s): DDIMER in the last 72 hours. Hemoglobin A1C No results for input(s): HGBA1C in the last 72 hours. Fasting Lipid Panel No results for input(s): CHOL, HDL, LDLCALC, TRIG, CHOLHDL, LDLDIRECT in the last 72 hours. Thyroid Function Tests No results for input(s): TSH, T4TOTAL, T3FREE, THYROIDAB in the last 72 hours.  Invalid input(s): FREET3 Other results:  Imaging   No results found.  Medications:  Scheduled Medications: . aspirin EC  81 mg Oral Daily  . atorvastatin  40 mg Oral Daily  . Chlorhexidine Gluconate Cloth  6 each Topical Daily  . cycloSPORINE  1 drop Both Eyes BID  . fluticasone  1 spray Each Nare Daily  . insulin aspart  0-15 Units Subcutaneous TID WC  . insulin aspart  0-5 Units Subcutaneous QHS  . insulin glargine  5 Units Subcutaneous QHS  . mouth rinse  15 mL Mouth Rinse BID  . pantoprazole  40 mg Oral Daily  . sodium chloride flush  10-40 mL Intracatheter Q12H    Infusions: . sodium chloride    . sodium chloride Stopped (04/03/18 1315)  . amiodarone 30 mg/hr (04/04/18 1200)  . heparin 900 Units/hr (04/04/18 1100)  . milrinone 0.25 mcg/kg/min (04/04/18 1100)  . norepinephrine (LEVOPHED) Adult infusion 3  mcg/min (04/04/18 1100)  . sodium chloride      PRN Medications: sodium chloride, acetaminophen, albuterol, alteplase, bisacodyl, bisacodyl, diazepam, diphenhydrAMINE, docusate sodium, guaiFENesin-dextromethorphan, heparin, nitroGLYCERIN, ondansetron (ZOFRAN) IV, ondansetron (ZOFRAN) IV, sodium chloride, sodium chloride flush  Patient Profile   Ms Catalina GravelBrittain is a 72 year old with a history of CAD S/P CABG x 3 in 2002 with stent 2003, chronic systolic heart failure, htn, hyperlipidemia, DM, and CKD.   Admitted with chest pain   Assessment/Plan   1. A/C Systolic Heart Failure -> Cardiogenic shock: EF 20-25% due to ischemic cardiomyopathy.  Initial RHC with markedly elevated biventricular filling pressures and low cardiac output.  Making little to no progress. Now with multisystem organ failure requiring dual inotropes. Co-ox 58% on milrinone 0.25 and norepinephrine 3.  CVVH stopped yesterday.  CVP 5 but remains anuric.  - Continue milrinone/norepinephrine.  - If no renal recovery, will be poor iHD candidate.  Patient is considering comfort measures in that situation.  2. ARF on CKD IV -> Started on CVVHD 03/29/18: CVVH stopped 4/12.  CVP 5.  Remains anuric.  - Renal following, poor iHD candidate as above.   3. PAF: NSR today.  Continue heparin gtt, decrease amiodarone to 30 mg/hr.  4. Ischemic RLE: S/p 03/26/2018  POD #7 Angioplasty and stenting of left common iliac stenosis with VBX stent. Left to right femoral-femoral bypass - Wound vac in place.  - VVS following. Appreciate their assistance.  5. CAD: Severe native CAD and graft disease on cath 03/24/18. No revascularization options.  No chest pain.  6. Post op anemia: Hgb 8.8. Received 2 uPRBCs on 4/8. Denies bleeding.   Palliative care following, now DNR.  If remains anuric, anticipate comfort measures.   Marca AnconaDalton Bearett Porcaro 04/04/2018 12:49 PM

## 2018-04-04 NOTE — Final Progress Note (Signed)
Pt bladder scan yielded 54cc of urine in her bladder. Will continue to check every 4-6 hours.

## 2018-04-04 NOTE — Progress Notes (Signed)
ANTICOAGULATION CONSULT NOTE  Pharmacy Consult for Heparin  Indication: atrial fibrillation  Allergies  Allergen Reactions  . Codeine Shortness Of Breath  . Coreg [Carvedilol] Shortness Of Breath and Cough  . Metoprolol Nausea And Vomiting    "Throws up white, foamy liquid"  . Percocet [Oxycodone-Acetaminophen] Nausea And Vomiting  . Hydrocodone Cough    This was in a cough syrup that made her cough WORSE  . Benazepril Cough    Patient Measurements: Height: 4\' 9"  (144.8 cm) Weight: 154 lb 8.7 oz (70.1 kg) IBW/kg (Calculated) : 38.6 Heparin dose wt 60kg  Vital Signs: Temp: 98.5 F (36.9 C) (04/13 1100) Temp Source: Oral (04/13 1100) BP: 120/49 (04/13 0800) Pulse Rate: 88 (04/13 0800)  Labs: Recent Labs    04/02/18 0418  04/03/18 0353 04/03/18 1708 04/04/18 0424  HGB  --   --   --   --  8.8*  HCT  --   --   --   --  27.8*  PLT  --   --   --   --  124*  APTT 106*  --  108*  --   --   HEPARINUNFRC 0.43  --  0.54  --  0.50  CREATININE 1.08*   < > 1.13* 1.72* 2.37*   < > = values in this interval not displayed.    Assessment: 71yof admitted with HF, S/p fem pop bypass.  Heparin drip started for anticoagulation for atrial fibrillation. Patient continues to have episodes of AF. Amiodarone continues.  Heparin level this morning within goal range at 0.5. Hemoglobin down to 8.8 and platelets also trending down to 124. No plans to restart CRRT.    Goal of Therapy:  Heparin level 0.3-0.7 units/ml Monitor platelets by anticoagulation protocol: Yes  Plan:  Continue heparin at 900 units/ hour Daily HL/ CBC  F/u plans for longterm anticoagulation Monitor for S/Sx for bleeding  Sheppard CoilFrank Wilson PharmD., BCPS Clinical Pharmacist 04/04/2018 1:09 PM

## 2018-04-04 NOTE — Progress Notes (Signed)
Dr Means(cardiology fellow) informed that pt has been in NSR all day yet now is afib HR 110-130's. Pt asymptomatic. Pt on amiodarone drip at 30mg /hr. No new orders received. MD states to continue drip at current rate and call back if HR sustained >140. Will continue to monitor pt closely.

## 2018-04-04 NOTE — Progress Notes (Signed)
Patient ID: Hailey Levine, female   DOB: 1946/04/28, 72 y.o.   MRN: [id number] [facility] Progress Note   Assessment/ Plan:   1.  Acute kidney injury on chronic kidney disease stage IV: Suspected to be possibly multifactorial from cardiorenal mechanism with renal hypoperfusion (from CHF exacerbation) and possibly some contrast-induced injury.  CRRT discontinued yesterday after achieving ultrafiltration goals-she has remained anuric overnight with corresponding rise of BUN/creatinine.  No acute electrolyte abnormality or acute indications for renal replacement therapy-lengthy discussion as well today about palliative care option if renal function does not recover as her cardiac status will not allow chronic intermittent hemodialysis. 2.  CHF exacerbation: With history of ischemic cardiomyopathy/systolic heart failure (EF 20-25%) and successful volume unloading-with CRRT if she does have urine output, we will augment this with furosemide. 3.  Cardiogenic shock: Successful volume unloading with CRRT, remains on Levophed and milrinone. 4.  Right leg ischemia: Postop day #9, status post percutaneous transluminal angioplasty/stenting of left iliac stenosis and left to right femoral-femoral bypass with continued management by vascular surgery. 5.  Anemia: Likely secondary to chronic kidney disease and recent surgical losses-improved status post PRBC transfusion.  6.  Atrial fibrillation with rapid ventricular response: Currently appears to be rate controlled on amiodarone drip.  Subjective:   Reports anxiety and insomnia overnight concerned about prognosis.   Objective:   BP (!) 114/50   Pulse 86   Temp 98.3 F (36.8 C) (Axillary)   Resp 13   Ht 4\' 9"  (1.448 m)   Wt 70.1 kg (154 lb 8.7 oz)   SpO2 96%   BMI 33.44 kg/m   Intake/Output Summary (Last 24 hours) at 04/04/2018 0829 Last data filed at 04/04/2018 0700 Gross per 24 hour  Intake 2019.11 ml  Output 181 ml  Net 1838.11  ml   Weight change: 1.6 kg (3 lb 8.4 oz)  Physical Exam: Gen: Comfortably resting in recliner. CVS: Pulse regular rhythm, normal rate, S1 and S2 normal Resp: Anteriorly clear to auscultation bilaterally, no rales, no distinct rhonchi Abd: Soft, obese, nontender Ext: No edema over lower extremities  Imaging: No results found.  Labs: BMET Recent Labs  Lab 04/01/18 0427 04/01/18 1527 04/02/18 0418 04/02/18 1625 04/03/18 0353 04/03/18 1708 04/04/18 0424  NA 135 130* 135 135 134* 132* 131*  K 4.2 3.9 3.8 3.9 3.9 3.7 3.6  CL 99* 99* 100* 98* 99* 97* 96*  CO2 24 25 24 23 24 23 23   GLUCOSE 187* 253* 189* 222* 209* 232* 191*  BUN 8 6 5* 5* 6 10 15   CREATININE 1.12* 1.05* 1.08* 1.24* 1.13* 1.72* 2.37*  CALCIUM 8.1* 7.8* 8.0* 7.9* 8.0* 8.1* 8.1*  PHOS 1.4* <1.0* 2.2* 2.3* 1.8* 1.4* 1.9*   CBC Recent Labs  Lab 03/30/18 0434 03/30/18 1935 03/31/18 0419 04/01/18 0427 04/04/18 0424  WBC 9.1  --  9.2 8.4 8.0  HGB 7.3* 10.0* 10.1* 10.0* 8.8*  HCT 22.7* 30.6* 31.0* 31.5* 27.8*  MCV 90.4  --  90.1 92.4 91.1  PLT 190  --  171 162 124*    Medications:    . aspirin EC  81 mg Oral Daily  . atorvastatin  40 mg Oral Daily  . Chlorhexidine Gluconate Cloth  6 each Topical Daily  . cycloSPORINE  1 drop Both Eyes BID  . fluticasone  1 spray Each Nare Daily  . insulin aspart  0-15 Units Subcutaneous TID WC  . insulin aspart  0-5 Units Subcutaneous QHS  . insulin glargine  5 Units Subcutaneous QHS  . mouth rinse  15 mL Mouth Rinse BID  . pantoprazole  40 mg Oral Daily  . sodium chloride flush  10-40 mL Intracatheter Q12H   Hailey BillsJay Manpreet Kemmer, MD 04/04/2018, 8:29 AM

## 2018-04-04 NOTE — Evaluation (Signed)
Physical Therapy Evaluation Patient Details Name: Hailey Levine MRN: [id number] DOB: November 21, 1946 Today's Date: 04/04/2018   History of Present Illness  Pt is a 72 y/o female with a history of CAD s/p MI and 3v CABG in 2002 with subsequent stenting 2003, HFrEF (EF 20%), HTN, HLD, DM, and CKD who presents after an episode of chest pain. Pt found to have NSTEMI. Pt is s/p angioplasty of the left common iliac artery with a 4 mm x 6 cm balloon and angioplasty and stenting of left common iliac artery with a VBX 6 mm x 59 mm covered stent, angioplasty and stenting of left common iliac stenosis withVBX stent, left to right femorofemoral bypass on 4/4. Of note, pt required CRRT from 4/8 to 4/12.    Clinical Impression  Pt presented supine in bed with HOB elevated, awake and willing to participate in therapy session. Prior to admission, pt reported that she was independent with all functional mobility and ADLs. Pt currently limited secondary to generalized weakness and poor endurance. She performed bed mobility with min-mod A, sit<>stand with min A and took several side steps at EOB with 1HHA and min A for stability. All VSS throughout with pt on RA. Pt would continue to benefit from skilled physical therapy services at this time while admitted and after d/c to address the below listed limitations in order to improve overall safety and independence with functional mobility.     Follow Up Recommendations SNF    Equipment Recommendations  None recommended by PT    Recommendations for Other Services       Precautions / Restrictions Precautions Precautions: Fall Restrictions Weight Bearing Restrictions: No      Mobility  Bed Mobility Overal bed mobility: Needs Assistance Bed Mobility: Supine to Sit;Sit to Supine     Supine to sit: Mod assist Sit to supine: Min assist   General bed mobility comments: increased time and effort, use of bed rails, assist to move bilateral LEs off of and onto  bed, assist to elevate trunk and use of bed pads to position pt's hips at EOB  Transfers Overall transfer level: Needs assistance Equipment used: 1 person hand held assist Transfers: Sit to/from Stand Sit to Stand: Min assist         General transfer comment: min A for stability with transitional movement  Ambulation/Gait             General Gait Details: pt able to take 5-6 side steps at EOB towards HOB with 1HHA and min A for stability  Stairs            Wheelchair Mobility    Modified Rankin (Stroke Patients Only)       Balance Overall balance assessment: Needs assistance Sitting-balance support: Feet supported Sitting balance-Leahy Scale: Fair     Standing balance support: During functional activity;Single extremity supported Standing balance-Leahy Scale: Poor                               Pertinent Vitals/Pain Pain Assessment: No/denies pain    Home Living Family/patient expects to be discharged to:: Private residence Living Arrangements: Alone Available Help at Discharge: Family;Friend(s);Available PRN/intermittently Type of Home: House Home Access: Stairs to enter Entrance Stairs-Rails: None Entrance Stairs-Number of Steps: 2 Home Layout: Two level Home Equipment: Shower seat      Prior Function Level of Independence: Independent  Hand Dominance        Extremity/Trunk Assessment   Upper Extremity Assessment Upper Extremity Assessment: Generalized weakness    Lower Extremity Assessment Lower Extremity Assessment: Generalized weakness       Communication   Communication: No difficulties  Cognition Arousal/Alertness: Awake/alert Behavior During Therapy: WFL for tasks assessed/performed Overall Cognitive Status: Within Functional Limits for tasks assessed                                        General Comments      Exercises     Assessment/Plan    PT Assessment Patient  needs continued PT services  PT Problem List Decreased strength;Decreased activity tolerance;Decreased balance;Decreased mobility;Decreased coordination;Decreased knowledge of use of DME;Decreased safety awareness;Decreased knowledge of precautions;Cardiopulmonary status limiting activity       PT Treatment Interventions DME instruction;Gait training;Stair training;Functional mobility training;Therapeutic activities;Therapeutic exercise;Balance training;Neuromuscular re-education;Patient/family education    PT Goals (Current goals can be found in the Care Plan section)  Acute Rehab PT Goals Patient Stated Goal: to return to independence PT Goal Formulation: With patient Time For Goal Achievement: 04/18/18 Potential to Achieve Goals: Fair    Frequency Min 3X/week   Barriers to discharge        Co-evaluation               AM-PAC PT "6 Clicks" Daily Activity  Outcome Measure Difficulty turning over in bed (including adjusting bedclothes, sheets and blankets)?: Unable Difficulty moving from lying on back to sitting on the side of the bed? : Unable Difficulty sitting down on and standing up from a chair with arms (e.g., wheelchair, bedside commode, etc,.)?: Unable Help needed moving to and from a bed to chair (including a wheelchair)?: A Little Help needed walking in hospital room?: A Little Help needed climbing 3-5 steps with a railing? : A Lot 6 Click Score: 11    End of Session Equipment Utilized During Treatment: Gait belt Activity Tolerance: Patient limited by fatigue Patient left: in bed;with call bell/phone within reach;with family/visitor present Nurse Communication: Mobility status PT Visit Diagnosis: Other abnormalities of gait and mobility (R26.89);Muscle weakness (generalized) (M62.81)    Time: [id number] PT Time Calculation (min) (ACUTE ONLY): 38 min   Charges:   PT Evaluation $PT Eval Moderate Complexity: 1 Mod PT Treatments $Therapeutic Activity: 8-22  mins   PT G Codes:        Rainbow LakesJennifer Deklyn Trachtenberg, PT, DPT [phone]   Alessandra BevelsJennifer M Gal[place] Logie 04/04/2018, 5:19 PM

## 2018-04-05 LAB — CBC
HCT: 27.7 % — ABNORMAL LOW (ref 36.0–46.0)
Hemoglobin: 8.9 g/dL — ABNORMAL LOW (ref 12.0–15.0)
MCH: 29.4 pg (ref 26.0–34.0)
MCHC: 32.1 g/dL (ref 30.0–36.0)
MCV: 91.4 fL (ref 78.0–100.0)
PLATELETS: 127 10*3/uL — AB (ref 150–400)
RBC: 3.03 MIL/uL — AB (ref 3.87–5.11)
RDW: 17.1 % — ABNORMAL HIGH (ref 11.5–15.5)
WBC: 7.6 10*3/uL (ref 4.0–10.5)

## 2018-04-05 LAB — RENAL FUNCTION PANEL
ANION GAP: 14 (ref 5–15)
Albumin: 2.3 g/dL — ABNORMAL LOW (ref 3.5–5.0)
Albumin: 2.3 g/dL — ABNORMAL LOW (ref 3.5–5.0)
Anion gap: 13 (ref 5–15)
BUN: 25 mg/dL — ABNORMAL HIGH (ref 6–20)
BUN: 31 mg/dL — AB (ref 6–20)
CALCIUM: 8.4 mg/dL — AB (ref 8.9–10.3)
CHLORIDE: 92 mmol/L — AB (ref 101–111)
CO2: 22 mmol/L (ref 22–32)
CO2: 21 mmol/L — AB (ref 22–32)
CREATININE: 4.38 mg/dL — AB (ref 0.44–1.00)
Calcium: 8.1 mg/dL — ABNORMAL LOW (ref 8.9–10.3)
Chloride: 93 mmol/L — ABNORMAL LOW (ref 101–111)
Creatinine, Ser: 3.85 mg/dL — ABNORMAL HIGH (ref 0.44–1.00)
GFR calc Af Amer: 12 mL/min — ABNORMAL LOW (ref >60)
GFR calc Af Amer: 11 mL/min — ABNORMAL LOW (ref >60)
GFR calc non Af Amer: 9 mL/min — ABNORMAL LOW (ref >60)
GFR, EST NON AFRICAN AMERICAN: 11 mL/min — AB (ref >60)
GLUCOSE: 172 mg/dL — AB (ref 65–99)
Glucose, Bld: 199 mg/dL — ABNORMAL HIGH (ref 65–99)
POTASSIUM: 4.1 mmol/L (ref 3.5–5.1)
Phosphorus: 2.7 mg/dL (ref 2.5–4.6)
Phosphorus: 2.6 mg/dL (ref 2.5–4.6)
Potassium: 4 mmol/L (ref 3.5–5.1)
SODIUM: 129 mmol/L — AB (ref 135–145)
Sodium: 126 mmol/L — ABNORMAL LOW (ref 135–145)

## 2018-04-05 LAB — COOXEMETRY PANEL
Carboxyhemoglobin: 1.3 % (ref 0.5–1.5)
Methemoglobin: 1.4 % (ref 0.0–1.5)
O2 SAT: 51.4 %
TOTAL HEMOGLOBIN: 9.2 g/dL — AB (ref 12.0–16.0)

## 2018-04-05 LAB — HEPARIN LEVEL (UNFRACTIONATED): Heparin Unfractionated: 0.55 IU/mL (ref 0.30–0.70)

## 2018-04-05 LAB — MAGNESIUM: MAGNESIUM: 2.1 mg/dL (ref 1.7–2.4)

## 2018-04-05 LAB — GLUCOSE, CAPILLARY
GLUCOSE-CAPILLARY: 144 mg/dL — AB (ref 65–99)
Glucose-Capillary: 187 mg/dL — ABNORMAL HIGH (ref 65–99)
Glucose-Capillary: 222 mg/dL — ABNORMAL HIGH (ref 65–99)

## 2018-04-05 NOTE — Progress Notes (Signed)
Pt is very anxious, restless, and unable to fall asleep and rest. Pt states that she is worried that she has not been able to urinate because that means her kidneys are not working yet. Anxiety medication given and emotional support given to pt. Pt is hopefull but she was eager to talk about palliative care as well. Pt once again states that she does not want more aggressive treatments to maintain life and that she would like to go home and spend time with family.

## 2018-04-05 NOTE — Progress Notes (Signed)
Patient is very anxious tonight. She complains about back pain. She continues to express her fears about lack of urine output. Bladder scan showed only 48 cc. Patient requested some valium and benadryl for tonight. Valium 5 mg was given to help with pain and anxiety. Benadryl was withhold due to patient's renal function. Continue to monitor the patient.

## 2018-04-05 NOTE — Progress Notes (Signed)
ANTICOAGULATION CONSULT NOTE  Pharmacy Consult for Heparin  Indication: atrial fibrillation  Allergies  Allergen Reactions  . Codeine Shortness Of Breath  . Coreg [Carvedilol] Shortness Of Breath and Cough  . Metoprolol Nausea And Vomiting    "Throws up white, foamy liquid"  . Percocet [Oxycodone-Acetaminophen] Nausea And Vomiting  . Hydrocodone Cough    This was in a cough syrup that made her cough WORSE  . Benazepril Cough    Patient Measurements: Height: 4\' 9"  (144.8 cm) Weight: 158 lb 8.2 oz (71.9 kg) IBW/kg (Calculated) : 38.6 Heparin dose wt 60kg  Vital Signs: Temp: 97.8 F (36.6 C) (04/14 0700) Temp Source: Oral (04/14 0700) BP: 118/58 (04/14 0900) Pulse Rate: 120 (04/14 0900)  Labs: Recent Labs    04/03/18 0353  04/04/18 0424 04/04/18 1842 04/05/18 0544  HGB  --   --  8.8*  --  8.9*  HCT  --   --  27.8*  --  27.7*  PLT  --   --  124*  --  127*  APTT 108*  --   --   --   --   HEPARINUNFRC 0.54  --  0.50  --  0.55  CREATININE 1.13*   < > 2.37* 3.21* 3.85*   < > = values in this interval not displayed.    Assessment: 71yof admitted with HF, S/p fem pop bypass.  Heparin drip started for anticoagulation for atrial fibrillation. Patient continues to have episodes of AF. IV amiodarone continues.  Heparin level this morning within goal range at 0.5. Hemoglobin stable in upper 8s and platelets stable at 127.  No plans to restart CRRT.    Goal of Therapy:  Heparin level 0.3-0.7 units/ml Monitor platelets by anticoagulation protocol: Yes  Plan:  Continue heparin at 900 units/ hour Daily HL/ CBC  F/u plans for longterm anticoagulation Monitor for S/Sx for bleeding  Sheppard CoilFrank Wilson PharmD., BCPS Clinical Pharmacist 04/05/2018 10:14 AM

## 2018-04-05 NOTE — Progress Notes (Signed)
Patient ID: Hailey Levine, female   DOB: 10/20/1946, 72 y.o.   MRN: [id number]     Advanced Heart Failure Rounding Note  PCP-Cardiologist: Lesleigh NoeHenry W Smith III, MD   Subjective:    03/26/2018 S/P Angioplasty and stenting of left common iliac stenosis with VBX stent.  Left to right femorofemoral bypass.   She went back into atrial fibrillation last night, HR now 120s-130s.  She does not feel palpitations.   CVVH stopped 4/12, she remains anuric.   She is now DNR.  Awake and alert today.   Coox 51% this am on Milrinone 0.25 mcg/kgm/min, off norepinephrine. CVP up to 12 and weight up.   Objective:   Weight Range: 158 lb 8.2 oz (71.9 kg) Body mass index is 34.3 kg/m.   Vital Signs:   Temp:  [97.7 F (36.5 C)-98.5 F (36.9 C)] 97.8 F (36.6 C) (04/14 0700) Pulse Rate:  [91-140] 120 (04/14 0900) Resp:  [12-24] 23 (04/14 0900) BP: (81-133)/(36-105) 118/58 (04/14 0900) SpO2:  [90 %-100 %] 94 % (04/14 0900) Weight:  [158 lb 8.2 oz (71.9 kg)] 158 lb 8.2 oz (71.9 kg) (04/14 0600) Last BM Date: 04/01/18  Weight change: Filed Weights   04/03/18 0355 04/04/18 0500 04/05/18 0600  Weight: 151 lb 0.2 oz (68.5 kg) 154 lb 8.7 oz (70.1 kg) 158 lb 8.2 oz (71.9 kg)   Intake/Output:   Intake/Output Summary (Last 24 hours) at 04/05/2018 1120 Last data filed at 04/05/2018 1000 Gross per 24 hour  Intake 1111.4 ml  Output 1 ml  Net 1110.4 ml    Physical Exam   General: NAD Neck: JVP 10 cm, no thyromegaly or thyroid nodule.  Lungs: Clear to auscultation bilaterally with normal respiratory effort. CV: Nondisplaced PMI.  Heart mildly tachy, irregular S1/S2, no S3/S4, no murmur.  No peripheral edema.   Abdomen: Soft, nontender, no hepatosplenomegaly, no distention.  Skin: Intact without lesions or rashes.  Neurologic: Alert and oriented x 3.  Psych: Normal affect. Extremities: No clubbing or cyanosis.  HEENT: Normal.    Telemetry   afib 120s, personally reviewed.   Labs     CBC Recent Labs    04/04/18 0424 04/05/18 0544  WBC 8.0 7.6  HGB 8.8* 8.9*  HCT 27.8* 27.7*  MCV 91.1 91.4  PLT 124* 127*   Basic Metabolic Panel Recent Labs    82/95/6204/13/19 0424 04/04/18 1842 04/05/18 0544  NA 131* 129* 129*  K 3.6 3.8 4.0  CL 96* 94* 93*  CO2 23 22 22   GLUCOSE 191* 185* 199*  BUN 15 21* 25*  CREATININE 2.37* 3.21* 3.85*  CALCIUM 8.1* 8.1* 8.4*  MG 2.1  --  2.1  PHOS 1.9* 2.1* 2.7   Liver Function Tests Recent Labs    04/04/18 1842 04/05/18 0544  ALBUMIN 2.4* 2.3*   No results for input(s): LIPASE, AMYLASE in the last 72 hours. Cardiac Enzymes No results for input(s): CKTOTAL, CKMB, CKMBINDEX, TROPONINI in the last 72 hours.  BNP: BNP (last 3 results) No results for input(s): BNP in the last 8760 hours.  ProBNP (last 3 results) Recent Labs    11/07/17 1214 03/02/18 1724 03/20/18 1534  PROBNP 11,244* 2,115.0* 13,08632,943*   D-Dimer No results for input(s): DDIMER in the last 72 hours. Hemoglobin A1C No results for input(s): HGBA1C in the last 72 hours. Fasting Lipid Panel No results for input(s): CHOL, HDL, LDLCALC, TRIG, CHOLHDL, LDLDIRECT in the last 72 hours. Thyroid Function Tests No results for input(s): TSH, T4TOTAL, T3FREE,  THYROIDAB in the last 72 hours.  Invalid input(s): FREET3 Other results:  Imaging   No results found.  Medications:    Scheduled Medications: . aspirin EC  81 mg Oral Daily  . atorvastatin  40 mg Oral Daily  . Chlorhexidine Gluconate Cloth  6 each Topical Daily  . cycloSPORINE  1 drop Both Eyes BID  . fluticasone  1 spray Each Nare Daily  . insulin aspart  0-15 Units Subcutaneous TID WC  . insulin aspart  0-5 Units Subcutaneous QHS  . insulin glargine  5 Units Subcutaneous QHS  . mouth rinse  15 mL Mouth Rinse BID  . pantoprazole  40 mg Oral Daily  . sodium chloride flush  10-40 mL Intracatheter Q12H    Infusions: . sodium chloride    . sodium chloride Stopped (04/03/18 1315)  . amiodarone 30  mg/hr (04/05/18 1000)  . heparin 900 Units/hr (04/05/18 1000)  . milrinone 0.25 mcg/kg/min (04/05/18 1000)  . norepinephrine (LEVOPHED) Adult infusion Stopped (04/05/18 1000)  . sodium chloride      PRN Medications: sodium chloride, acetaminophen, albuterol, alteplase, bisacodyl, bisacodyl, diazepam, diphenhydrAMINE, docusate sodium, guaiFENesin-dextromethorphan, heparin, nitroGLYCERIN, ondansetron (ZOFRAN) IV, ondansetron (ZOFRAN) IV, sodium chloride, sodium chloride flush  Patient Profile   Ms Sakamoto is a 72 year old with a history of CAD S/P CABG x 3 in 2002 with stent 2003, chronic systolic heart failure, htn, hyperlipidemia, DM, and CKD.   Admitted with chest pain   Assessment/Plan   1. A/C Systolic Heart Failure -> Cardiogenic shock: EF 20-25% due to ischemic cardiomyopathy.  Initial RHC with markedly elevated biventricular filling pressures and low cardiac output. Now with multisystem organ failure.  Co-ox 51% on milrinone 0.25, norepinephrine off  CVVH stopped 4/12.  CVP rising to 12, weight up.  She remains anuric.  - Continue milrinone for now.  - I suspect she is not going to have renal recovery.  She has been working with palliative care and is contemplating hospice care.  She is not a good candidate for iHD.  Will ask palliative care to see her today.  2. ARF on CKD IV -> Started on CVVHD 03/29/18: CVVH stopped 4/12.  CVP 12 and weight rising.  Remains anuric.  - Renal following, poor iHD candidate as above.   3. PAF: Back in afib today with RVR.  Increase amiodarone gtt to 60 mg/hr for now.  4. Ischemic RLE: S/p 03/26/2018  POD #7 Angioplasty and stenting of left common iliac stenosis with VBX stent. Left to right femoral-femoral bypass.  5. CAD: Severe native CAD and graft disease on cath 03/24/18. No revascularization options.  No chest pain.  6. Post op anemia: Hgb 8.9. Received 2 uPRBCs on 4/8. Denies bleeding.   Palliative care following, now DNR.  She remains anuric, move  to comfort care is reasonable at this point, she is contemplating this but wants to wait one more day to see if she has any significant UOP.   Marca Ancona 04/05/2018 11:20 AM

## 2018-04-05 NOTE — Progress Notes (Signed)
Patient ID: Hailey JonesMarilyn P Levine, female   DOB: 11/07/1946, 72 y.o.   MRN: [id number] [facility] Progress Note   Assessment/ Plan:   1.  Acute kidney injury on chronic kidney disease stage IV: Suspected to be acute on chronic cardiorenal/contrast-induced nephropathy.  With transient need for CRRT/ultrafiltration after failing intravenous diuretics-CRRT discontinued 48 hours ago after achieving her dry weight/CVP goal.  She does not have any urine output and BUN/creatinine rising as expected off CRRT-palliative care discussions underway as she is not a candidate for chronic outpatient intermittent hemodialysis with poor EF. 2.  CHF exacerbation: With history of ischemic cardiomyopathy/systolic heart failure (EF 20-25%) and successful volume unloading.  Continue to follow/monitor urine output at this time. 3.  Cardiogenic shock: Successful volume unloading with CRRT, remains on Levophed and milrinone. 4.  Right leg ischemia: Postop day #10, status post percutaneous transluminal angioplasty/stenting of left iliac stenosis and left to right femoral-femoral bypass with continued management by vascular surgery. 5.  Anemia: Likely secondary to chronic kidney disease and recent surgical losses- stable s/p PRBC transfusion.  6.  Atrial fibrillation with rapid ventricular response: Currently appears to be rate controlled on amiodarone drip.  Subjective:   Reports anxiety about prognosis and disappointed that she has not urinated.   Objective:   BP 115/66   Pulse (!) 118   Temp 97.8 F (36.6 C) (Oral)   Resp 17   Ht 4\' 9"  (1.448 m)   Wt 71.9 kg (158 lb 8.2 oz)   SpO2 95%   BMI 34.30 kg/m   Intake/Output Summary (Last 24 hours) at 04/05/2018 0903 Last data filed at 04/05/2018 0700 Gross per 24 hour  Intake 1114.4 ml  Output 0 ml  Net 1114.4 ml   Weight change: 1.8 kg (3 lb 15.5 oz)  Physical Exam: Gen: sitting on BSC CVS: Pulse regular rhythm, normal rate, S1 and S2 normal Resp:  Anteriorly clear to auscultation bilaterally, no rales, no distinct rhonchi Abd: Soft, obese, nontender Ext: Trace edema over lower extremities  Imaging: No results found.  Labs: BMET Recent Labs  Lab 04/02/18 0418 04/02/18 1625 04/03/18 0353 04/03/18 1708 04/04/18 0424 04/04/18 1842 04/05/18 0544  NA 135 135 134* 132* 131* 129* 129*  K 3.8 3.9 3.9 3.7 3.6 3.8 4.0  CL 100* 98* 99* 97* 96* 94* 93*  CO2 24 23 24 23 23 22 22   GLUCOSE 189* 222* 209* 232* 191* 185* 199*  BUN 5* 5* 6 10 15  21* 25*  CREATININE 1.08* 1.24* 1.13* 1.72* 2.37* 3.21* 3.85*  CALCIUM 8.0* 7.9* 8.0* 8.1* 8.1* 8.1* 8.4*  PHOS 2.2* 2.3* 1.8* 1.4* 1.9* 2.1* 2.7   CBC Recent Labs  Lab 03/31/18 0419 04/01/18 0427 04/04/18 0424 04/05/18 0544  WBC 9.2 8.4 8.0 7.6  HGB 10.1* 10.0* 8.8* 8.9*  HCT 31.0* 31.5* 27.8* 27.7*  MCV 90.1 92.4 91.1 91.4  PLT 171 162 124* 127*    Medications:    . aspirin EC  81 mg Oral Daily  . atorvastatin  40 mg Oral Daily  . Chlorhexidine Gluconate Cloth  6 each Topical Daily  . cycloSPORINE  1 drop Both Eyes BID  . fluticasone  1 spray Each Nare Daily  . insulin aspart  0-15 Units Subcutaneous TID WC  . insulin aspart  0-5 Units Subcutaneous QHS  . insulin glargine  5 Units Subcutaneous QHS  . mouth rinse  15 mL Mouth Rinse BID  . pantoprazole  40 mg Oral Daily  . sodium chloride  flush  10-40 mL Intracatheter Q12H   Zetta Bills, MD 04/05/2018, 9:03 AM

## 2018-04-06 DIAGNOSIS — I214 Non-ST elevation (NSTEMI) myocardial infarction: Principal | ICD-10-CM

## 2018-04-06 DIAGNOSIS — Z515 Encounter for palliative care: Secondary | ICD-10-CM

## 2018-04-06 DIAGNOSIS — E1122 Type 2 diabetes mellitus with diabetic chronic kidney disease: Secondary | ICD-10-CM

## 2018-04-06 DIAGNOSIS — Z7189 Other specified counseling: Secondary | ICD-10-CM

## 2018-04-06 DIAGNOSIS — N183 Chronic kidney disease, stage 3 (moderate): Secondary | ICD-10-CM

## 2018-04-06 DIAGNOSIS — I5043 Acute on chronic combined systolic (congestive) and diastolic (congestive) heart failure: Secondary | ICD-10-CM

## 2018-04-06 LAB — RENAL FUNCTION PANEL
ALBUMIN: 2.4 g/dL — AB (ref 3.5–5.0)
Anion gap: 14 (ref 5–15)
BUN: 34 mg/dL — AB (ref 6–20)
CO2: 21 mmol/L — ABNORMAL LOW (ref 22–32)
Calcium: 8.2 mg/dL — ABNORMAL LOW (ref 8.9–10.3)
Chloride: 91 mmol/L — ABNORMAL LOW (ref 101–111)
Creatinine, Ser: 4.9 mg/dL — ABNORMAL HIGH (ref 0.44–1.00)
GFR calc Af Amer: 9 mL/min — ABNORMAL LOW (ref >60)
GFR calc non Af Amer: 8 mL/min — ABNORMAL LOW (ref >60)
GLUCOSE: 170 mg/dL — AB (ref 65–99)
PHOSPHORUS: 3.4 mg/dL (ref 2.5–4.6)
POTASSIUM: 4.3 mmol/L (ref 3.5–5.1)
SODIUM: 126 mmol/L — AB (ref 135–145)

## 2018-04-06 LAB — GLUCOSE, CAPILLARY
GLUCOSE-CAPILLARY: 237 mg/dL — AB (ref 65–99)
GLUCOSE-CAPILLARY: 189 mg/dL — AB (ref 65–99)
Glucose-Capillary: 206 mg/dL — ABNORMAL HIGH (ref 65–99)
Glucose-Capillary: 146 mg/dL — ABNORMAL HIGH (ref 65–99)

## 2018-04-06 LAB — MAGNESIUM: Magnesium: 1.9 mg/dL (ref 1.7–2.4)

## 2018-04-06 LAB — CBC
HEMATOCRIT: 23.8 % — AB (ref 36.0–46.0)
HEMOGLOBIN: 7.8 g/dL — AB (ref 12.0–15.0)
MCH: 29.5 pg (ref 26.0–34.0)
MCHC: 32.8 g/dL (ref 30.0–36.0)
MCV: 90.2 fL (ref 78.0–100.0)
Platelets: 137 10*3/uL — ABNORMAL LOW (ref 150–400)
RBC: 2.64 MIL/uL — ABNORMAL LOW (ref 3.87–5.11)
RDW: 17 % — ABNORMAL HIGH (ref 11.5–15.5)
WBC: 8 10*3/uL (ref 4.0–10.5)

## 2018-04-06 LAB — HEPARIN LEVEL (UNFRACTIONATED): Heparin Unfractionated: 0.51 IU/mL (ref 0.30–0.70)

## 2018-04-06 LAB — COOXEMETRY PANEL
CARBOXYHEMOGLOBIN: 1.5 % (ref 0.5–1.5)
METHEMOGLOBIN: 1.6 % — AB (ref 0.0–1.5)
O2 Saturation: 74.3 %
Total hemoglobin: 7.9 g/dL — ABNORMAL LOW (ref 12.0–16.0)

## 2018-04-06 MED ORDER — GLYCOPYRROLATE 1 MG PO TABS
1.0000 mg | ORAL_TABLET | ORAL | Status: DC | PRN
  Filled 2018-04-06: qty 1

## 2018-04-06 MED ORDER — POLYVINYL ALCOHOL 1.4 % OP SOLN
1.0000 [drp] | Freq: Four times a day (QID) | OPHTHALMIC | Status: DC | PRN
  Filled 2018-04-06: qty 15

## 2018-04-06 MED ORDER — LORAZEPAM 1 MG PO TABS: 1.0000 mg | ORAL_TABLET | ORAL | Status: DC | PRN

## 2018-04-06 MED ORDER — MORPHINE SULFATE (PF) 2 MG/ML IV SOLN
2.0000 mg | Freq: Once | INTRAVENOUS | Status: AC
  Administered 2018-04-06: 2 mg via INTRAVENOUS
  Filled 2018-04-06: qty 1

## 2018-04-06 MED ORDER — MORPHINE SULFATE (PF) 2 MG/ML IV SOLN: 2.0000 mg | INTRAVENOUS | Status: DC | PRN

## 2018-04-06 MED ORDER — ONDANSETRON 4 MG PO TBDP: 4.0000 mg | ORAL_TABLET | Freq: Four times a day (QID) | ORAL | 0 refills | Status: AC | PRN

## 2018-04-06 MED ORDER — GLYCOPYRROLATE 0.2 MG/ML IJ SOLN: 0.2000 mg | INTRAMUSCULAR | Status: DC | PRN

## 2018-04-06 MED ORDER — HALOPERIDOL 0.5 MG PO TABS: 0.5000 mg | ORAL_TABLET | ORAL | 0 refills | Status: AC | PRN

## 2018-04-06 MED ORDER — LORAZEPAM 1 MG PO TABS: 1.0000 mg | ORAL_TABLET | ORAL | 0 refills | Status: AC | PRN

## 2018-04-06 MED ORDER — LORAZEPAM 2 MG/ML PO CONC: 1.0000 mg | ORAL | Status: DC | PRN

## 2018-04-06 MED ORDER — HALOPERIDOL LACTATE 2 MG/ML PO CONC
0.5000 mg | ORAL | Status: DC | PRN
  Filled 2018-04-06: qty 0.3

## 2018-04-06 MED ORDER — MORPHINE SULFATE (CONCENTRATE) 10 MG/0.5ML PO SOLN: 5.0000 mg | ORAL | Status: DC | PRN

## 2018-04-06 MED ORDER — GLYCOPYRROLATE 1 MG PO TABS: 1.0000 mg | ORAL_TABLET | ORAL | 6 refills | Status: AC | PRN

## 2018-04-06 MED ORDER — ONDANSETRON HCL 4 MG/2ML IJ SOLN: 4.0000 mg | Freq: Four times a day (QID) | INTRAMUSCULAR | Status: DC | PRN

## 2018-04-06 MED ORDER — DOCUSATE SODIUM 100 MG PO CAPS: 200.0000 mg | ORAL_CAPSULE | Freq: Two times a day (BID) | ORAL | 0 refills | Status: AC | PRN

## 2018-04-06 MED ORDER — DIPHENHYDRAMINE HCL 25 MG PO CAPS: 25.0000 mg | ORAL_CAPSULE | Freq: Every evening | ORAL | 0 refills | Status: AC | PRN

## 2018-04-06 MED ORDER — HALOPERIDOL 0.5 MG PO TABS: 0.5000 mg | ORAL_TABLET | ORAL | Status: DC | PRN

## 2018-04-06 MED ORDER — INSULIN GLARGINE 100 UNIT/ML ~~LOC~~ SOLN: 5.0000 [IU] | Freq: Every day | SUBCUTANEOUS | 11 refills | Status: AC

## 2018-04-06 MED ORDER — BIOTENE DRY MOUTH MT LIQD: 15.0000 mL | OROMUCOSAL | Status: DC | PRN

## 2018-04-06 MED ORDER — HALOPERIDOL LACTATE 5 MG/ML IJ SOLN: 0.5000 mg | INTRAMUSCULAR | Status: DC | PRN

## 2018-04-06 MED ORDER — MORPHINE SULFATE (CONCENTRATE) 10 MG/0.5ML PO SOLN: 5.0000 mg | ORAL | Status: AC | PRN

## 2018-04-06 MED ORDER — MORPHINE SULFATE (PF) 2 MG/ML IV SOLN: 1.0000 mg | INTRAVENOUS | Status: DC | PRN

## 2018-04-06 MED ORDER — LORAZEPAM 2 MG/ML IJ SOLN: 1.0000 mg | INTRAMUSCULAR | Status: DC | PRN

## 2018-04-06 MED ORDER — ONDANSETRON 4 MG PO TBDP
4.0000 mg | ORAL_TABLET | Freq: Four times a day (QID) | ORAL | Status: DC | PRN
  Administered 2018-04-06: 4 mg via ORAL
  Filled 2018-04-06: qty 1

## 2018-04-06 MED ORDER — PANTOPRAZOLE SODIUM 40 MG PO TBEC: 40.0000 mg | DELAYED_RELEASE_TABLET | Freq: Every day | ORAL | 6 refills | Status: AC

## 2018-04-06 MED ORDER — CYCLOSPORINE 0.05 % OP EMUL: 1.0000 [drp] | Freq: Two times a day (BID) | OPHTHALMIC | 6 refills | Status: AC

## 2018-04-06 NOTE — Progress Notes (Signed)
   04/06/18 1200  Clinical Encounter Type  Visited With Patient;Patient and family together  Visit Type Initial  Referral From Nurse  Consult/Referral To Chaplain  Spiritual Encounters  Spiritual Needs Emotional  Stress Factors  Patient Stress Factors Exhausted  Family Stress Factors Exhausted    Pt was laying on her bed  Awake. Family of brother in- law on site. Chaplain had good discussions about pt's family and  Pt. Pt complained of back problems which made it hard for more discussions. Chaplain provided emotional support through reflective listening, compassionate presence and prayer.  Suanne Minahan a Water quality scientistMusiko-Holley, E. I. du PontChaplain

## 2018-04-06 NOTE — Progress Notes (Signed)
Physical Therapy Discharge Patient Details Name: Hailey Levine MRN: [id number] DOB: 08/04/1946 Today's Date: 04/06/2018 Time:  -     Patient discharged from PT services secondary to pt transitioning to full comfort care..  Please see latest therapy progress note for current level of functioning and progress toward goals  [facility] Specialty HospitalCary Zabrina Brotherton PT [phone]      [name] 04/06/2018, 11:18 AM

## 2018-04-06 NOTE — Care Management Note (Signed)
Case Management Note Donn PieriniKristi Tsutomu Barfoot RN, BSN Unit 4E-Case Manager-- 2H coverage [phone]  Patient Details  Name: Hailey JonesMarilyn P Malter MRN: [id number] Date of Birth: 10/25/1946  Subjective/Objective:  Pt admitted with NSTEMI, acute HF, ischemic right lower ext. S/p Angioplasty and stenting of left common iliac stenosis with VBX stent Left to right femorofemoral bypass   Action/Plan: PTA pt lived at home PT to eval - CM will follow for transition of care needs.   Expected Discharge Date:  04/06/18               Expected Discharge Plan:  Skilled Nursing Facility  In-House Referral:  Clinical Social Work  Discharge planning Services  CM Consult  Post Acute Care Choice:  NA Choice offered to:  NA  DME Arranged:    DME Agency:     HH Arranged:    HH Agency:     Status of Service:  Completed, signed off  If discussed at [employer] Length of Stay Meetings, dates discussed:    Discharge Disposition: Hospice facility   Additional Comments:  04/06/18- 1600- Donn PieriniKristi Betty Brooks RN, CM- noted pt's plan to transition to comfort care- CVVHD stopped 04/03/18- CSW assisting in [facility].   04/02/18- 1000- Laresa Oshiro RN, CM- pt continues on CVVHD- per MD pt would not be a candidate for long term HD. May need to look at transitioning to comfort care if pt does not respond to CVVHD. CM and CSW to continue to follow- PT recommendation for SNF  Darrold SpanWebster, Jaeleah Smyser Hall, RN 04/06/2018, 4:18 PM

## 2018-04-06 NOTE — Progress Notes (Signed)
Patient ID: Hailey Levine, female   DOB: 10/22/1946, 72 y.o.   MRN: [id number] [facility] Progress Note   Assessment/ Plan:   1.  Acute kidney injury on chronic kidney disease stage IV: Suspected to be acute on chronic cardiorenal/contrast-induced nephropathy.  With transient need for CRRT/ultrafiltration after failing intravenous diuretics-CRRT discontinued 72 hours ago after achieving her dry weight/CVP goal.  She does not have any urine output and BUN/creatinine rising.  She is not a candidate for OP dialysis with poor EF.  Discussed with pt today that her life expectancy would be measured in days to a week or so  If this trajectory continues.  Greatly appreciate palliative care help.  Pt expresses desire for likely hospice services.  In this setting, we will sign off.  Please let us know if we can be of service.   2.  CHF exacerbation: With history of ischemic cardiomyopathy/systolic heart failure (EF 20-25%) and successful volume unloading.  Continue to follow/monitor urine output at this time. 3.  Cardiogenic shock: Successful volume unloading with CRRT, remains on Levophed and milrinone. 4.  Right leg ischemia: Postop day #10, status post percutaneous transluminal angioplasty/stenting of left iliac stenosis and left to right femoral-femoral bypass with continued management by vascular surgery. 5.  Anemia: Likely secondary to chronic kidney disease and recent surgical losses- stable s/p PRBC transfusion.  6.  Atrial fibrillation with rapid ventricular response: Currently appears to be rate controlled on amiodarone drip.  Subjective:    No urine output.  Back and sacral area really hurting.  Doesn't want to be hooked up to machines any longer. Bladder scans with minimal output.      Objective:   BP (!) 113/94 (BP Location: Left Arm)   Pulse 81   Temp 97.7 F (36.5 C) (Oral)   Resp 16   Ht 4\' 9"  (1.448 m)   Wt 77.8 kg (171 lb 8.3 oz)   SpO2 92%   BMI 37.12 kg/m    Intake/Output Summary (Last 24 hours) at 04/06/2018 0915 Last data filed at 04/06/2018 0600 Gross per 24 hour  Intake 556.6 ml  Output 1 ml  Net 555.6 ml   Weight change:   Physical Exam: Gen: lying in bed, appears slightly uncomfortable CVS: Pulse regular rhythm, normal rate, S1 and S2 normal Resp: Anteriorly clear to auscultation bilaterally, no rales, no distinct rhonchi Abd: Soft, obese, nontender Ext: 1+ edema over lower extremities  Imaging: No results found.  Labs: BMET Recent Labs  Lab 04/03/18 0353 04/03/18 1708 04/04/18 0424 04/04/18 1842 04/05/18 0544 04/05/18 1600 04/06/18 0419  NA 134* 132* 131* 129* 129* 126* 126*  K 3.9 3.7 3.6 3.8 4.0 4.1 4.3  CL 99* 97* 96* 94* 93* 92* 91*  CO2 24 23 23 22 22  21* 21*  GLUCOSE 209* 232* 191* 185* 199* 172* 170*  BUN 6 10 15  21* 25* 31* 34*  CREATININE 1.13* 1.72* 2.37* 3.21* 3.85* 4.38* 4.90*  CALCIUM 8.0* 8.1* 8.1* 8.1* 8.4* 8.1* 8.2*  PHOS 1.8* 1.4* 1.9* 2.1* 2.7 2.6 3.4   CBC Recent Labs  Lab 04/01/18 0427 04/04/18 0424 04/05/18 0544 04/06/18 0419  WBC 8.4 8.0 7.6 8.0  HGB 10.0* 8.8* 8.9* 7.8*  HCT 31.5* 27.8* 27.7* 23.8*  MCV 92.4 91.1 91.4 90.2  PLT 162 124* 127* 137*    Medications:    . aspirin EC  81 mg Oral Daily  . atorvastatin  40 mg Oral Daily  . Chlorhexidine Gluconate Cloth  6  each Topical Daily  . cycloSPORINE  1 drop Both Eyes BID  . fluticasone  1 spray Each Nare Daily  . insulin aspart  0-15 Units Subcutaneous TID WC  . insulin aspart  0-5 Units Subcutaneous QHS  . insulin glargine  5 Units Subcutaneous QHS  . mouth rinse  15 mL Mouth Rinse BID  . pantoprazole  40 mg Oral Daily  . sodium chloride flush  10-40 mL Intracatheter Q12H   Bufford Buttner, MD pgr [phone] 04/06/2018, 9:15 AM

## 2018-04-06 NOTE — Progress Notes (Addendum)
Daily Progress Note   Patient Name: Hailey Levine       Date: 04/06/2018 DOB: 09/28/1946  Age: 72 y.o. MRN#: [id number] Attending Physician: Angelia Mould, * Primary Care Physician: Glenford Bayley, DO Admit Date: 03/20/2018  Reason for Consultation/Follow-up: Establishing goals of care and Terminal Care  Subjective: Met with patient, her friends, and family member- Hailey Levine at bedside.  Patient relayed to me that she is at end of life and has expected prognosis of 7-10 days per her discussion with cardiology and nephrology. She tells me her main [place] is to not be in pain. She has pain in her lower back, mostly from lying in the bed. She received 70m IV morphine and this provided her relief. She is at peace with dying, and is happy to have her friends with her. She has two sisters who are coming to be with her, and her brother in law Hailey Levine has been very supportive during this hospitalization.  We discussed her EOL wishes and goals. If it were possible for her to be at home for EOL she would rather go home to die. However, she notes that she doesn't have support. She and family agree that going to residential Hospice is in her best interest and aligns with her GOC of comfort and dignity at end of life.  After much discussion regarding logistics and home support- with hospice liaison, social work, patient and bro in lSports coach Hailey Levine it was determined that instead of trying to go home- her brother Hailey Rinkswill take pictures and Facetime with her from her home so she can see it.   Review of Systems  Constitutional: Positive for malaise/fatigue.  Respiratory: Positive for cough.   Musculoskeletal: Positive for back pain.  Neurological: Positive for weakness.  Psychiatric/Behavioral: The  patient is not nervous/anxious.     Length of Stay: 17  Current Medications: Scheduled Meds:  . aspirin EC  81 mg Oral Daily  . Chlorhexidine Gluconate Cloth  6 each Topical Daily  . cycloSPORINE  1 drop Both Eyes BID  . fluticasone  1 spray Each Nare Daily  . insulin glargine  5 Units Subcutaneous QHS  . mouth rinse  15 mL Mouth Rinse BID  . pantoprazole  40 mg Oral Daily  . sodium chloride flush  10-40 mL Intracatheter  Q12H    Continuous Infusions: . sodium chloride Stopped (04/03/18 1315)    PRN Meds: albuterol, antiseptic oral rinse, diphenhydrAMINE, docusate sodium, glycopyrrolate **OR** glycopyrrolate **OR** glycopyrrolate, guaiFENesin-dextromethorphan, haloperidol **OR** haloperidol **OR** haloperidol lactate, LORazepam **OR** LORazepam **OR** LORazepam, morphine injection, morphine CONCENTRATE, nitroGLYCERIN, ondansetron **OR** ondansetron (ZOFRAN) IV, polyvinyl alcohol, sodium chloride flush  Physical Exam  Constitutional: She is oriented to person, place, and time. She appears well-developed and well-nourished.  Cardiovascular: Normal rate and regular rhythm.  Pulmonary/Chest: Effort normal.  Abdominal: Soft.  Neurological: She is alert and oriented to person, place, and time.  Skin: Skin is warm and dry.  Psychiatric: She has a normal mood and affect. Her behavior is normal. Judgment and thought content normal.  Nursing note and vitals reviewed.           Vital Signs: BP (!) 113/94 (BP Location: Left Arm)   Pulse 89   Temp 98 F (36.7 C) (Oral)   Resp 18   Ht '4\' 9"'  (1.448 m)   Wt 77.8 kg (171 lb 8.3 oz)   SpO2 98%   BMI 37.12 kg/m  SpO2: SpO2: 98 % O2 Device: O2 Device: Room Air O2 Flow Rate: O2 Flow Rate (L/min): 2 L/min  Intake/output summary:   Intake/Output Summary (Last 24 hours) at 04/06/2018 1438 Last data filed at 04/06/2018 0600 Gross per 24 hour  Intake 142.8 ml  Output 1 ml  Net 141.8 ml   LBM: Last BM Date: 04/05/18 Baseline Weight:  Weight: 70.8 kg (156 lb 1.4 oz) Most recent weight: Weight: 77.8 kg (171 lb 8.3 oz)       Palliative Assessment/Data: PPS: 30%   Flowsheet Rows     Most Recent Value  Intake Tab  Referral Department  Cardiology  Unit at Time of Referral  ICU  Palliative Care Primary Diagnosis  Nephrology  Date Notified  04/03/18  Palliative Care Type  New Palliative care  Reason for referral  Clarify Goals of Care  Date of Admission  03/20/18  Date first seen by Palliative Care  04/03/18  # of days Palliative referral response time  0 Day(s)  # of days IP prior to Palliative referral  14  Clinical Assessment  Palliative Performance Scale Score  40%  Psychosocial & Spiritual Assessment  Palliative Care Outcomes  Patient/Family meeting held?  Yes  Who was at the meeting?  patient and brother-in-law, Hailey Levine  Palliative Care Outcomes  Clarified goals of care, Provided psychosocial or spiritual support, Changed CPR status, ACP counseling assistance, Counseled regarding hospice, Provided advance care planning  Patient/Family wishes: Interventions discontinued/not started   Mechanical Ventilation, Hemodialysis, Trach, PEG  Palliative Care follow-up planned  Yes, Facility      Patient Active Problem List   Diagnosis Date Noted  . Goals of care, counseling/discussion   . Palliative care by specialist   . Ischemic leg 03/26/2018  . Acute on chronic combined systolic and diastolic CHF (congestive heart failure) ([place]) 03/21/2018  . NSTEMI (non-ST elevated myocardial infarction) ([place]) 03/20/2018  . Chronic systolic heart failure ([place]) 03/12/2018  . Cough variant asthma 03/02/2018  . Dyspnea on exertion 03/02/2018  . Diabetes mellitus type 2 in obese ([place]) 01/30/2017  . CKD stage 3 due to type 2 diabetes mellitus ([place]) 01/30/2017  . Chest pain 01/29/2017  . Gout 02/13/2016  . History of carotid angioplasty 02/13/2016  . Hx of CABG 02/13/2016  . CKD (chronic kidney disease), stage III ([place])  02/13/2016  . Type 2 diabetes mellitus with  stage 3 chronic kidney disease and hypertension ([place]) 02/13/2016  . 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 Coronary artery disease involving coronary bypass graft of native heart with angina pectoris ([place]) 02/28/2014  . Essential hypertension 02/28/2014  . Hyperlipidemia 02/28/2014  . Chronic coronary artery disease 02/28/2014    Palliative Care Assessment & Plan   Patient Profile: 72 y.o. female  with past medical history of CAD s/p CABG 2002, stent 2003, HF (EF 20-25%), HTN, HLD, DM, and CKD  admitted on 03/20/2018 with chest pain - NSTEMI. Hospital course complicated with acute systolic heart failure leading to cardiogenic shock and acute renal failure. Pt currently in ICU on pressor support. She was on CVVHD 4/7-4/12. Per nephrology, no evidence of renal recovery and she would not tolerate outpatient hemodialysis d/t HF. Had conversation with nephrology and cardiology 4/15. Transitioned to comfort care only.  Assessment/Recommendations/Plan   Consult to social work for residential Hospice placement- family requests [facility]- I spoke with [facility] liaison and they have beds available, therefore if patient approved can likely be d/c'd today. Will cancel transfer in hope of discharge straight to [facility]. Greatly appreciate SW- Jenna's assistance today.  Comfort medications entered as ordered- ideally would avoid morphine with end stage renal patient- but this patient has severe intolerance to oxycodone- anticipate need for morphine will be short lived, had exceptional relief with first dose- so will continue to use for pain and SOB relief  Morphine 41m IV q2hr prn severe pain  Morphine concentrate 114m0.48mL - 48m70m2hr prn moderate pain  Lorazepam 1mg42m/iv/sL q4hrs prn anxiety  Durable DNR form placed on chart  Goals of Care and Additional Recommendations:  Limitations on Scope of Treatment: Full Comfort Care and Minimize Medications  Code  Status:  DNR  Prognosis:   < 2 weeks d/t end stage heart and renal failure, transition to full comfort care  Discharge Planning:  Hospice facility  Care plan was discussed with patient,  brother in law- JameWaymon AmatoSW; MaryEdwin Cap- [place]gank you for allowing the Palliative Medicine Team to assist in the care of this patient.   Time In: 1330 Time Out: 1500 Total Time 90 mins Prolonged Time Billed Yes      Greater than 50%  of this time was spent counseling and coordinating care related to the above assessment and plan.  KasiMariana KaufmanNP-C Palliative Medicine   Please contact Palliative Medicine Team phone at [phone] questions and concerns.

## 2018-04-06 NOTE — Progress Notes (Signed)
   VASCULAR SURGERY ASSESSMENT & PLAN:   11 Days Post-Op s/p: Left iliac artery angioplasty and stenting in left to right femorofemoral bypass  Her feet are warm and well perfused.  Her incisions look fine.  Vascular surgery will be available as needed.  I will arrange follow-up as an outpatient.  No complaints.  SUBJECTIVE:   No complaints.  PHYSICAL EXAM:   Vitals:   04/06/18 0600 04/06/18 0700 04/06/18 0759 04/06/18 0800  BP: (!) 115/48 (!) 115/51  (!) 113/94  Pulse: 87 82  81  Resp: 16 15  16   Temp:   97.7 F (36.5 C)   TempSrc:   Oral   SpO2: 99% 99%  92%  Weight:    171 lb 8.3 oz (77.8 kg)  Height:       Both feet are warm and well-perfused. Her groin incisions look fine.  LABS:   Lab Results  Component Value Date   WBC 8.0 04/06/2018   HGB 7.8 (L) 04/06/2018   HCT 23.8 (L) 04/06/2018   MCV 90.2 04/06/2018   PLT 137 (L) 04/06/2018   Lab Results  Component Value Date   CREATININE 4.90 (H) 04/06/2018   Lab Results  Component Value Date   INR 1.17 03/23/2018   CBG (last 3)  Recent Labs    04/05/18 1544 04/05/18 2126 04/06/18 0800  GLUCAP 222* 144* 189*    PROBLEM LIST:    Principal Problem:   NSTEMI (non-ST elevated myocardial infarction) (HCC) Active Problems:   Coronary artery disease involving coronary bypass graft of native heart with angina pectoris (HCC)   Essential hypertension   Hyperlipidemia   Diabetes mellitus type 2 in obese (HCC)   CKD stage 3 due to type 2 diabetes mellitus (HCC)   Acute on chronic combined systolic and diastolic CHF (congestive heart failure) (HCC)   Ischemic leg   Goals of care, counseling/discussion   Palliative care by specialist   CURRENT MEDS:   . aspirin EC  81 mg Oral Daily  . atorvastatin  40 mg Oral Daily  . Chlorhexidine Gluconate Cloth  6 each Topical Daily  . cycloSPORINE  1 drop Both Eyes BID  . fluticasone  1 spray Each Nare Daily  . insulin aspart  0-15 Units Subcutaneous TID WC  .  insulin aspart  0-5 Units Subcutaneous QHS  . insulin glargine  5 Units Subcutaneous QHS  . mouth rinse  15 mL Mouth Rinse BID  . pantoprazole  40 mg Oral Daily  . sodium chloride flush  10-40 mL Intracatheter Q12H    Waverly FerrariChristopher Marcelino Campos Beeper: [phone] Office: [phone] 04/06/2018

## 2018-04-06 NOTE — Progress Notes (Signed)
[facility] Anticipated discharge date: 4/15 Family notified: Fayrene FearingJames at bedside Transportation by Sharin MonsPTAR- called at 4:25pm Report#: [phone]  CSW signing off.  Burna SisJenna H. Grasiela Jonsson, LCSW Clinical Social Worker [phone]

## 2018-04-06 NOTE — Progress Notes (Addendum)
Patient ID: Hailey Levine, female   DOB: 04/01/1946, 72 y.o.   MRN: [id number]     Advanced Heart Failure Rounding Note  PCP-Cardiologist: Lesleigh NoeHenry W Smith III, MD   Subjective:    03/26/2018 S/P Angioplasty and stenting of left common iliac stenosis with VBX stent.  Left to right femorofemoral bypass.   CVVH stopped 4/12, she remains anuric.   Remains on milrinone 0.25 mcg and amio drip.   Wants to transition to full comfort.Says she would like to see her house one more time. [facility] is [facility].  Denies SOB.     Objective:   Weight Range: 171 lb 8.3 oz (77.8 kg) Body mass index is 37.12 kg/m.   Vital Signs:   Temp:  [97.7 F (36.5 C)-98.6 F (37 C)] 97.7 F (36.5 C) (04/15 0759) Pulse Rate:  [80-124] 81 (04/15 0800) Resp:  [13-21] 16 (04/15 0800) BP: (93-121)/(46-94) 113/94 (04/15 0800) SpO2:  [90 %-100 %] 92 % (04/15 0800) Weight:  [171 lb 8.3 oz (77.8 kg)] 171 lb 8.3 oz (77.8 kg) (04/15 0800) Last BM Date: 04/05/18  Weight change: Filed Weights   04/04/18 0500 04/05/18 0600 04/06/18 0800  Weight: 154 lb 8.7 oz (70.1 kg) 158 lb 8.2 oz (71.9 kg) 171 lb 8.3 oz (77.8 kg)   Intake/Output:   Intake/Output Summary (Last 24 hours) at 04/06/2018 0921 Last data filed at 04/06/2018 0600 Gross per 24 hour  Intake 556.6 ml  Output 1 ml  Net 555.6 ml    Physical Exam   General:  No resp difficulty. In bed.  HEENT: normal Neck: supple. JVP ~12. Carotids 2+ bilat; no bruits. No lymphadenopathy or thryomegaly appreciated. Cor: PMI nondisplaced. Regular rate & rhythm. No rubs, gallops or murmurs.  Lungs: clear Abdomen: soft, nontender, nondistended. No hepatosplenomegaly. No bruits or masses. Good bowel sounds. Extremities: no cyanosis, clubbing, rash, R and LLE 2+edema Neuro: alert & orientedx3, cranial nerves grossly intact. moves all 4 extremities w/o difficulty. Affect pleasant    Telemetry   A fib 80s personally reviewed.   Labs    CBC Recent Labs   04/05/18 0544 04/06/18 0419  WBC 7.6 8.0  HGB 8.9* 7.8*  HCT 27.7* 23.8*  MCV 91.4 90.2  PLT 127* 137*   Basic Metabolic Panel Recent Labs    57/84/6904/14/19 0544 04/05/18 1600 04/06/18 0419  NA 129* 126* 126*  K 4.0 4.1 4.3  CL 93* 92* 91*  CO2 22 21* 21*  GLUCOSE 199* 172* 170*  BUN 25* 31* 34*  CREATININE 3.85* 4.38* 4.90*  CALCIUM 8.4* 8.1* 8.2*  MG 2.1  --  1.9  PHOS 2.7 2.6 3.4   Liver Function Tests Recent Labs    04/05/18 1600 04/06/18 0419  ALBUMIN 2.3* 2.4*   No results for input(s): LIPASE, AMYLASE in the last 72 hours. Cardiac Enzymes No results for input(s): CKTOTAL, CKMB, CKMBINDEX, TROPONINI in the last 72 hours.  BNP: BNP (last 3 results) No results for input(s): BNP in the last 8760 hours.  ProBNP (last 3 results) Recent Labs    11/07/17 1214 03/02/18 1724 03/20/18 1534  PROBNP 11,244* 2,115.0* 62,95232,943*   D-Dimer No results for input(s): DDIMER in the last 72 hours. Hemoglobin A1C No results for input(s): HGBA1C in the last 72 hours. Fasting Lipid Panel No results for input(s): CHOL, HDL, LDLCALC, TRIG, CHOLHDL, LDLDIRECT in the last 72 hours. Thyroid Function Tests No results for input(s): TSH, T4TOTAL, T3FREE, THYROIDAB in the last 72 hours.  Invalid input(s): FREET3 Other results:  Imaging   No results found.  Medications:    Scheduled Medications: . aspirin EC  81 mg Oral Daily  . atorvastatin  40 mg Oral Daily  . Chlorhexidine Gluconate Cloth  6 each Topical Daily  . cycloSPORINE  1 drop Both Eyes BID  . fluticasone  1 spray Each Nare Daily  . insulin aspart  0-15 Units Subcutaneous TID WC  . insulin aspart  0-5 Units Subcutaneous QHS  . insulin glargine  5 Units Subcutaneous QHS  . mouth rinse  15 mL Mouth Rinse BID  . pantoprazole  40 mg Oral Daily  . sodium chloride flush  10-40 mL Intracatheter Q12H    Infusions: . sodium chloride    . sodium chloride Stopped (04/03/18 1315)  . amiodarone 60 mg/hr (04/06/18 0600)    . heparin 900 Units/hr (04/06/18 0600)  . milrinone 0.25 mcg/kg/min (04/06/18 0600)  . norepinephrine (LEVOPHED) Adult infusion Stopped (04/05/18 1000)  . sodium chloride      PRN Medications: sodium chloride, acetaminophen, albuterol, alteplase, bisacodyl, bisacodyl, diazepam, diphenhydrAMINE, docusate sodium, guaiFENesin-dextromethorphan, heparin, nitroGLYCERIN, ondansetron (ZOFRAN) IV, ondansetron (ZOFRAN) IV, sodium chloride, sodium chloride flush  Patient Profile   [name] is a 72 year old with a history of CAD S/P CABG x 3 in 2002 with stent 2003, chronic systolic heart failure, htn, hyperlipidemia, DM, and CKD.   Admitted with chest pain   Assessment/Plan   1. A/C Systolic Heart Failure -> Cardiogenic shock: EF 20-25% due to ischemic cardiomyopathy.  Initial RHC with markedly elevated biventricular filling pressures and low cardiac output. Now with multisystem organ failure.   - Now transitioned to comfort care - Stop milrinone.  - Remains anuric.   -2. ARF on CKD IV -> Started on CVVHD 03/29/18: CVVH stopped 4/12.  .Not a candidate for outpatient HD. Nephrology signed off today. Not a candidate for outpatient HD.    Remains anuric.  3. PAF: Stop amio.  4. Ischemic RLE: S/p 03/26/2018   Angioplasty and stenting of left common iliac stenosis with VBX stent. Left to right femoral-femoral bypass.  5. CAD: Severe native CAD and graft disease on cath 03/24/18. No revascularization options. No chest pain. .  6. Post op anemia: Hgb 7.8 . Received 2 uPRBCs on 4/8.  7. Hyponatremia- sodium 126.  8. DNR: Requesting comfort care. She is open to [facility]. She would like to see her house one more time.   Palliative Care following.   Requesting Hospice. Not candidate for outpatient HD. Nephrology signed off. She is at peace with comfort care.   Disposition: Transfer to [facility] [place]. Possible d/c to [facility] in 24 hours.   Amy Clegg NP-C  04/06/2018 9:21 AM   Patient seen and examined  with Tonye Becket, NP. We discussed all aspects of the encounter. I agree with the assessment and plan as stated above.    Remains anuric despite adequate co-ox. Prognosis for renal recovery is very poor. Creatinine rising. Has decided to switch to comfort care. Palliative Care involved with discuss disposition regarding [facility] or [facility]. PAD is stable s/p urgent revasc.   Arvilla Meres, MD  10:10 AM

## 2018-04-06 NOTE — Progress Notes (Signed)
[facility] Liaison: RN visit  Received request from Tammy Sours, [place] for family interest in [facility] with request to transfer today. Chart reviewed and patient approved. Met with patient and so, [name] at bedside to confirm interest and explain services. Patient and son agreeable to transfer today. Dr. Orpah Melter to assume care per patient request. Please fax discharge summary to [phone]. RN please call report to [phone]. Please arrange transport for patient to arrive as soon as possible.  Please call with any hospice related question.  Thank you for this referral.  [facility] Liaison  [phone]  [facility] hospital liaisons are on [place].

## 2018-04-06 NOTE — Discharge Summary (Addendum)
Advanced Heart Failure Team  Discharge Summary   Patient ID: Hailey Levine MRN: [id number], DOB/AGE: 74/01/1946 74 y.o. Admit date: 03/20/2018 D/C date:     04/06/2018   Primary Discharge Diagnoses:  1.A/C Systolic Heart Failure-> cardiogenic shock  2. 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 PAF 4. Ischemic RLE .5. CAD: Severe native CAD and graft disease on cath 03/24/18. No revascularization options. 6. Post op anemia: Hgb 7.8 . Received 2 uPRBCs on 4/8.  7. Hyponatremia- sodium 126.  8. DNR: Hailey Levine DNR Form Completed.   Hospital Course:  [name] is a 72 year old with a history of CAD S/P CABG x 3 in 2002 with stent 2003, chronic systolic heart failure, htn, hyperlipidemia, DM, and CKD.   Admitted with chest pain. Troponin was 1.1. She underwent RHC/LHC with elevated filling pressures, low cardiac output, and severe 3 vessel CAD with 2/3 SVGs occluded. There were no revascularization options.  HF team was consulted and she was diuresed with IV lasix and placed on milrinone for reduced mixed venous saturations.   On April 4th she complained of RLE pain. ABI obtained due to ischemia RLE and there was  absence of flow to RLE. Vascular consulted and she underwent angioplasty and stenting of left common iliac stenosis and left -right fem-fem bypass.   Unfortunately she had worsening renal function. Nephrology consulted and started CVVHD. This was later stopped and unfortunately she has no sign of renal recovery. She was not a candidate for iHD.   Palliative Care consulted and she elected to transition to comfort care. She will be transported to [facility] with PICC line in place.    1. A/C Systolic Heart Failure-> Cardiogenic shock:  EF 20-25% due to ischemic cardiomyopathy.  Initial RHC with markedly elevated biventricular filling pressures and low cardiac output.  Diuresed with IV lasix with poor response and worsening renal function requiring short term dialysis. .  Place on milrinone for  severely reduced mixed venous saturation. Milrinone was stopped today.   2. ARF on CKD IV -> Started on CVVHD 03/29/18:  CVVH stopped 4/12.  Not a candidate for outpatient HD. Remains anuric.  3. PAF:  On IV amio and heparin but this was stopped prior to discharged.  4. Ischemic RLE:  S/P 03/26/2018   Angioplasty and stenting of left common iliac stenosis withVBX stent. Left to right femoral-femoral bypass.  5. CAD:  Severe native CAD and graft disease on cath 03/24/18. No revascularization options. 6. Post op anemia:  Hgb 7.8 . Received 2 uPRBCs on 4/8.  7. Hyponatremia- sodium 126.  8. DNR:  Requesting comfort care. She is open to [facility]. She would like to see her house one more time.   Consultants: Nephrology, Vascular Surgery, and Palliative Care.   RHC/LHC 03/24/2018  EF 20-25% Grade IIDD Peak PA pressure 60 mm hg  Ost Cx to Prox Cx lesion is 55% stenosed.  Prox RCA to Mid RCA lesion is 100% stenosed.  Ost 2nd Diag to 2nd Diag lesion is 75% stenosed.  Mid LAD lesion is 100% stenosed.  Prox LAD lesion is 80% stenosed.  RPDA lesion is 99% stenosed.  Origin lesion is 100% stenosed.  Origin to Prox Graft lesion is 100% stenosed.  Hemodynamic findings consistent with severe pulmonary hypertension. RA: A-wave 27, V-wave 25; mean 23 RV: 86/30 PA: 82/38; mean 55 PW: A-wave 32, V-wave 38; mean 31 AO: 154/61 PA: 83/35 LV: 162/32 PW: A-wave 35, V-wave 39; mean 34 LV: 165/35 AO:  169/73 Oxygen saturation in the aorta was 93% and in the pulmonary artery was 48%. Cardiac output by the thermodilution  2.8 Cardiac index by the thermodilution  1.8 PVR: 8.48 Woods units with HRU index 13.74   Discharge Vitals: Blood pressure (!) 113/94, pulse 89, temperature 98 F (36.7 C), temperature source Oral, resp. rate 18, height 4\' 9"  (1.448 m), weight 171 lb 8.3 oz (77.8 kg), SpO2 98 %.  Labs: Lab Results  Component Value Date   WBC 8.0 04/06/2018   HGB 7.8 (L) 04/06/2018   HCT  23.8 (L) 04/06/2018   MCV 90.2 04/06/2018   PLT 137 (L) 04/06/2018    Recent Labs  Lab 04/06/18 0419  NA 126*  K 4.3  CL 91*  CO2 21*  BUN 34*  CREATININE 4.90*  CALCIUM 8.2*  GLUCOSE 170*   Lab Results  Component Value Date   CHOL 138 03/21/2018   HDL 51 03/21/2018   LDLCALC 69 03/21/2018   TRIG 90 03/21/2018   BNP (last 3 results) No results for input(s): BNP in the last 8760 hours.  ProBNP (last 3 results) Recent Labs    11/07/17 1214 03/02/18 1724 03/20/18 1534  PROBNP 11,244* 2,115.0* 16,109*     Diagnostic Studies/Procedures   No results found.  Discharge Medications   Allergies as of 04/06/2018      Reactions   Codeine Shortness Of Breath   Coreg [carvedilol] Shortness Of Breath, Cough   Metoprolol Nausea And Vomiting   "Throws up white, foamy liquid"   Percocet [oxycodone-acetaminophen] Nausea And Vomiting   Hydrocodone Cough   This was in a cough syrup that made her cough WORSE   Benazepril Cough      Medication List    STOP taking these medications   albuterol 108 (90 Base) MCG/ACT inhaler Commonly known as:  PROVENTIL HFA;VENTOLIN HFA   atorvastatin 40 MG tablet Commonly known as:  LIPITOR   bisoprolol 5 MG tablet Commonly known as:  ZEBETA   furosemide 80 MG tablet Commonly known as:  LASIX   glimepiride 1 MG tablet Commonly known as:  AMARYL   isosorbide mononitrate 30 MG 24 hr tablet Commonly known as:  IMDUR   metFORMIN 500 MG 24 hr tablet Commonly known as:  GLUCOPHAGE-XR   OMEGA 3 PO   probenecid 500 MG tablet Commonly known as:  BENEMID   sacubitril-valsartan 24-26 MG Commonly known as:  ENTRESTO     TAKE these medications   aspirin 81 MG tablet Take 81 mg by mouth daily.   cycloSPORINE 0.05 % ophthalmic emulsion Commonly known as:  RESTASIS Place 1 drop into both eyes 2 (two) times daily.   diphenhydrAMINE 25 mg capsule Commonly known as:  BENADRYL Take 1 capsule (25 mg total) by mouth at bedtime as  needed for sleep.   docusate sodium 100 MG capsule Commonly known as:  COLACE Take 2 capsules (200 mg total) by mouth 2 (two) times daily as needed for mild constipation.   fluticasone 50 MCG/ACT nasal spray Commonly known as:  FLONASE Place 1 spray into both nostrils at bedtime.   glycopyrrolate 1 MG tablet Commonly known as:  ROBINUL Take 1 tablet (1 mg total) by mouth every 4 (four) hours as needed (excessive secretions).   haloperidol 0.5 MG tablet Commonly known as:  HALDOL Take 1 tablet (0.5 mg total) by mouth every 4 (four) hours as needed for agitation (or delirium).   insulin glargine 100 UNIT/ML injection Commonly known as:  LANTUS Inject  0.05 mLs (5 Units total) into the skin at bedtime.   LORazepam 1 MG tablet Commonly known as:  ATIVAN Take 1 tablet (1 mg total) by mouth every 4 (four) hours as needed for anxiety.   morphine CONCENTRATE 10 MG/0.5ML Soln concentrated solution Place 0.25 mLs (5 mg total) under the tongue every 2 (two) hours as needed for severe pain.   nitroGLYCERIN 0.4 MG SL tablet Commonly known as:  NITROSTAT Place 0.4 mg under the tongue every 5 (five) minutes x 3 doses as needed. (chest pain)   ondansetron 4 MG disintegrating tablet Commonly known as:  ZOFRAN-ODT Take 1 tablet (4 mg total) by mouth every 6 (six) hours as needed for nausea.   pantoprazole 40 MG tablet Commonly known as:  PROTONIX Take 1 tablet (40 mg total) by mouth daily. Start taking on:  04/15/18       Disposition   The patient will be discharged in stable condition to home. Discharge Instructions    Diet - low sodium heart healthy   Complete by:  As directed    Increase activity slowly   Complete by:  As directed          Duration of Discharge Encounter: Greater than 35 minutes   Signed, Amy Clegg NP-C  04/06/2018, 3:54 PM  Patient seen and examined with Tonye Becket, NP. We discussed all aspects of the encounter. I agree with the assessment and plan as  stated above.   Arvilla Meres, MD  5:13 PM

## 2018-04-06 NOTE — Progress Notes (Signed)
CSW informed that recommendation will be for transfer to residential hospice at [facility]- palliative has already placed consult with [facility] hospital liaison.  CSW assisting with planning and trying to find way for pt to return home for brief period of time prior to transfer to SNF- per palliative pt unable to walk at this time and would need transport in and out of the house- seems as if only safe route is [facility].  CSW spoke with [facility]- they can transport pt home and then from home but would need up front payment for transport- estimate would cost $1080 for both trips.  [facility] would have to leave pt at home and could not stay with pt so she would need someone there with her to assist with what she wanted to do at home.  CSW updated palliative regarding transport findings and is available to assist as needed  Burna SisJenna H. Nikeia Henkes, LCSW Clinical Social Worker [phone]

## 2018-04-06 NOTE — Progress Notes (Signed)
ANTICOAGULATION CONSULT NOTE  Pharmacy Consult for Heparin  Indication: atrial fibrillation  Allergies  Allergen Reactions  . Codeine Shortness Of Breath  . Coreg [Carvedilol] Shortness Of Breath and Cough  . Metoprolol Nausea And Vomiting    "Throws up white, foamy liquid"  . Percocet [Oxycodone-Acetaminophen] Nausea And Vomiting  . Hydrocodone Cough    This was in a cough syrup that made her cough WORSE  . Benazepril Cough   Patient Measurements: Height: 4\' 9"  (144.8 cm) Weight: 171 lb 8.3 oz (77.8 kg) IBW/kg (Calculated) : 38.6 Heparin dosing wt: 60kg  Vital Signs: Temp: 97.7 F (36.5 C) (04/15 0759) Temp Source: Oral (04/15 0759) BP: 113/94 (04/15 0800) Pulse Rate: 81 (04/15 0800)  Labs: Recent Labs    04/04/18 0424  04/05/18 0544 04/05/18 1600 04/06/18 0419  HGB 8.8*  --  8.9*  --  7.8*  HCT 27.8*  --  27.7*  --  23.8*  PLT 124*  --  127*  --  137*  HEPARINUNFRC 0.50  --  0.55  --  0.51  CREATININE 2.37*   < > 3.85* 4.38* 4.90*   < > = values in this interval not displayed.   Assessment: [age] yoF admitted with HF, s/p fem pop bypass, and now with renal failure. Heparin drip started for anticoagulation for atrial fibrillation. Heparin level this AM within goal range at 0.51. Hgb down, 7.8; pltc stable 137. No bleeding noted.   No plans to restart CRRT and not an HD candidate. Moving towards comfort care, to speak to palliative later today.   Goal of Therapy:  Heparin level 0.3-0.7 units/ml Monitor platelets by anticoagulation protocol: Yes  Plan:  Continue heparin gtt at 900 units/hr for now Daily heparin level and CBC  Monitor s/sx of bleeding F/u goals of care  Koya Hunger N. Zigmund Danieleja, PharmD PGY1 Pharmacy Resident Pager: [phone] 04/06/2018 9:44 AM

## 2018-04-07 ENCOUNTER — Other Ambulatory Visit: Payer: Self-pay

## 2018-04-07 ENCOUNTER — Other Ambulatory Visit (HOSPITAL_COMMUNITY): Payer: Self-pay | Admitting: *Deleted

## 2018-04-07 ENCOUNTER — Telehealth: Payer: Self-pay | Admitting: Vascular Surgery

## 2018-04-07 DIAGNOSIS — I998 Other disorder of circulatory system: Secondary | ICD-10-CM

## 2018-04-08 ENCOUNTER — Ambulatory Visit: Payer: Medicare HMO | Admitting: Cardiology

## 2018-04-22 NOTE — Telephone Encounter (Signed)
-----   Message from Sharee PimpleMarilyn K McChesney, RN sent at 04/06/2018  9:25 AM EDT ----- Regarding: 4-6 weeks    ----- Message ----- From: Chuck Hintickson, Christopher S, MD Sent: 04/06/2018   9:05 AM To: [facility] Charge Pool Subject: f/u and list                                   This patient can be in [  ]'s on the list.  She will need a follow-up visit with ABIs in 4-6 weeks.  Thanks  CD

## 2018-04-22 NOTE — Telephone Encounter (Signed)
LVM for pts appt 5/22 US and OV  Mld lttr

## 2018-04-22 NOTE — Telephone Encounter (Signed)
Medication require change. Insurance will only Patent examinercover Basaglar, Levemir. Please address.

## 2018-04-22 DEATH — deceased

## 2018-04-23 ENCOUNTER — Ambulatory Visit: Payer: Medicare HMO | Admitting: Interventional Cardiology

## 2018-05-13 ENCOUNTER — Inpatient Hospital Stay (HOSPITAL_COMMUNITY): Admit: 2018-05-13 | Payer: Medicare HMO

## 2018-05-13 ENCOUNTER — Ambulatory Visit: Payer: Medicare HMO | Admitting: Vascular Surgery

## 2018-05-27 ENCOUNTER — Telehealth: Payer: Self-pay | Admitting: Interventional Cardiology

## 2018-05-27 NOTE — Telephone Encounter (Signed)
New Message:    Hailey Levine wanted you to know pt passed away in April.

## 2018-07-14 IMAGING — DX DG CHEST 1V PORT
1 series · 1 of 1 positions shown · non-contrast
Comparison: March 28, 2018

CLINICAL DATA: Encounter for central line placement

EXAM:
PORTABLE CHEST 1 VIEW

[chest ap]
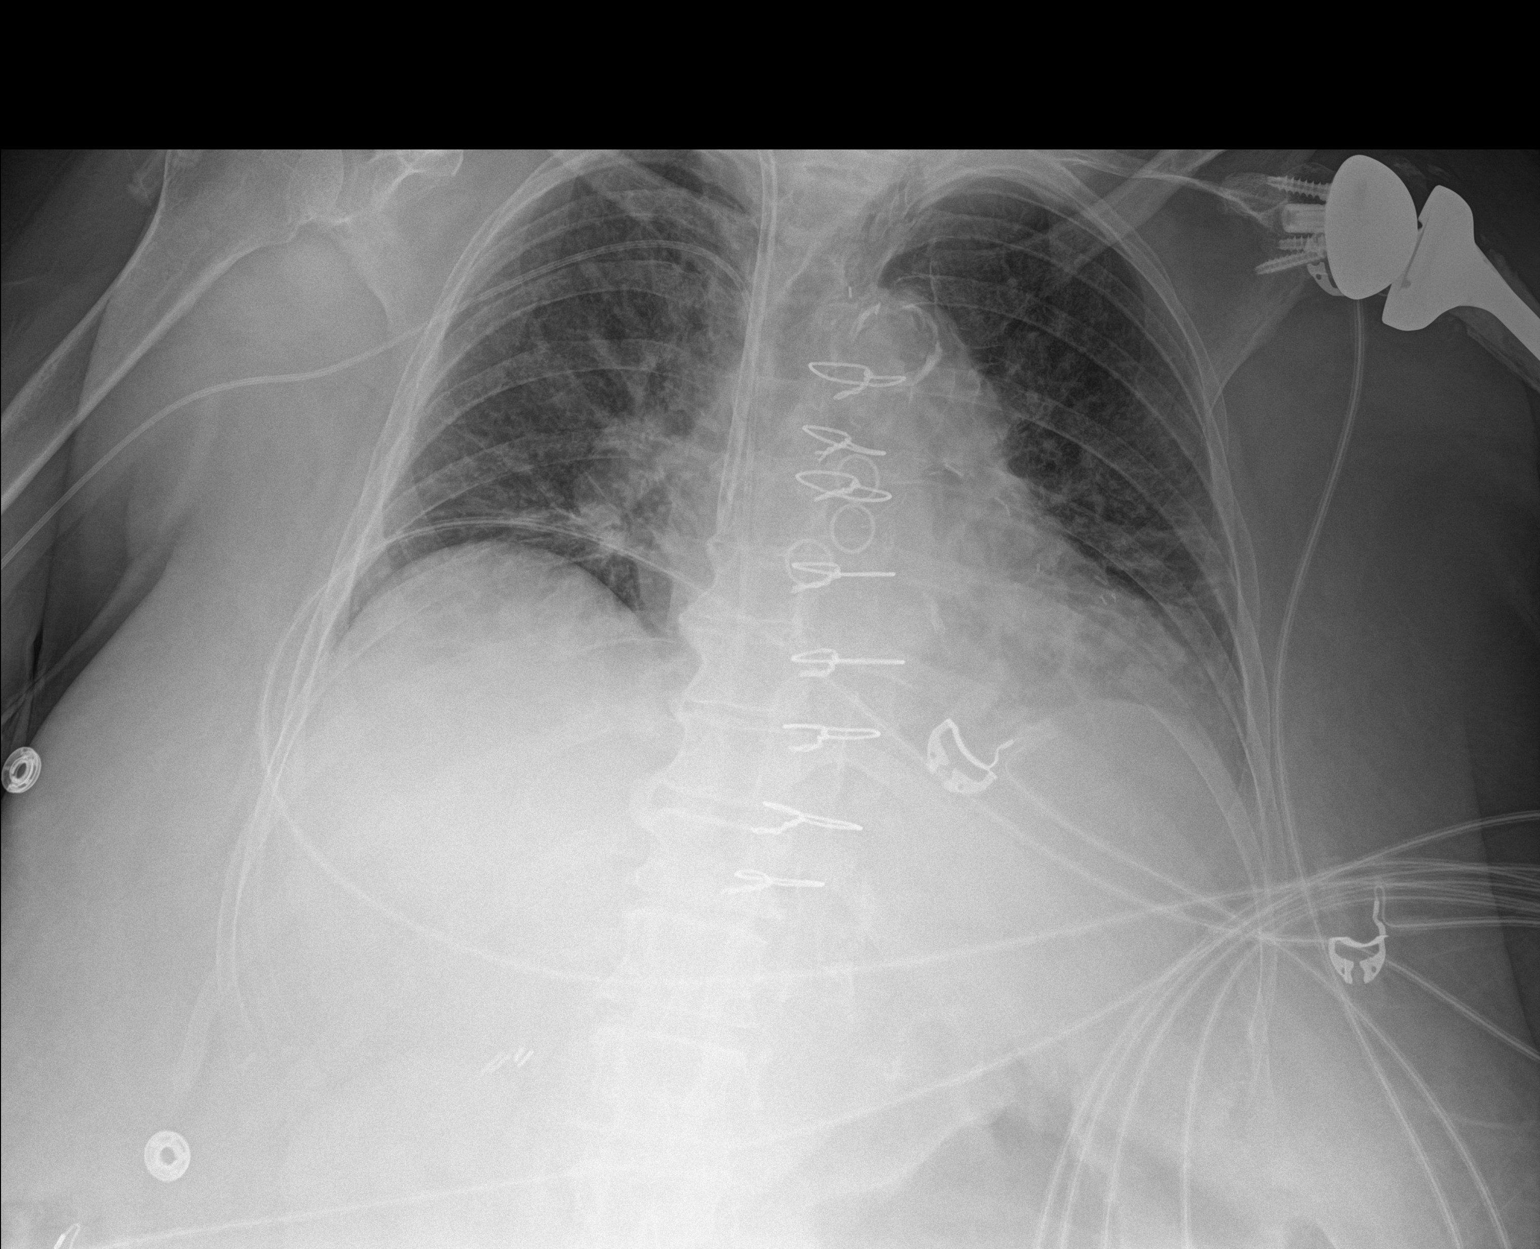

[1 of 1 positions shown; findings below may reference images not displayed]

FINDINGS: The heart size and mediastinal contours are stable. A right jugular
central venous line is identified with distal tip in the superior
vena cava right atrial junction. A right subclavian central venous
line is unchanged compared prior exam. There is no pneumothorax.
There is pulmonary edema. There is no focal pneumonia or pleural
effusion. The visualized skeletal structures are stable.
IMPRESSION: Right jugular central venous line with distal tip in the superior
vena cava/right atrial junction. There is no pneumothorax.

Pulmonary edema.
